# Patient Record
Sex: Female | Born: 1970
Health system: Southern US, Community
[De-identification: ages and names within clinical notes are randomized; demographics above are authoritative.]

## PROBLEM LIST (undated history)

## (undated) DIAGNOSIS — M549 Dorsalgia, unspecified: Secondary | ICD-10-CM

## (undated) DIAGNOSIS — G5603 Carpal tunnel syndrome, bilateral upper limbs: Secondary | ICD-10-CM

## (undated) DIAGNOSIS — M5137 Other intervertebral disc degeneration, lumbosacral region: Secondary | ICD-10-CM

## (undated) DIAGNOSIS — K08109 Complete loss of teeth, unspecified cause, unspecified class: Secondary | ICD-10-CM

## (undated) DIAGNOSIS — G43909 Migraine, unspecified, not intractable, without status migrainosus: Secondary | ICD-10-CM

## (undated) DIAGNOSIS — F319 Bipolar disorder, unspecified: Secondary | ICD-10-CM

## (undated) DIAGNOSIS — F32A Depression, unspecified: Secondary | ICD-10-CM

## (undated) DIAGNOSIS — E7800 Pure hypercholesterolemia, unspecified: Secondary | ICD-10-CM

## (undated) DIAGNOSIS — F411 Generalized anxiety disorder: Secondary | ICD-10-CM

## (undated) DIAGNOSIS — M75101 Unspecified rotator cuff tear or rupture of right shoulder, not specified as traumatic: Secondary | ICD-10-CM

## (undated) DIAGNOSIS — F39 Unspecified mood [affective] disorder: Secondary | ICD-10-CM

## (undated) DIAGNOSIS — F419 Anxiety disorder, unspecified: Secondary | ICD-10-CM

## (undated) DIAGNOSIS — G8929 Other chronic pain: Secondary | ICD-10-CM

## (undated) DIAGNOSIS — M792 Neuralgia and neuritis, unspecified: Secondary | ICD-10-CM

## (undated) DIAGNOSIS — M51379 Other intervertebral disc degeneration, lumbosacral region without mention of lumbar back pain or lower extremity pain: Secondary | ICD-10-CM

## (undated) DIAGNOSIS — Z972 Presence of dental prosthetic device (complete) (partial): Secondary | ICD-10-CM

## (undated) DIAGNOSIS — F609 Personality disorder, unspecified: Secondary | ICD-10-CM

## (undated) DIAGNOSIS — G709 Myoneural disorder, unspecified: Secondary | ICD-10-CM

## (undated) DIAGNOSIS — J189 Pneumonia, unspecified organism: Secondary | ICD-10-CM

## (undated) DIAGNOSIS — E785 Hyperlipidemia, unspecified: Secondary | ICD-10-CM

## (undated) DIAGNOSIS — M1990 Unspecified osteoarthritis, unspecified site: Secondary | ICD-10-CM

## (undated) DIAGNOSIS — F329 Major depressive disorder, single episode, unspecified: Secondary | ICD-10-CM

## (undated) DIAGNOSIS — F4310 Post-traumatic stress disorder, unspecified: Secondary | ICD-10-CM

## (undated) DIAGNOSIS — F4001 Agoraphobia with panic disorder: Secondary | ICD-10-CM

## (undated) HISTORY — PX: BREAST LUMPECTOMY: SHX2

## (undated) HISTORY — PX: DILATION AND CURETTAGE OF UTERUS: SHX78

---

## 1999-05-17 ENCOUNTER — Emergency Department (HOSPITAL_COMMUNITY): Admission: EM | Admit: 1999-05-17 | Discharge: 1999-05-17 | Payer: Self-pay | Admitting: Emergency Medicine

## 1999-05-17 ENCOUNTER — Encounter: Payer: Self-pay | Admitting: Emergency Medicine

## 2001-04-06 ENCOUNTER — Emergency Department (HOSPITAL_COMMUNITY): Admission: EM | Admit: 2001-04-06 | Discharge: 2001-04-07 | Payer: Self-pay | Admitting: Emergency Medicine

## 2001-04-19 ENCOUNTER — Emergency Department (HOSPITAL_COMMUNITY): Admission: EM | Admit: 2001-04-19 | Discharge: 2001-04-19 | Payer: Self-pay

## 2001-04-30 ENCOUNTER — Encounter: Payer: Self-pay | Admitting: Emergency Medicine

## 2001-04-30 ENCOUNTER — Ambulatory Visit (HOSPITAL_COMMUNITY): Admission: RE | Admit: 2001-04-30 | Discharge: 2001-04-30 | Payer: Self-pay | Admitting: Emergency Medicine

## 2001-10-18 ENCOUNTER — Emergency Department (HOSPITAL_COMMUNITY): Admission: EM | Admit: 2001-10-18 | Discharge: 2001-10-18 | Payer: Self-pay | Admitting: Emergency Medicine

## 2001-10-18 ENCOUNTER — Encounter: Payer: Self-pay | Admitting: Emergency Medicine

## 2002-03-15 ENCOUNTER — Emergency Department (HOSPITAL_COMMUNITY): Admission: EM | Admit: 2002-03-15 | Discharge: 2002-03-15 | Payer: Self-pay | Admitting: Emergency Medicine

## 2002-04-24 ENCOUNTER — Emergency Department (HOSPITAL_COMMUNITY): Admission: EM | Admit: 2002-04-24 | Discharge: 2002-04-24 | Payer: Self-pay | Admitting: Emergency Medicine

## 2002-04-27 ENCOUNTER — Emergency Department (HOSPITAL_COMMUNITY): Admission: EM | Admit: 2002-04-27 | Discharge: 2002-04-27 | Payer: Self-pay | Admitting: Emergency Medicine

## 2002-04-28 ENCOUNTER — Emergency Department (HOSPITAL_COMMUNITY): Admission: EM | Admit: 2002-04-28 | Discharge: 2002-04-28 | Payer: Self-pay | Admitting: Emergency Medicine

## 2002-05-21 ENCOUNTER — Emergency Department (HOSPITAL_COMMUNITY): Admission: EM | Admit: 2002-05-21 | Discharge: 2002-05-21 | Payer: Self-pay | Admitting: Emergency Medicine

## 2002-09-04 ENCOUNTER — Emergency Department (HOSPITAL_COMMUNITY): Admission: EM | Admit: 2002-09-04 | Discharge: 2002-09-04 | Payer: Self-pay | Admitting: Emergency Medicine

## 2002-09-07 ENCOUNTER — Emergency Department (HOSPITAL_COMMUNITY): Admission: EM | Admit: 2002-09-07 | Discharge: 2002-09-08 | Payer: Self-pay | Admitting: Emergency Medicine

## 2003-03-21 ENCOUNTER — Emergency Department (HOSPITAL_COMMUNITY): Admission: EM | Admit: 2003-03-21 | Discharge: 2003-03-21 | Payer: Self-pay | Admitting: Emergency Medicine

## 2003-04-07 ENCOUNTER — Emergency Department (HOSPITAL_COMMUNITY): Admission: EM | Admit: 2003-04-07 | Discharge: 2003-04-07 | Payer: Self-pay | Admitting: Emergency Medicine

## 2003-05-12 ENCOUNTER — Emergency Department (HOSPITAL_COMMUNITY): Admission: EM | Admit: 2003-05-12 | Discharge: 2003-05-13 | Payer: Self-pay | Admitting: Emergency Medicine

## 2003-07-30 ENCOUNTER — Emergency Department (HOSPITAL_COMMUNITY): Admission: EM | Admit: 2003-07-30 | Discharge: 2003-07-31 | Payer: Self-pay | Admitting: Emergency Medicine

## 2003-11-30 ENCOUNTER — Emergency Department (HOSPITAL_COMMUNITY): Admission: EM | Admit: 2003-11-30 | Discharge: 2003-11-30 | Payer: Self-pay | Admitting: Emergency Medicine

## 2003-12-14 ENCOUNTER — Emergency Department (HOSPITAL_COMMUNITY): Admission: EM | Admit: 2003-12-14 | Discharge: 2003-12-14 | Payer: Self-pay | Admitting: Emergency Medicine

## 2004-11-01 ENCOUNTER — Inpatient Hospital Stay (HOSPITAL_COMMUNITY): Admission: AD | Admit: 2004-11-01 | Discharge: 2004-11-01 | Payer: Self-pay | Admitting: Obstetrics and Gynecology

## 2004-11-11 ENCOUNTER — Inpatient Hospital Stay (HOSPITAL_COMMUNITY): Admission: AD | Admit: 2004-11-11 | Discharge: 2004-11-11 | Payer: Self-pay | Admitting: Obstetrics and Gynecology

## 2004-11-14 ENCOUNTER — Inpatient Hospital Stay (HOSPITAL_COMMUNITY): Admission: AD | Admit: 2004-11-14 | Discharge: 2004-11-16 | Payer: Self-pay | Admitting: Obstetrics and Gynecology

## 2005-06-11 ENCOUNTER — Emergency Department (HOSPITAL_COMMUNITY): Admission: EM | Admit: 2005-06-11 | Discharge: 2005-06-11 | Payer: Self-pay | Admitting: Emergency Medicine

## 2005-10-09 HISTORY — PX: BREAST EXCISIONAL BIOPSY: SUR124

## 2006-03-08 ENCOUNTER — Encounter (INDEPENDENT_AMBULATORY_CARE_PROVIDER_SITE_OTHER): Payer: Self-pay | Admitting: *Deleted

## 2006-03-08 ENCOUNTER — Encounter: Admission: RE | Admit: 2006-03-08 | Discharge: 2006-03-08 | Payer: Self-pay | Admitting: Family Medicine

## 2006-03-14 ENCOUNTER — Ambulatory Visit (HOSPITAL_COMMUNITY): Admission: RE | Admit: 2006-03-14 | Discharge: 2006-03-14 | Payer: Self-pay | Admitting: General Surgery

## 2006-03-14 ENCOUNTER — Encounter: Admission: RE | Admit: 2006-03-14 | Discharge: 2006-03-14 | Payer: Self-pay | Admitting: General Surgery

## 2006-03-14 ENCOUNTER — Encounter (INDEPENDENT_AMBULATORY_CARE_PROVIDER_SITE_OTHER): Payer: Self-pay | Admitting: *Deleted

## 2006-03-14 HISTORY — PX: BREAST EXCISIONAL BIOPSY: SUR124

## 2006-04-05 ENCOUNTER — Emergency Department (HOSPITAL_COMMUNITY): Admission: EM | Admit: 2006-04-05 | Discharge: 2006-04-06 | Payer: Self-pay | Admitting: Emergency Medicine

## 2006-05-09 ENCOUNTER — Emergency Department (HOSPITAL_COMMUNITY): Admission: EM | Admit: 2006-05-09 | Discharge: 2006-05-09 | Payer: Self-pay | Admitting: Emergency Medicine

## 2006-07-06 ENCOUNTER — Ambulatory Visit: Payer: Self-pay | Admitting: Family Medicine

## 2006-07-26 ENCOUNTER — Ambulatory Visit: Payer: Self-pay | Admitting: Family Medicine

## 2006-08-01 ENCOUNTER — Encounter: Admission: RE | Admit: 2006-08-01 | Discharge: 2006-08-01 | Payer: Self-pay | Admitting: Family Medicine

## 2006-08-20 ENCOUNTER — Ambulatory Visit: Payer: Self-pay | Admitting: Family Medicine

## 2006-10-09 DIAGNOSIS — T8859XA Other complications of anesthesia, initial encounter: Secondary | ICD-10-CM

## 2006-10-09 HISTORY — DX: Other complications of anesthesia, initial encounter: T88.59XA

## 2006-10-22 ENCOUNTER — Ambulatory Visit: Payer: Self-pay | Admitting: Family Medicine

## 2006-11-06 ENCOUNTER — Ambulatory Visit (HOSPITAL_COMMUNITY): Admission: RE | Admit: 2006-11-06 | Discharge: 2006-11-06 | Payer: Self-pay | Admitting: Obstetrics

## 2006-11-12 ENCOUNTER — Ambulatory Visit (HOSPITAL_COMMUNITY): Admission: RE | Admit: 2006-11-12 | Discharge: 2006-11-12 | Payer: Self-pay | Admitting: Obstetrics

## 2006-11-12 ENCOUNTER — Encounter (INDEPENDENT_AMBULATORY_CARE_PROVIDER_SITE_OTHER): Payer: Self-pay | Admitting: *Deleted

## 2006-11-12 HISTORY — PX: DILATION AND CURETTAGE OF UTERUS: SHX78

## 2006-12-03 ENCOUNTER — Ambulatory Visit: Payer: Self-pay | Admitting: Family Medicine

## 2006-12-09 ENCOUNTER — Emergency Department (HOSPITAL_COMMUNITY): Admission: EM | Admit: 2006-12-09 | Discharge: 2006-12-09 | Payer: Self-pay | Admitting: Emergency Medicine

## 2006-12-17 ENCOUNTER — Emergency Department (HOSPITAL_COMMUNITY): Admission: EM | Admit: 2006-12-17 | Discharge: 2006-12-18 | Payer: Self-pay | Admitting: Emergency Medicine

## 2006-12-24 ENCOUNTER — Ambulatory Visit: Payer: Self-pay | Admitting: Family Medicine

## 2007-01-08 ENCOUNTER — Emergency Department (HOSPITAL_COMMUNITY): Admission: EM | Admit: 2007-01-08 | Discharge: 2007-01-09 | Payer: Self-pay | Admitting: Emergency Medicine

## 2007-01-09 ENCOUNTER — Inpatient Hospital Stay (HOSPITAL_COMMUNITY): Admission: AD | Admit: 2007-01-09 | Discharge: 2007-01-11 | Payer: Self-pay | Admitting: Obstetrics & Gynecology

## 2007-01-11 ENCOUNTER — Encounter (INDEPENDENT_AMBULATORY_CARE_PROVIDER_SITE_OTHER): Payer: Self-pay | Admitting: Specialist

## 2007-06-04 ENCOUNTER — Ambulatory Visit: Payer: Self-pay | Admitting: Family Medicine

## 2007-07-09 ENCOUNTER — Ambulatory Visit: Payer: Self-pay | Admitting: Family Medicine

## 2007-07-31 ENCOUNTER — Ambulatory Visit: Payer: Self-pay | Admitting: Family Medicine

## 2007-10-10 DIAGNOSIS — G8929 Other chronic pain: Secondary | ICD-10-CM

## 2007-10-10 HISTORY — DX: Other chronic pain: G89.29

## 2007-10-23 ENCOUNTER — Ambulatory Visit (HOSPITAL_COMMUNITY): Admission: RE | Admit: 2007-10-23 | Discharge: 2007-10-23 | Payer: Self-pay | Admitting: Obstetrics

## 2007-10-25 ENCOUNTER — Ambulatory Visit: Payer: Self-pay | Admitting: Family Medicine

## 2007-10-25 IMAGING — US US PELVIS COMPLETE MODIFY
1 series · 14 of 25 positions shown · non-contrast
Comparison: none

CLINICAL DATA: 35-year-old female with history of miscarriage 2 months and D&C on 11/12/06.
 TRANSABDOMINAL AND TRANSVAGINAL PELVIC ULTRASOUND:
TECHNIQUE: Both transabdominal and transvaginal ultrasound examinations of the pelvis were performed including evaluation of the uterus, ovaries, adnexal regions, and pelvic cul-de-sac.

[Series 1: unknown · 0.30mm/px · 14 of 45 slices shown]
[im 1/45]
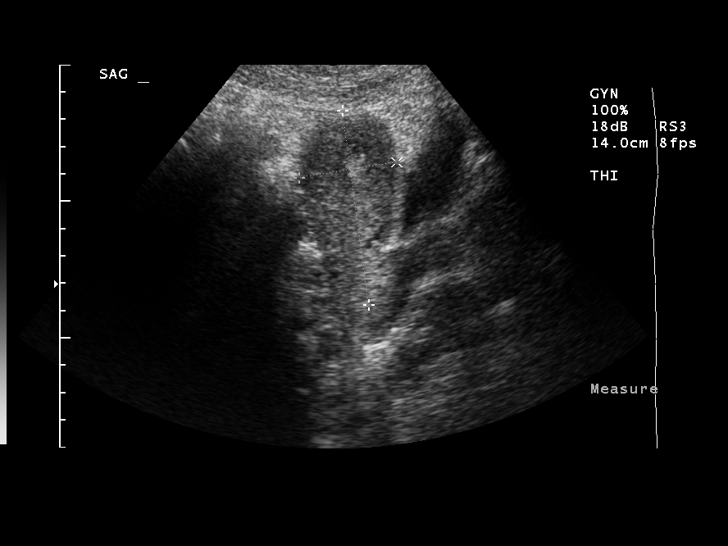
[im 4/45]
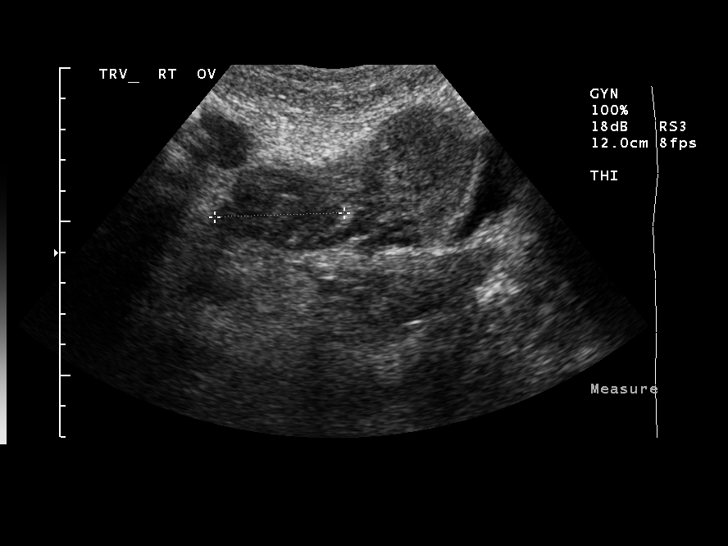
[im 8/45]
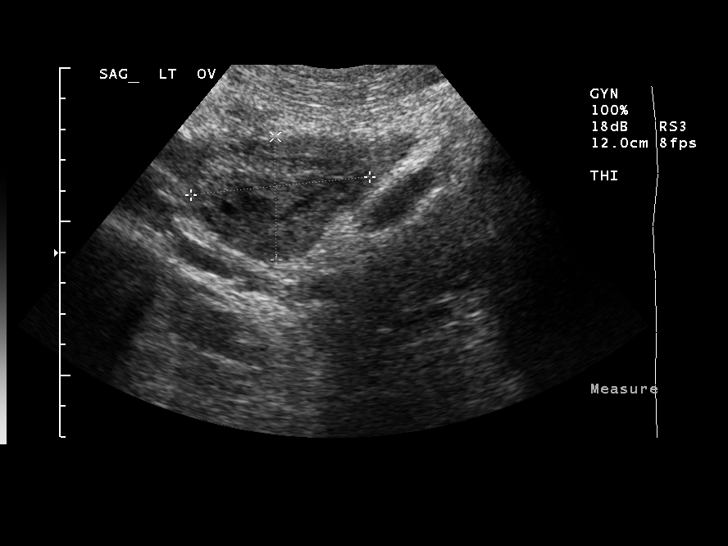
[im 12/45]
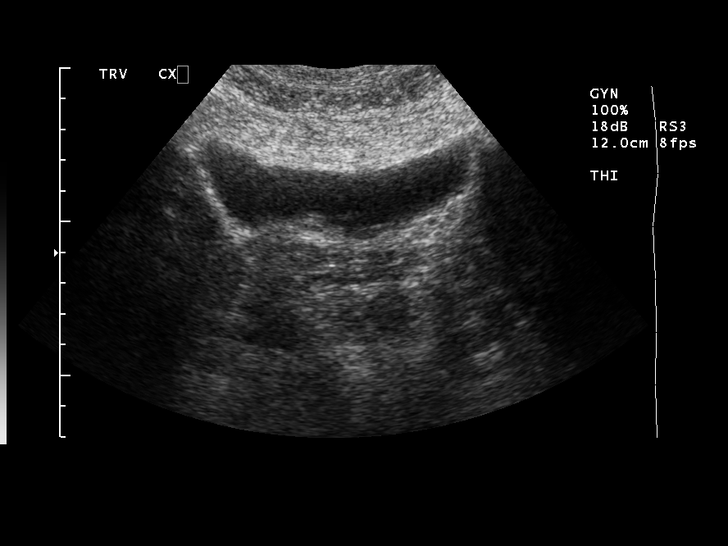
[im 15/45]
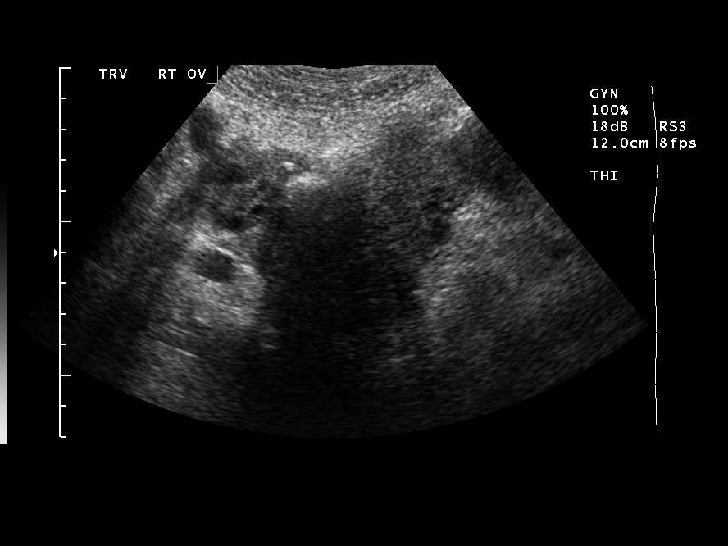
[im 17/45]
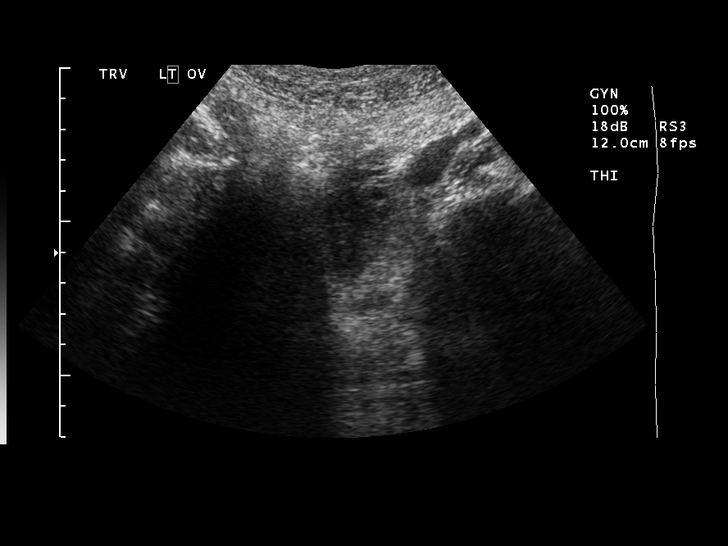
[im 21/45]
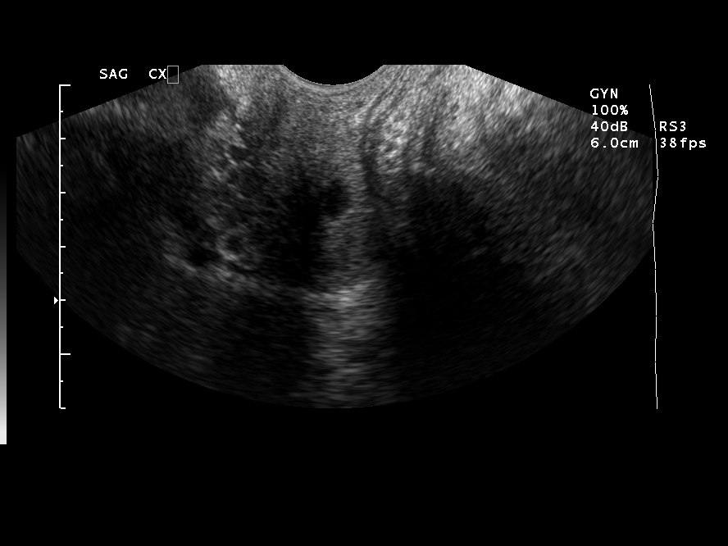
[im 24/45]
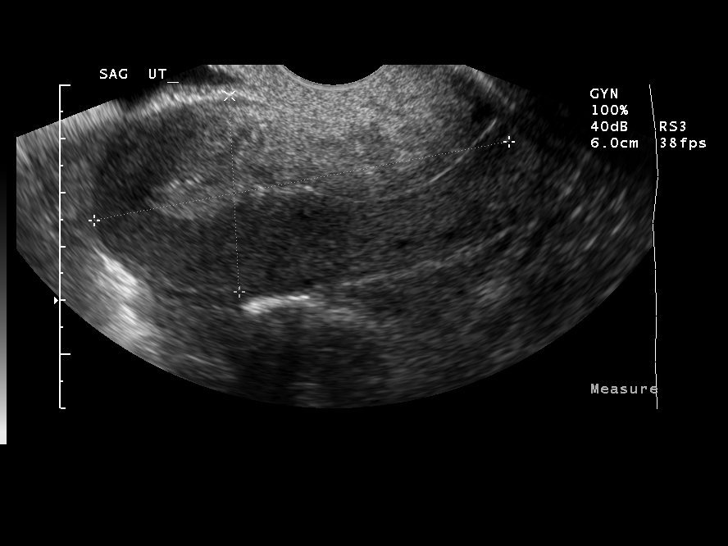
[im 28/45]
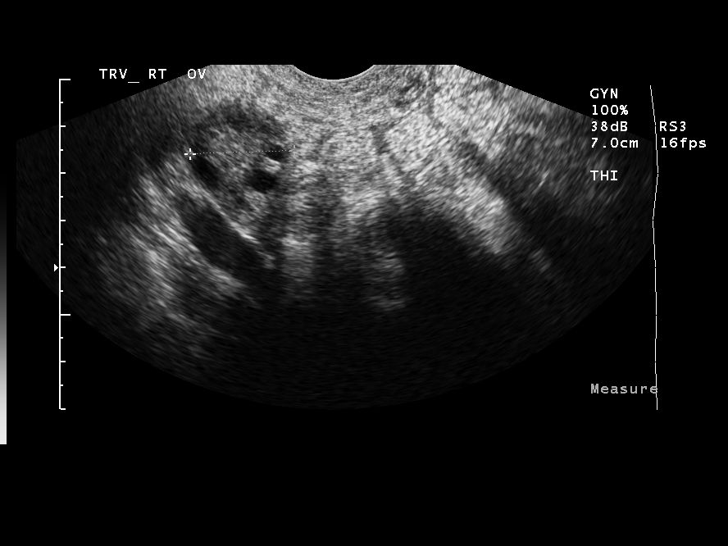
[im 30/45]
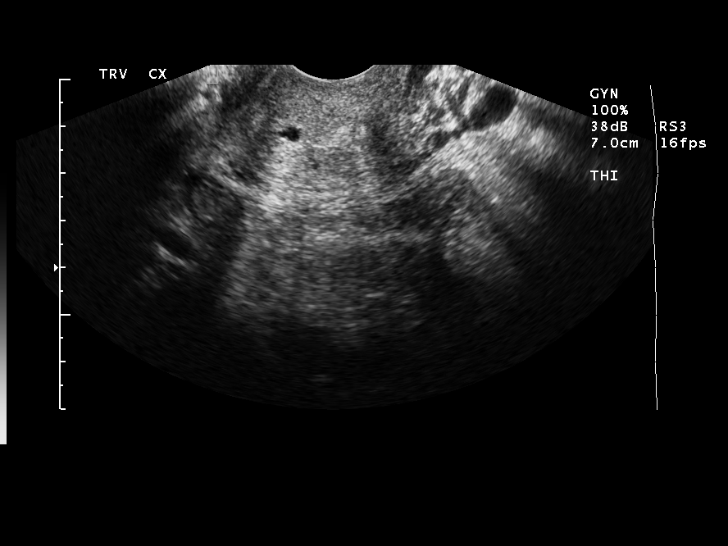
[im 34/45]
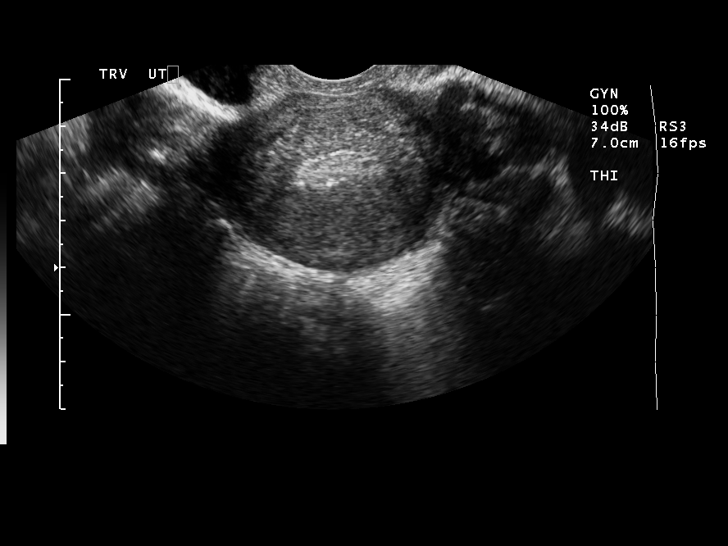
[im 37/45]
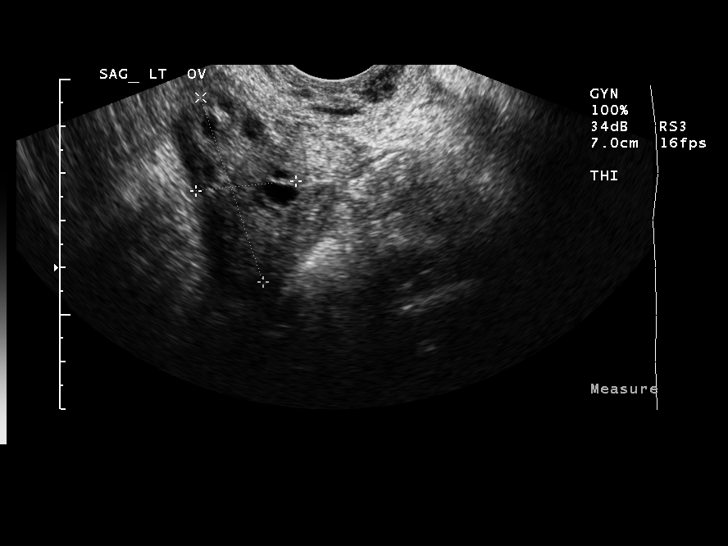
[im 41/45]
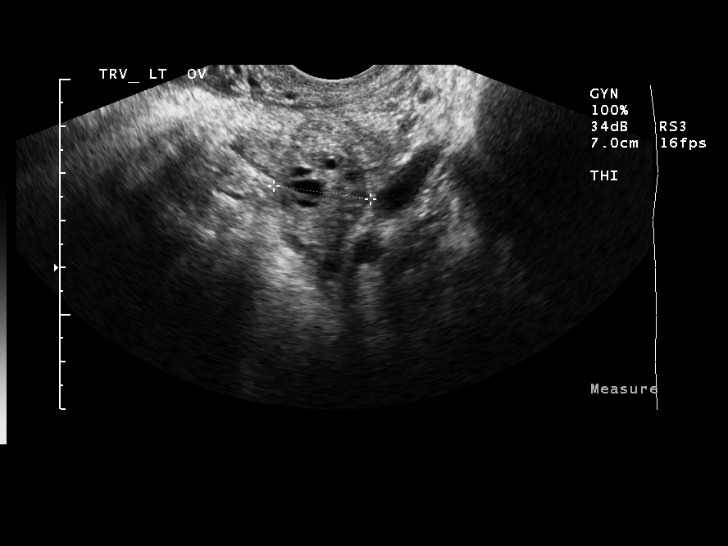
[im 45/45]
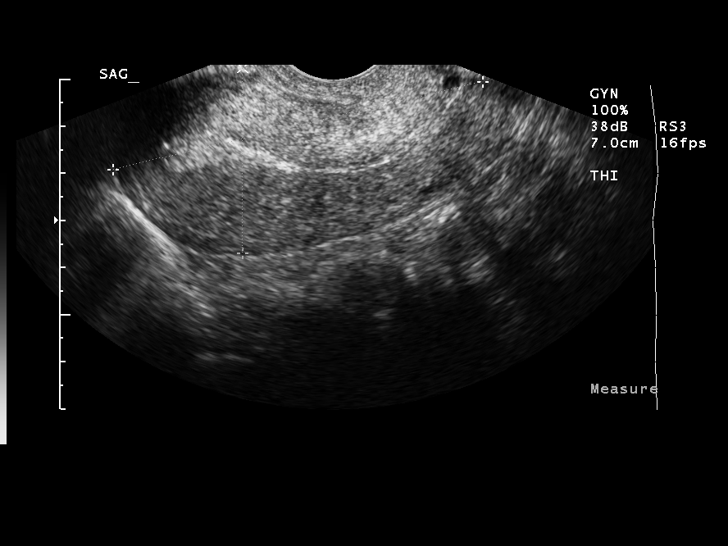

[14 of 25 positions shown; findings below may reference images not displayed]

FINDINGS: Uterus is anteverted measuring 7.2 x 4.5 x 5.6 cm.  Endometrial stripe is homogeneous measuring 6.5 mm in greatest diameter.  No uterine masses are identified.  The right ovary is unremarkable.  Left ovary is within normal limits.  No evidence of free fluid or adnexal masses.
IMPRESSION: Unremarkable pelvic ultrasound.

## 2008-01-08 ENCOUNTER — Observation Stay (HOSPITAL_COMMUNITY): Admission: AD | Admit: 2008-01-08 | Discharge: 2008-01-10 | Payer: Self-pay | Admitting: Obstetrics & Gynecology

## 2008-01-09 ENCOUNTER — Ambulatory Visit (HOSPITAL_COMMUNITY): Admission: RE | Admit: 2008-01-09 | Discharge: 2008-01-09 | Payer: Self-pay | Admitting: Obstetrics & Gynecology

## 2008-04-21 ENCOUNTER — Inpatient Hospital Stay (HOSPITAL_COMMUNITY): Admission: RE | Admit: 2008-04-21 | Discharge: 2008-05-09 | Payer: Self-pay | Admitting: Obstetrics & Gynecology

## 2008-04-24 ENCOUNTER — Ambulatory Visit (HOSPITAL_COMMUNITY): Admission: RE | Admit: 2008-04-24 | Discharge: 2008-04-24 | Payer: Self-pay | Admitting: Obstetrics & Gynecology

## 2008-05-07 ENCOUNTER — Encounter: Payer: Self-pay | Admitting: Obstetrics

## 2008-05-10 ENCOUNTER — Inpatient Hospital Stay (HOSPITAL_COMMUNITY): Admission: AD | Admit: 2008-05-10 | Discharge: 2008-05-10 | Payer: Self-pay | Admitting: Obstetrics

## 2008-05-13 ENCOUNTER — Emergency Department (HOSPITAL_COMMUNITY): Admission: EM | Admit: 2008-05-13 | Discharge: 2008-05-13 | Payer: Self-pay | Admitting: Emergency Medicine

## 2008-06-22 ENCOUNTER — Emergency Department (HOSPITAL_COMMUNITY): Admission: EM | Admit: 2008-06-22 | Discharge: 2008-06-22 | Payer: Self-pay | Admitting: Family Medicine

## 2008-07-01 ENCOUNTER — Emergency Department (HOSPITAL_COMMUNITY): Admission: EM | Admit: 2008-07-01 | Discharge: 2008-07-02 | Payer: Self-pay | Admitting: Emergency Medicine

## 2008-07-17 ENCOUNTER — Emergency Department (HOSPITAL_COMMUNITY): Admission: EM | Admit: 2008-07-17 | Discharge: 2008-07-17 | Payer: Self-pay | Admitting: Emergency Medicine

## 2008-07-31 ENCOUNTER — Emergency Department (HOSPITAL_COMMUNITY): Admission: EM | Admit: 2008-07-31 | Discharge: 2008-07-31 | Payer: Self-pay | Admitting: Emergency Medicine

## 2008-09-05 ENCOUNTER — Emergency Department (HOSPITAL_COMMUNITY): Admission: EM | Admit: 2008-09-05 | Discharge: 2008-09-05 | Payer: Self-pay | Admitting: Emergency Medicine

## 2009-01-25 ENCOUNTER — Emergency Department (HOSPITAL_COMMUNITY): Admission: EM | Admit: 2009-01-25 | Discharge: 2009-01-25 | Payer: Self-pay | Admitting: Family Medicine

## 2009-04-12 ENCOUNTER — Emergency Department (HOSPITAL_COMMUNITY): Admission: EM | Admit: 2009-04-12 | Discharge: 2009-04-12 | Payer: Self-pay | Admitting: Emergency Medicine

## 2009-04-27 ENCOUNTER — Emergency Department (HOSPITAL_COMMUNITY): Admission: EM | Admit: 2009-04-27 | Discharge: 2009-04-27 | Payer: Self-pay | Admitting: Family Medicine

## 2009-08-20 ENCOUNTER — Emergency Department (HOSPITAL_COMMUNITY): Admission: EM | Admit: 2009-08-20 | Discharge: 2009-08-20 | Payer: Self-pay | Admitting: Emergency Medicine

## 2009-12-02 ENCOUNTER — Emergency Department (HOSPITAL_COMMUNITY): Admission: EM | Admit: 2009-12-02 | Discharge: 2009-12-03 | Payer: Self-pay | Admitting: Emergency Medicine

## 2010-03-24 ENCOUNTER — Emergency Department (HOSPITAL_COMMUNITY): Admission: EM | Admit: 2010-03-24 | Discharge: 2010-03-24 | Payer: Self-pay | Admitting: Emergency Medicine

## 2010-04-01 ENCOUNTER — Emergency Department (HOSPITAL_COMMUNITY): Admission: EM | Admit: 2010-04-01 | Discharge: 2010-04-01 | Payer: Self-pay | Admitting: Emergency Medicine

## 2010-06-16 ENCOUNTER — Emergency Department (HOSPITAL_COMMUNITY)
Admission: EM | Admit: 2010-06-16 | Discharge: 2010-06-16 | Payer: Self-pay | Source: Home / Self Care | Admitting: Emergency Medicine

## 2010-06-17 ENCOUNTER — Emergency Department (HOSPITAL_COMMUNITY): Admission: EM | Admit: 2010-06-17 | Discharge: 2010-06-17 | Payer: Self-pay | Admitting: Emergency Medicine

## 2010-09-26 ENCOUNTER — Emergency Department (HOSPITAL_COMMUNITY)
Admission: EM | Admit: 2010-09-26 | Discharge: 2010-09-26 | Payer: Self-pay | Source: Home / Self Care | Admitting: Emergency Medicine

## 2010-10-25 ENCOUNTER — Emergency Department (HOSPITAL_COMMUNITY)
Admission: EM | Admit: 2010-10-25 | Discharge: 2010-10-26 | Payer: Self-pay | Source: Home / Self Care | Admitting: Emergency Medicine

## 2010-10-30 ENCOUNTER — Encounter: Payer: Self-pay | Admitting: Family Medicine

## 2010-11-20 ENCOUNTER — Emergency Department (HOSPITAL_COMMUNITY)
Admission: EM | Admit: 2010-11-20 | Discharge: 2010-11-20 | Disposition: A | Discharge status: Home / Self Care | Payer: Self-pay | Attending: Emergency Medicine | Admitting: Emergency Medicine

## 2010-11-20 DIAGNOSIS — M545 Low back pain, unspecified: Secondary | ICD-10-CM | POA: Insufficient documentation

## 2010-11-20 DIAGNOSIS — G8929 Other chronic pain: Secondary | ICD-10-CM | POA: Insufficient documentation

## 2010-11-20 DIAGNOSIS — S335XXA Sprain of ligaments of lumbar spine, initial encounter: Secondary | ICD-10-CM | POA: Insufficient documentation

## 2010-11-20 DIAGNOSIS — M546 Pain in thoracic spine: Secondary | ICD-10-CM | POA: Insufficient documentation

## 2010-11-20 DIAGNOSIS — M5380 Other specified dorsopathies, site unspecified: Secondary | ICD-10-CM | POA: Insufficient documentation

## 2010-11-20 DIAGNOSIS — Y929 Unspecified place or not applicable: Secondary | ICD-10-CM | POA: Insufficient documentation

## 2010-11-20 DIAGNOSIS — M62838 Other muscle spasm: Secondary | ICD-10-CM | POA: Insufficient documentation

## 2010-11-20 DIAGNOSIS — X503XXA Overexertion from repetitive movements, initial encounter: Secondary | ICD-10-CM | POA: Insufficient documentation

## 2010-11-20 LAB — URINALYSIS, ROUTINE W REFLEX MICROSCOPIC
Leukocytes, UA: NEGATIVE
Nitrite: NEGATIVE
Specific Gravity, Urine: 1.004 — ABNORMAL LOW (ref 1.005–1.030)
pH: 6 (ref 5.0–8.0)

## 2010-11-20 LAB — POCT PREGNANCY, URINE: Preg Test, Ur: NEGATIVE

## 2010-11-20 LAB — URINE MICROSCOPIC-ADD ON

## 2010-12-25 LAB — URINALYSIS, ROUTINE W REFLEX MICROSCOPIC
Bilirubin Urine: NEGATIVE
Protein, ur: NEGATIVE mg/dL
Urobilinogen, UA: 1 mg/dL (ref 0.0–1.0)

## 2010-12-25 LAB — POCT PREGNANCY, URINE: Preg Test, Ur: NEGATIVE

## 2010-12-25 LAB — URINE MICROSCOPIC-ADD ON

## 2010-12-28 LAB — URINALYSIS, ROUTINE W REFLEX MICROSCOPIC
Ketones, ur: NEGATIVE mg/dL
Nitrite: NEGATIVE
Protein, ur: NEGATIVE mg/dL
Urobilinogen, UA: 0.2 mg/dL (ref 0.0–1.0)
pH: 7.5 (ref 5.0–8.0)

## 2010-12-28 LAB — POCT I-STAT, CHEM 8
Calcium, Ion: 1.13 mmol/L (ref 1.12–1.32)
Chloride: 106 mEq/L (ref 96–112)
HCT: 41 % (ref 36.0–46.0)
Hemoglobin: 13.9 g/dL (ref 12.0–15.0)
Potassium: 4 mEq/L (ref 3.5–5.1)

## 2010-12-28 LAB — HEMOCCULT GUIAC POC 1CARD (OFFICE): Fecal Occult Bld: POSITIVE

## 2011-01-16 ENCOUNTER — Other Ambulatory Visit: Payer: Self-pay | Admitting: Internal Medicine

## 2011-01-16 DIAGNOSIS — G8929 Other chronic pain: Secondary | ICD-10-CM

## 2011-01-17 ENCOUNTER — Ambulatory Visit
Admission: RE | Admit: 2011-01-17 | Discharge: 2011-01-17 | Disposition: A | Discharge status: Home / Self Care | Payer: Self-pay | Source: Ambulatory Visit | Attending: Internal Medicine | Admitting: Internal Medicine

## 2011-01-17 DIAGNOSIS — M542 Cervicalgia: Secondary | ICD-10-CM

## 2011-01-17 DIAGNOSIS — G8929 Other chronic pain: Secondary | ICD-10-CM

## 2011-01-18 LAB — DIFFERENTIAL
Basophils Relative: 2 % — ABNORMAL HIGH (ref 0–1)
Eosinophils Absolute: 0.1 10*3/uL (ref 0.0–0.7)
Lymphs Abs: 1.4 10*3/uL (ref 0.7–4.0)
Monocytes Relative: 7 % (ref 3–12)
Neutro Abs: 4 10*3/uL (ref 1.7–7.7)
Neutrophils Relative %: 67 % (ref 43–77)

## 2011-01-18 LAB — POCT I-STAT, CHEM 8
Chloride: 105 mEq/L (ref 96–112)
Creatinine, Ser: 0.9 mg/dL (ref 0.4–1.2)
Glucose, Bld: 83 mg/dL (ref 70–99)
HCT: 41 % (ref 36.0–46.0)
Hemoglobin: 13.9 g/dL (ref 12.0–15.0)
Potassium: 4 mEq/L (ref 3.5–5.1)
Sodium: 143 mEq/L (ref 135–145)

## 2011-01-18 LAB — WET PREP, GENITAL: Trich, Wet Prep: NONE SEEN

## 2011-01-18 LAB — CBC
MCV: 94.3 fL (ref 78.0–100.0)
Platelets: 178 10*3/uL (ref 150–400)
RBC: 4.15 MIL/uL (ref 3.87–5.11)
WBC: 5.9 10*3/uL (ref 4.0–10.5)

## 2011-01-18 LAB — POCT URINALYSIS DIP (DEVICE)
Glucose, UA: NEGATIVE mg/dL
Nitrite: NEGATIVE
Protein, ur: 30 mg/dL — AB
Specific Gravity, Urine: 1.025 (ref 1.005–1.030)
Urobilinogen, UA: 0.2 mg/dL (ref 0.0–1.0)

## 2011-01-18 LAB — POCT PREGNANCY, URINE: Preg Test, Ur: NEGATIVE

## 2011-02-15 ENCOUNTER — Ambulatory Visit (INDEPENDENT_AMBULATORY_CARE_PROVIDER_SITE_OTHER): Payer: Self-pay

## 2011-02-15 ENCOUNTER — Inpatient Hospital Stay (INDEPENDENT_AMBULATORY_CARE_PROVIDER_SITE_OTHER)
Admission: RE | Admit: 2011-02-15 | Discharge: 2011-02-15 | Disposition: A | Discharge status: Home / Self Care | Payer: Self-pay | Source: Ambulatory Visit | Attending: Family Medicine | Admitting: Family Medicine

## 2011-02-15 DIAGNOSIS — J189 Pneumonia, unspecified organism: Secondary | ICD-10-CM

## 2011-02-21 NOTE — Op Note (Signed)
Carrie [name], [name]              ACCOUNT NO.:  [id number]   MEDICAL RECORD NO.:  [id number]           PATIENT TYPE:   LOCATION:                                FACILITY:  [facility]   PHYSICIAN:  Charles A. Clearance Coots, M.D.DATE OF BIRTH:  [birth year]   DATE OF PROCEDURE:  DATE OF DISCHARGE:  05/09/2008                               OPERATIVE REPORT   PREOPERATIVE DIAGNOSES:  1. A 36 weeks' gestation.  2. Complete placenta previa.  3. Desires sterilization.   POSTOPERATIVE DIAGNOSES:  1. A 36 weeks' gestation.  2. Complete placenta previa.  3. Desires sterilization.   PROCEDURES:  1. Primary low-transverse cesarean section.  2. Bilateral partial salpingectomy.   SURGEON:  Charles A. Clearance Coots, MD   ASSISTANT:  Roseanna Rainbow, MD   ANESTHESIA:  Spinal.   ESTIMATED BLOOD LOSS:  1400 mL.   IV FLUIDS:  2150 mL.   URINE OUTPUT:  125 mL.   COMPLICATIONS:  None.   DRAINS:  Foley to gravity.   FINDINGS:  Viable female at 09:28, Apgars of 8 at 1 minute and 9 at 5  minutes, and weight of 5 pounds 13 ounces.  Normal uterus, ovaries, and  fallopian tubes   SPECIMEN:  Approximately 2 cm segments of left and right fallopian tubes  and placenta.   DISPOSITION:  Specimen to pathology.   OPERATION:  The patient was brought to the operating room and after  satisfactory spinal anesthesia, the abdomen was prepped and draped in  the usual sterile fashion with indwelling Foley catheter to gravity  drainage.  Pfannenstiel skin incision was made with the scalpel that was  deepened down to the fascia with a scalpel.  Fascia was nicked in the  midline and the fascial incision was extended to the left and to the  right with curved Mayo scissors.  The superior and inferior fascial  edges were taken off the rectus muscles both blunt and sharp dissection.  The rectus muscle was bluntly and sharply divided in the midline and  peritoneum was entered digitally, and was digitally extended to the  left  and to the right.  The bladder blade was positioned.  The vesicouterine  fold of peritoneum, above the reflection of the urinary bladder was  grasped with forceps and was incised and undermined with Metzenbaum  scissors.  The incision was extended to the left and to the right with  Metzenbaum scissors.  The bladder flap was then bluntly developed and  the bladder blade was repositioned in front of the urinary bladder  placing it well out of the operative field.  The uterus was then entered  transversely in the lower uterine segment with a scalpel.  The uterine  incision was then extended to the left and to the right digitally.  The  amniotic sac was ruptured and clear fluid was expelled.  The vertex was  not well engaged and was hyperextended and the decision was made to  deliver with the aid of vacuum extractor.  The Mityvac mushroom VAC was  applied to the occiput, which was flexed into the incision and the  vertex was then delivered with the aid of fundal pressure and one pull  of the vacuum extractor.  The infant's mouth and nose were suctioned  with a suction bulb and the delivery was completed with the aid of  fundal pressure from the assistant.  The umbilical cord was doubly  clamped and cut and infant was handed off to the nursery staff.  The  cord blood was obtained and the placenta was manually removed from the  uterus intact.  The placenta was tightly adherent to the endometrial  surface in the lower uterine segment, but it was felt that all of the  placenta was removed intact.  The endometrial surface was then  thoroughly debrided with a dry lap sponge.  The edges of the uterine  incision were grasped with ring forceps.  The uterus was closed with a  continuous interlocking suture of 0 Monocryl from each corner to the  center.  Hemostasis was excellent.  Attention was then turned above to  the tubal sterilization procedure.  The left fallopian tube was grasped  in the  isthmic area with Babcock clamp and the knuckle of tube beneath  the Babcock clamp was suture ligated with 0 plain catgut and the section  of tube above the knot was excised with Metzenbaum scissors and  submitted to pathology for evaluation.  There was no active bleeding  from the tubal stumps.  The same procedure was performed on the opposite  side without complications.  The pelvic cavity was then thoroughly  irrigated with warm saline solution.  The uterus had been exteriorized  and was then placed back in its normal anatomic position.  The incision  closure was again visualized for hemostasis and there was no active  bleeding.  The abdomen was then closed as follows.  Peritoneum was  closed with continuous suture of 2-0 Monocryl.  The fascia was closed  with continuous suture of 0 Vicryl.  The subcutaneous tissue was  thoroughly irrigated with warm saline solution.  All areas of  subcutaneous bleeding were coagulated with the Bovie.  The skin was then  closed with a continuous subcuticular suture of 4-0 Monocryl.  A sterile  bandage was applied to the incision closure.  Surgical technician  indicated that all needle, sponge, and instrument counts were correct  x2.  The patient tolerated procedure well and transported to recovery  room in satisfactory condition.      Charles A. Clearance Coots, M.D.  Electronically Signed     CAH/MEDQ  D:  05/07/2008  T:  05/07/2008  Job:  [id number]

## 2011-02-24 NOTE — Discharge Summary (Signed)
Carrie [name], Carrie [name]              ACCOUNT NO.:  [id number]   MEDICAL RECORD NO.:  [id number]          PATIENT TYPE:  INP   LOCATION:  9105                          FACILITY:  [facility]   PHYSICIAN:  Charles A. Clearance Coots, M.D.DATE OF BIRTH:  27-May-1971   DATE OF ADMISSION:  04/21/2008  DATE OF DISCHARGE:  05/09/2008                               DISCHARGE SUMMARY   ADMITTING DIAGNOSES:  1. [redacted] weeks gestation.  2. Placenta previa.  3. Marginal abruption.   DISCHARGE DIAGNOSES:  1. [redacted] weeks gestation.  2. Placenta previa.  3. Marginal abruption.  4. Status post primary low-transverse cesarean section and bilateral      partial salpingectomy at [redacted] weeks gestation for complete placenta      previa, viable female infant was delivered on May 07, 2008 at      0928, Apgars of 8 at one minute and 9 at five minutes, weight of 5      pounds 13 ounces.  Mother and infant discharged home in good      condition.   REASON FOR ADMISSION:  A 40 year old G7, P4, estimated date of  confinement was June 03, 2008, history of complete placenta previa  with this pregnancy presented to [facility] with vaginal bleeding.   PAST MEDICAL HISTORY:  1. Surgery, D&C, oral surgery.  2. Illnesses, migraine.   MEDICATIONS:  Prenatal vitamins.   ALLERGIES:  No known drug allergies.   SOCIAL HISTORY:  Married.  Positive tobacco and negative alcohol or  recreational drug use.  Works as a Animator.   FAMILY HISTORY:  Positive for diabetes, depression, and breast cancer.   PHYSICAL EXAMINATION:  GENERAL:  A well-nourished and well-developed  female in no acute distress, afebrile.  VITAL SIGNS:  Stable.  LUNGS:  Clear to auscultation bilaterally.  HEART:  Regular rate and rhythm.  ABDOMEN:  Gravid, nontender.  GENITALIA:  Vaginal examination revealed moderate amount of bright red  bleeding.   ADMITTING LABS:  Hemoglobin 11, hematocrit 33, white blood cell count  8800, and platelets  177,000.  Comprehensive metabolic panel was within  normal limits.  Urine drug screen was positive for cannabinoids.   HOSPITAL COURSE:  The patient was admitted and vaginal bleeding ceased  over the first several hours of admission and fetal monitoring was  reassuring.  Maternal fetal medicine consultation was then obtained and  recommendation was for delivery at [redacted] weeks gestation.  Ultrasound  continued to show complete placenta previa.  There was no evidence of  retroplacental clotting or placental abnormalities except for complete  placenta previa.  Amniotic fluid volume was normal.  The patient was  placed on complete bedrest and did well.  She was taken to the operating  room at [redacted] weeks gestation for repeat cesarean section and bilateral  tubal ligation.  A repeat low transverse cesarean section and bilateral  partial salpingectomy was performed on May 07, 2008.  There were no  intraoperative complications.  Postoperative course was uncomplicated.  The patient was discharged home on postop day #2 in good condition.   DISCHARGE LABS:  Hemoglobin 9.6,  hematocrit 28, white blood cell count  6800, and platelets 156,000.   DISCHARGE DISPOSITION:  Percocet and ibuprofen was prescribed for pain.  Continue prenatal vitamins.  Routine written instructions were given for  discharge after cesarean section.  The patient is to call the office for  a followup appointment in 2 weeks.      Charles A. Clearance Coots, M.D.  Electronically Signed     CAH/MEDQ  D:  06/05/2008  T:  06/06/2008  Job:  [id number]

## 2011-02-24 NOTE — H&P (Signed)
Carrie [name], Carrie [name]              ACCOUNT NO.:  [id number]   MEDICAL RECORD NO.:  [id number]          PATIENT TYPE:  INP   LOCATION:  9169                          FACILITY:  [facility]   PHYSICIAN:  Lenoard Aden, M.D.DATE OF BIRTH:  03-26-1971   DATE OF ADMISSION:  11/14/2004  DATE OF DISCHARGE:                                HISTORY & PHYSICAL   CHIEF COMPLAINT:  Unexplained third trimester bleeding.   HISTORY OF PRESENT ILLNESS:  This 40 year old white female G5, P3-0-1-3 has  a history of vaginal delivery x3, ectopic x1 who presents with multiple  episodes of third trimester spotting for induction now at 39 weeks.  She has  an obstetric history contributory for three vaginal deliveries and one  ectopic, reportedly without complications and with no tubal removal.  She  has a history of Syphilis which was treated, a history of irritable bowel  syndrome.  No other medical or surgical hospitalizations.   FAMILY HISTORY:  Family history of heart disease and diabetes.   She also has a remote history of migraine headaches and a history of a  childhood seizure disorder.   PRENATAL LABORATORY DATA:  Reveals a blood type of O+, RH antibody negative,  Rubella immune, hepatitis negative, GBS is negative.  Pregnancy complicated  by unexplained third trimester bleeding with normal sonographic  surveillance.  Normal anterior ultrasound noted on sonogram in the third  trimester.   PHYSICAL EXAMINATION:  GENERAL:  On physical examination, she is a well-  developed, well-nourished white female in no acute distress.  HEENT:  Normal.  LUNGS:  Clear.  HEART:  A regular rate and rhythm.  ABDOMEN:  Soft, gravid and nontender.  Estimated fetal weight is 7.5 pounds.  Cervix is 2-3 cm, 70% vertex, -1.  EXTREMITIES/NEUROLOGIC:  Exams are nonfocal.   IMPRESSION:  Term intra-uterine pregnancy with unexplained third trimester  bleeding with favorable cervix for induction.   PLAN:  Proceed with  induction, watch bleeding patterns, external fetal  monitoring noted.  Anticipate attempted vaginal delivery.      RJT/MEDQ  D:  11/14/2004  T:  11/14/2004  Job:  [id number]

## 2011-02-24 NOTE — Op Note (Signed)
NAMEBARRI, NEIDLINGER              ACCOUNT NO.:  [id number]   MEDICAL RECORD NO.:  [id number]          PATIENT TYPE:  INP   LOCATION:  9303                          FACILITY:  [facility]   PHYSICIAN:  Charles A. Clearance Coots, M.D.DATE OF BIRTH:  07-13-1971   DATE OF PROCEDURE:  01/11/2007  DATE OF DISCHARGE:                               OPERATIVE REPORT   PREOPERATIVE DIAGNOSIS:  Hematometra after suction dilation and  evacuation.   POSTOPERATIVE DIAGNOSIS:  Hematometra after suction dilation and  evacuation.   PROCEDURE:  Dilation and evacuation of hematometra.   SURGEON:  Coral Ceo, M.D.   ANESTHESIA:  General.   ESTIMATED BLOOD LOSS:  Negligible.   COMPLICATIONS:  None.   SPECIMEN:  Blood clot.   FINDINGS:  Synechia at the internal os of cervix with hematometra.   OPERATION:  The patient was brought to the operating room, and after  satisfactory general endotracheal anesthesia, the vagina was prepped and  draped in usual sterile fashion.  The urinary bladder was emptied of  1050 mL of clear urine.  The bimanual exam revealed the uterus to be mid  position.  Sterile speculum was inserted in the vaginal vault, and  anterior lip of surface was grasped with a single-tooth tenaculum.  The  uterus was sounded, and upon sounding the uterus, dark blood with clot  was discharged from the cervical os.  The cervix was then dilated to a  #21 Pratt dilator, and dark blood continued to come from the cervical  opening.  A #8 suction catheter was easily introduced into the uterine  cavity and the remainder of the blood and clot was evacuated.  All  instruments were then retired.  The patient tolerated the procedure well  and was transported to recovery room in satisfactory condition.      Charles A. Clearance Coots, M.D.  Electronically Signed     CAH/MEDQ  D:  01/11/2007  T:  01/11/2007  Job:  [id number]

## 2011-02-24 NOTE — Op Note (Signed)
NAMEKATHLEN, Carrie [name]              ACCOUNT NO.:  [id number]   MEDICAL RECORD NO.:  [id number]          PATIENT TYPE:  AMB   LOCATION:  SDC                           [facility]   PHYSICIAN:  Charles A. Clearance Coots, M.D.DATE OF BIRTH:  1971-09-03   DATE OF PROCEDURE:  11/12/2006  DATE OF DISCHARGE:                               OPERATIVE REPORT   PREOPERATIVE DIAGNOSIS:  Missed abortion on first trimester ultrasound.   POSTOPERATIVE DIAGNOSIS:  Missed abortion on first trimester ultrasound.   PROCEDURE:  Suction dilation and evacuation.   SURGEON:  Charles A. Clearance Coots, M.D.   ANESTHESIA:  MAC with paracervical block.   SPECIMEN:  Very scanty blood and no obvious products of conception seen.   ESTIMATED BLOOD LOSS:  Minimal.   COMPLICATIONS:  None.   FINDINGS:  Very scanty products of conception at [facility], possibly passed  prior to procedure.   OPERATION:  The patient was brought to the operating room and after  satisfactory IV sedation, the legs were brought up in stirrups and the  vagina was prepped and draped in usual sterile fashion.  Urinary bladder  was emptied of approximately 25 mL of clear urine.  Bimanual examination  revealed the uterus to be mid position, slightly enlarged.  Sterile  speculum was inserted in the vaginal vault and the cervix was isolated.  Anterior lip of the cervix was grasped with a single-tooth tenaculum.  The uterus was sounded.  The cervix was dilated to a 27 Pratt dilator, a  #8 suction cath was easily introduced into the uterine cavity and the  cavity felt very gritty as the procedure was begun and just scant amount  of blood and no identifiable tissue could be seen within the catheter.  The endometrial surface was thoroughly curetted with a medium sharp  curette and no further products of conception were obtained.  The  suction cath was then again introduced into the uterine cavity and just  scant amount of blood and possible tissue was obtained  and submitted to  pathology for evaluation.  It was assumed that the patient probably had  past the gestational sac prior to the procedure with very little  products of conception remaining.  There is no active bleeding at the  conclusion of the procedure.  All instruments were retired.  The patient  tolerated the procedure well, was transported to the recovery room in  satisfactory condition.      Charles A. Clearance Coots, M.D.  Electronically Signed    CAH/MEDQ  D:  11/12/2006  T:  11/12/2006  Job:  [id number]

## 2011-02-24 NOTE — Op Note (Signed)
NAMEJENNIFR, Carrie [name]              ACCOUNT NO.:  [id number]   MEDICAL RECORD NO.:  [id number]          PATIENT TYPE:  AMB   LOCATION:  SDS                          FACILITY:  [facility]   PHYSICIAN:  Ollen Gross. Vernell Morgans, M.D. DATE OF BIRTH:  Oct 07, 1971   DATE OF PROCEDURE:  03/14/2006  DATE OF DISCHARGE:                                 OPERATIVE REPORT   PREOPERATIVE DIAGNOSIS:  Right breast atypical ductal hyperplasia.   POSTOPERATIVE DIAGNOSIS:  Right breast atypical ductal hyperplasia.   PROCEDURE:  Right breast needle localized lumpectomy.   SURGEON:  Ollen Gross. Carolynne Edouard, M.D.   ANESTHESIA:  General via LMA.   PROCEDURE:  After informed consent was obtained, the patient was brought to  the operating placed and placed in the supine position on the operating room  table.  After adequate induction of general anesthesia, the patient's right  breast was prepped with Betadine and draped in the usual sterile manner.  Earlier in the day, the patient had a needle localizing procedure and the  wire was entering the right breast medially at about the 3 o'clock position.  An elliptical incision was made overlying the path of the wire.  This  incision was carried down through the skin and subcutaneous tissues sharply  with the electrocautery.  A circular amount of breast tissue was removed  sharply around the path of the wire.  This was done sharply with  electrocautery.  Once this mass of tissue was removed, it was sent to the  radiologist to x-ray the specimen and the mass and clip were within the  specimen.  Hemostasis was achieved using Bovie electrocautery.  The wound  was irrigated with copious amounts of saline.  The wound was then closed  with running 4-0 Monocryl subcuticular stitch.  Benzoin and Steri-Strips  were applied.  Sterile dressings were applied.  The patient tolerated the  procedure well.  At the end of the case, all needle, sponge, and instrument  counts were correct.  The patient  was then awakened and taken to the  recovery room in stable condition.      Ollen Gross. Vernell Morgans, M.D.  Electronically Signed     PST/MEDQ  D:  03/14/2006  T:  03/14/2006  Job:  [id number]

## 2011-06-03 ENCOUNTER — Emergency Department (HOSPITAL_COMMUNITY)
Admission: EM | Admit: 2011-06-03 | Discharge: 2011-06-03 | Disposition: A | Discharge status: Home / Self Care | Payer: Self-pay | Attending: Emergency Medicine | Admitting: Emergency Medicine

## 2011-06-03 DIAGNOSIS — M25529 Pain in unspecified elbow: Secondary | ICD-10-CM | POA: Insufficient documentation

## 2011-06-03 DIAGNOSIS — M7020 Olecranon bursitis, unspecified elbow: Secondary | ICD-10-CM | POA: Insufficient documentation

## 2011-06-20 ENCOUNTER — Emergency Department (HOSPITAL_COMMUNITY)
Admission: EM | Admit: 2011-06-20 | Discharge: 2011-06-20 | Disposition: A | Discharge status: Home / Self Care | Payer: Self-pay | Attending: Emergency Medicine | Admitting: Emergency Medicine

## 2011-06-20 DIAGNOSIS — H9209 Otalgia, unspecified ear: Secondary | ICD-10-CM | POA: Insufficient documentation

## 2011-06-20 DIAGNOSIS — H60399 Other infective otitis externa, unspecified ear: Secondary | ICD-10-CM | POA: Insufficient documentation

## 2011-07-04 LAB — DIFFERENTIAL
Eosinophils Relative: 2
Lymphocytes Relative: 19
Lymphs Abs: 1.7
Monocytes Absolute: 0.5
Monocytes Relative: 6

## 2011-07-04 LAB — URINALYSIS, ROUTINE W REFLEX MICROSCOPIC
Bilirubin Urine: NEGATIVE
Glucose, UA: NEGATIVE
Ketones, ur: NEGATIVE
Protein, ur: NEGATIVE
pH: 5.5

## 2011-07-04 LAB — CBC
HCT: 32.4 — ABNORMAL LOW
HCT: 32.4 — ABNORMAL LOW
Hemoglobin: 11.4 — ABNORMAL LOW
MCHC: 34.1
MCV: 91.5
MCV: 91.6
Platelets: 206
RBC: 3.54 — ABNORMAL LOW
RDW: 12.8
WBC: 8.8
WBC: 9

## 2011-07-04 LAB — URINE MICROSCOPIC-ADD ON

## 2011-07-06 LAB — CBC
HCT: 33.3 — ABNORMAL LOW
Platelets: 177
RDW: 12.9

## 2011-07-06 LAB — URINALYSIS, ROUTINE W REFLEX MICROSCOPIC
Glucose, UA: NEGATIVE
Hgb urine dipstick: NEGATIVE
Specific Gravity, Urine: 1.02

## 2011-07-06 LAB — COMPREHENSIVE METABOLIC PANEL
Albumin: 2.4 — ABNORMAL LOW
Alkaline Phosphatase: 113
BUN: 3 — ABNORMAL LOW
GFR calc Af Amer: >60
Potassium: 3.6
Total Protein: 5.4 — ABNORMAL LOW

## 2011-07-07 LAB — CROSSMATCH
ABO/RH(D): O POS
Antibody Screen: NEGATIVE

## 2011-07-07 LAB — RAPID URINE DRUG SCREEN, HOSP PERFORMED
Amphetamines: NOT DETECTED
Barbiturates: NOT DETECTED
Benzodiazepines: NOT DETECTED
Cocaine: NOT DETECTED
Opiates: NOT DETECTED
Tetrahydrocannabinol: POSITIVE — AB

## 2011-07-07 LAB — SYPHILIS: RPR W/REFLEX TO RPR TITER AND TREPONEMAL ANTIBODIES, TRADITIONAL SCREENING AND DIAGNOSIS ALGORITHM: RPR Ser Ql: NONREACTIVE

## 2011-07-07 LAB — CBC
HCT: 28.2 — ABNORMAL LOW
MCHC: 34.1
MCV: 93.3
RBC: 3.02 — ABNORMAL LOW
RDW: 13.5
WBC: 6.8

## 2011-07-07 LAB — ABO/RH: ABO/RH(D): O POS

## 2011-10-11 ENCOUNTER — Emergency Department (HOSPITAL_COMMUNITY)
Admission: EM | Admit: 2011-10-11 | Discharge: 2011-10-11 | Disposition: A | Discharge status: Home / Self Care | Payer: Self-pay | Attending: Emergency Medicine | Admitting: Emergency Medicine

## 2011-10-11 ENCOUNTER — Encounter: Payer: Self-pay | Admitting: Emergency Medicine

## 2011-10-11 ENCOUNTER — Emergency Department (HOSPITAL_COMMUNITY): Payer: Self-pay

## 2011-10-11 DIAGNOSIS — W208XXA Other cause of strike by thrown, projected or falling object, initial encounter: Secondary | ICD-10-CM | POA: Insufficient documentation

## 2011-10-11 DIAGNOSIS — M79609 Pain in unspecified limb: Secondary | ICD-10-CM | POA: Insufficient documentation

## 2011-10-11 DIAGNOSIS — N390 Urinary tract infection, site not specified: Secondary | ICD-10-CM | POA: Insufficient documentation

## 2011-10-11 DIAGNOSIS — M79676 Pain in unspecified toe(s): Secondary | ICD-10-CM

## 2011-10-11 DIAGNOSIS — F17200 Nicotine dependence, unspecified, uncomplicated: Secondary | ICD-10-CM | POA: Insufficient documentation

## 2011-10-11 DIAGNOSIS — R300 Dysuria: Secondary | ICD-10-CM | POA: Insufficient documentation

## 2011-10-11 LAB — URINE MICROSCOPIC-ADD ON

## 2011-10-11 LAB — URINALYSIS, ROUTINE W REFLEX MICROSCOPIC
Bilirubin Urine: NEGATIVE
Glucose, UA: NEGATIVE mg/dL
Nitrite: NEGATIVE
Specific Gravity, Urine: 1.003 — ABNORMAL LOW (ref 1.005–1.030)
pH: 6 (ref 5.0–8.0)

## 2011-10-11 MED ORDER — IBUPROFEN 800 MG PO TABS: 800.0000 mg | ORAL_TABLET | Freq: Three times a day (TID) | ORAL | Status: AC | PRN

## 2011-10-11 MED ORDER — NITROFURANTOIN MONOHYD MACRO 100 MG PO CAPS: 100.0000 mg | ORAL_CAPSULE | Freq: Two times a day (BID) | ORAL | Status: AC

## 2011-10-11 MED ORDER — PHENAZOPYRIDINE HCL 200 MG PO TABS: 200.0000 mg | ORAL_TABLET | Freq: Three times a day (TID) | ORAL | Status: AC

## 2011-10-11 MED ORDER — HYDROCODONE-ACETAMINOPHEN 5-325 MG PO TABS
1.0000 | ORAL_TABLET | Freq: Once | ORAL | Status: AC
  Administered 2011-10-11: 1 via ORAL
  Filled 2011-10-11: qty 1

## 2011-10-11 NOTE — ED Provider Notes (Signed)
Medical screening examination/treatment/procedure(s) were performed by non-physician practitioner and as supervising physician I was immediately available for consultation/collaboration.  Doug Sou, MD 10/11/11 [phone]

## 2011-10-11 NOTE — ED Notes (Signed)
PT. REPORTS INJURY TO RIGHT 3RD / 4TH TOE THIS EVENING - "X-BOX CONSOLE DROPPED ON HER TOES , ALSO REPORTS DYSURIA/URINARY FREQUENCY FOR SEVERAL DAYS.

## 2011-10-11 NOTE — ED Provider Notes (Signed)
History     CSN: [id number]  Arrival date & time 10/11/11  4098   First MD Initiated Contact with Patient 10/11/11 [phone]      Chief Complaint  Patient presents with  . Toe Injury    (Consider location/radiation/quality/duration/timing/severity/associated sxs/prior treatment) HPI Comments: Patient with complaint of foot and toe pain after dropping an Xbox console on her R foot. Patient complains of pain and abrasion. She states she is unable to bear weight on her foot. Patient also complains of one week of pain with urination. She has been treating at home with pyridium without relief. Patient denies fever, nausea, vomiting, abdominal pain.  Patient is a 41 y.o. female presenting with toe pain and dysuria. The history is provided by the patient.  Toe Pain This is a new problem. Associated symptoms include arthralgias. Pertinent negatives include no abdominal pain, chest pain, chills, fever, joint swelling, myalgias, nausea, numbness, vomiting or weakness.  Dysuria  This is a new problem. The current episode started more than 2 days ago. The problem occurs every urination. The problem has not changed since onset.There has been no fever. Pertinent negatives include no chills, no nausea, no vomiting and no hematuria. Her past medical history is significant for recurrent UTIs.    History reviewed. No pertinent past medical history.  Past Surgical History  Procedure Date  . Breast lumpectomy   . Dilation and curettage of uterus     History reviewed. No pertinent family history.  History  Substance Use Topics  . Smoking status: Current Everyday Smoker  . Smokeless tobacco: Not on file  . Alcohol Use: Yes     OCCASIONAL    OB History    Grav Para Term Preterm Abortions TAB SAB Ect Mult Living                  Review of Systems  Constitutional: Negative for fever and chills.  Eyes: Negative for discharge.  Respiratory: Negative for shortness of breath.   Cardiovascular:  Negative for chest pain.  Gastrointestinal: Negative for nausea, vomiting, abdominal pain, diarrhea and constipation.  Genitourinary: Positive for dysuria. Negative for hematuria, vaginal bleeding, vaginal discharge and pelvic pain.  Musculoskeletal: Positive for arthralgias. Negative for myalgias and joint swelling.  Skin: Positive for wound. Negative for color change.  Neurological: Negative for weakness and numbness.  Psychiatric/Behavioral: Negative for confusion.    Allergies  Review of patient's allergies indicates no known allergies.  Home Medications   Current Outpatient Rx  Name Route Sig Dispense Refill  . CLONAZEPAM 1 MG PO TABS Oral Take 1 mg by mouth 2 (two) times daily as needed. anxiety     . NAPROXEN 500 MG PO TABS Oral Take 500 mg by mouth 3 (three) times daily with meals. Pain and inflammation     . ALKA-SELTZER PLS ALLERGY & CGH PO Oral Take 2 tablets by mouth every 6 (six) hours. Cold and congestion     . NYQUIL PO Oral Take 2 tablets by mouth at bedtime. Nasal congestion     . TRAMADOL HCL 50 MG PO TABS Oral Take 50 mg by mouth every 8 (eight) hours as needed. Pain     . CARISOPRODOL 350 MG PO TABS Oral Take 350 mg by mouth 3 (three) times daily as needed. Muscle spasm       . HYDROCODONE-ACETAMINOPHEN 10-500 MG PO TABS Oral Take 1 tablet by mouth every 6 (six) hours as needed. pain     . IBUPROFEN 800 MG  PO TABS Oral Take 800 mg by mouth every 8 (eight) hours as needed. Pain      . IBUPROFEN 800 MG PO TABS Oral Take 1 tablet (800 mg total) by mouth every 8 (eight) hours as needed for pain. 15 tablet 0  . NITROFURANTOIN MONOHYD MACRO 100 MG PO CAPS Oral Take 1 capsule (100 mg total) by mouth 2 (two) times daily. 10 capsule 0  . OXYCODONE-ACETAMINOPHEN 10-650 MG PO TABS Oral Take 1 tablet by mouth every 6 (six) hours as needed. pain     . PHENAZOPYRIDINE HCL 200 MG PO TABS Oral Take 1 tablet (200 mg total) by mouth 3 (three) times daily. 6 tablet 0    BP 95/57   Pulse 60  Temp(Src) 98.1 F (36.7 C) (Oral)  Resp 18  SpO2 98%  LMP 10/01/2011  Physical Exam  Nursing note and vitals reviewed. Constitutional: She is oriented to person, place, and time. She appears well-developed and well-nourished.  HENT:  Head: Normocephalic and atraumatic.  Eyes: Conjunctivae are normal.  Neck: Normal range of motion. Neck supple.  Abdominal: Soft. Bowel sounds are normal. There is no tenderness. There is no rebound, no guarding and no CVA tenderness.  Musculoskeletal: She exhibits tenderness. She exhibits no edema.       Patient with mild bruising over her right forefoot. Patient is diffusely tender in all areas of her forefoot. There is no obvious deformity. Patient has a small superficial laceration to the plantar aspect of her fourth toe. Wound is clean and well approximated. No tendon involvement suspected.  Neurological: She is alert and oriented to person, place, and time.       Distal motor, sensation, and vascular intact.   Skin: Skin is warm and dry. No erythema.  Psychiatric: She has a normal mood and affect. Her behavior is normal.    ED Course  Procedures (including critical care time)  Labs Reviewed  URINALYSIS, ROUTINE W REFLEX MICROSCOPIC - Abnormal; Notable for the following:    APPearance CLOUDY (*)    Specific Gravity, Urine 1.003 (*)    Hgb urine dipstick MODERATE (*)    Leukocytes, UA LARGE (*)    All other components within normal limits  POCT PREGNANCY, URINE  URINE MICROSCOPIC-ADD ON  POCT PREGNANCY, URINE  URINE CULTURE   Dg Foot Complete Right  10/11/2011  *RADIOLOGY REPORT*  Clinical Data: Pain and bruising of the second and third toes after dropping an object.  RIGHT FOOT COMPLETE - 3+ VIEW  Comparison: None.  Findings: Degenerative changes in the first metatarsal phalangeal joint.  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  No evidence of acute fracture or subluxation.  The second and third toes  specifically appear intact.  No focal bone lesion or bone destruction.  Bone cortex and trabecular architecture appear intact.  No radiopaque foreign bodies in the soft tissues.  IMPRESSION: No acute bony abnormalities identified.  Original Report Authenticated By: Marlon Pel, M.D.     1. Toe pain   2. Urinary tract infection     3:58 AM patient seen and examined. Patient informed of UA and x-ray results. Hard sole sandal and crutches given by orthopedic tech. Will treat urinary tract infection. Pain medicine given in emergency department. Patient urged to followup with orthopedic referral if no improvement in one week. She verbalizes understanding and agrees with plan.  MDM  Patient with foot injury, bruising. X-ray is negative. Patient critical signs and symptoms and UA consistent  with urinary tract infection. Antibiotics prescribed. No concern for pyelonephritis. Patient is well-appearing and is stable for discharge to home. Vital signs are normal.        Carolee Rota, PA 10/11/11 0400

## 2011-10-11 NOTE — ED Notes (Signed)
Collected urine while in Triage.

## 2011-10-12 LAB — URINE CULTURE

## 2011-11-06 ENCOUNTER — Encounter (HOSPITAL_COMMUNITY): Payer: Self-pay | Admitting: *Deleted

## 2011-11-06 ENCOUNTER — Emergency Department (HOSPITAL_COMMUNITY)
Admission: EM | Admit: 2011-11-06 | Discharge: 2011-11-06 | Disposition: A | Discharge status: Home / Self Care | Payer: Self-pay | Attending: Emergency Medicine | Admitting: Emergency Medicine

## 2011-11-06 DIAGNOSIS — H6091 Unspecified otitis externa, right ear: Secondary | ICD-10-CM

## 2011-11-06 DIAGNOSIS — G8929 Other chronic pain: Secondary | ICD-10-CM | POA: Insufficient documentation

## 2011-11-06 DIAGNOSIS — H60399 Other infective otitis externa, unspecified ear: Secondary | ICD-10-CM | POA: Insufficient documentation

## 2011-11-06 DIAGNOSIS — H9209 Otalgia, unspecified ear: Secondary | ICD-10-CM | POA: Insufficient documentation

## 2011-11-06 DIAGNOSIS — M542 Cervicalgia: Secondary | ICD-10-CM | POA: Insufficient documentation

## 2011-11-06 DIAGNOSIS — R070 Pain in throat: Secondary | ICD-10-CM | POA: Insufficient documentation

## 2011-11-06 MED ORDER — NEOMYCIN-POLYMYXIN-HC 3.5-10000-1 OT SUSP
3.0000 [drp] | Freq: Four times a day (QID) | OTIC | Status: DC
  Administered 2011-11-06: 3 [drp] via OTIC
  Filled 2011-11-06: qty 10

## 2011-11-06 MED ORDER — NEOMYCIN-COLIST-HC-THONZONIUM 3.3-3-10-0.5 MG/ML OT SUSP
3.0000 [drp] | Freq: Four times a day (QID) | OTIC | Status: DC
  Filled 2011-11-06: qty 5

## 2011-11-06 MED ORDER — IBUPROFEN 200 MG PO TABS
400.0000 mg | ORAL_TABLET | Freq: Once | ORAL | Status: AC
  Administered 2011-11-06: 400 mg via ORAL
  Filled 2011-11-06: qty 2

## 2011-11-06 NOTE — ED Notes (Signed)
Rt ear ache since this am.  

## 2011-11-06 NOTE — ED Provider Notes (Signed)
History     CSN: [id number]  Arrival date & time 11/06/11  1559   First MD Initiated Contact with Patient 11/06/11 1657      Chief Complaint  Patient presents with  . Otalgia    (Consider location/radiation/quality/duration/timing/severity/associated sxs/prior treatment) Patient is a 41 y.o. female presenting with ear pain.  Otalgia Associated symptoms include sore throat and neck pain.   Complains of pain in right ear for the past 2 days pain radiates to right throat no other complaints nothing makes symptoms better or worse no fever no other complaint no other associated symptoms pain is sharp moderate to severe. No other associated symptoms History reviewed. No pertinent past medical history. Bulging discs in neck and back, chronic pain goes to pain clinic Past Surgical History  Procedure Date  . Breast lumpectomy   . Dilation and curettage of uterus     History reviewed. No pertinent family history.  History  Substance Use Topics  . Smoking status: Current Everyday Smoker  . Smokeless tobacco: Not on file  . Alcohol Use: Yes     OCCASIONAL    OB History    Grav Para Term Preterm Abortions TAB SAB Ect Mult Living                  Review of Systems  Constitutional: Negative.   HENT: Positive for ear pain, sore throat and neck pain.        Chronic neck pain  Respiratory: Negative.   Cardiovascular: Negative.   Gastrointestinal: Negative.   Skin: Negative.   Neurological: Negative.   Hematological: Negative.   Psychiatric/Behavioral: Negative.   All other systems reviewed and are negative.    Allergies  Review of patient's allergies indicates no known allergies.  Home Medications   Current Outpatient Rx  Name Route Sig Dispense Refill  . CARISOPRODOL 350 MG PO TABS Oral Take 350 mg by mouth 3 (three) times daily as needed. Muscle spasm       . CLONAZEPAM 1 MG PO TABS Oral Take 1 mg by mouth 2 (two) times daily as needed. anxiety    .  HYDROCODONE-ACETAMINOPHEN 10-500 MG PO TABS Oral Take 1 tablet by mouth every 6 (six) hours as needed. pain     . IBUPROFEN 200 MG PO TABS Oral Take 400 mg by mouth every 6 (six) hours as needed. For pain    . IBUPROFEN 800 MG PO TABS Oral Take 800 mg by mouth every 8 (eight) hours as needed. Pain      . NAPROXEN 500 MG PO TABS Oral Take 500 mg by mouth 3 (three) times daily with meals. Pain and inflammation     . OXYCODONE-ACETAMINOPHEN 10-650 MG PO TABS Oral Take 1 tablet by mouth every 6 (six) hours as needed. pain       BP 117/74  Pulse 64  Temp(Src) 98.1 F (36.7 C) (Oral)  Resp 13  SpO2 99%  LMP 10/01/2011  Physical Exam  Nursing note and vitals reviewed. Constitutional: She appears well-developed and well-nourished.  HENT:  Head: Normocephalic and atraumatic.  Right Ear: External ear normal.  Left Ear: External ear normal.       Oropharynx minimally reddened no tonsillar exudate uvula is midline. Right ear canal is patent pain on retraction of the pinna or tragus, canal minimally reddened. No exudate in canal left ear is normal  Eyes: Conjunctivae are normal. Pupils are equal, round, and reactive to light.  Neck: Neck supple. No tracheal deviation present.  No thyromegaly present.  Cardiovascular: Normal rate and regular rhythm.   No murmur heard. Pulmonary/Chest: Effort normal and breath sounds normal.  Abdominal: Soft. Bowel sounds are normal. She exhibits no distension. There is no tenderness.  Musculoskeletal: Normal range of motion. She exhibits no edema and no tenderness.  Lymphadenopathy:    She has no cervical adenopathy.  Neurological: She is alert. Coordination normal.  Skin: Skin is warm and dry. No rash noted.  Psychiatric: She has a normal mood and affect.    ED Course  Procedures (including critical care time)  Labs Reviewed - No data to display No results found.   No diagnosis found.    MDM  Plan Cortisporin otic suspension 3 drops in right ear 4  times daily Tylenol or ibuprofen for pain Avoid getting water in ear ENT referral when necessary one week with Dr. Pollyann Kennedy Diagnosis otitis externa right ear        Doug Sou, MD 11/06/11 4098

## 2011-11-06 NOTE — ED Notes (Signed)
Patient stated onset today in am right ear pain worsening overtime currently 8/10 achy radiating to right neck. Stats hearing slightly muffled right ear.  Airway intact bilateral equal chest rise and fall.  Moves all extremities bilateral equal and strong.

## 2013-03-09 ENCOUNTER — Encounter (HOSPITAL_COMMUNITY): Payer: Self-pay | Admitting: Emergency Medicine

## 2013-03-09 ENCOUNTER — Emergency Department (HOSPITAL_COMMUNITY)
Admission: EM | Admit: 2013-03-09 | Discharge: 2013-03-10 | Disposition: A | Discharge status: Home / Self Care | Payer: Medicaid Other | Attending: Emergency Medicine | Admitting: Emergency Medicine

## 2013-03-09 DIAGNOSIS — M545 Low back pain, unspecified: Secondary | ICD-10-CM | POA: Insufficient documentation

## 2013-03-09 DIAGNOSIS — E860 Dehydration: Secondary | ICD-10-CM

## 2013-03-09 DIAGNOSIS — G8929 Other chronic pain: Secondary | ICD-10-CM | POA: Insufficient documentation

## 2013-03-09 DIAGNOSIS — M549 Dorsalgia, unspecified: Secondary | ICD-10-CM

## 2013-03-09 DIAGNOSIS — Z79899 Other long term (current) drug therapy: Secondary | ICD-10-CM | POA: Insufficient documentation

## 2013-03-09 DIAGNOSIS — F17200 Nicotine dependence, unspecified, uncomplicated: Secondary | ICD-10-CM | POA: Insufficient documentation

## 2013-03-09 HISTORY — DX: Dorsalgia, unspecified: M54.9

## 2013-03-09 HISTORY — DX: Other chronic pain: G89.29
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
## 2013-03-10 MED ORDER — OXYCODONE-ACETAMINOPHEN 10-325 MG PO TABS: 1.0000 | ORAL_TABLET | Freq: Three times a day (TID) | ORAL | Status: DC | PRN

## 2013-03-10 MED ORDER — DICLOFENAC SODIUM 50 MG PO TBEC: 50.0000 mg | DELAYED_RELEASE_TABLET | Freq: Two times a day (BID) | ORAL | Status: DC

## 2013-03-10 MED ORDER — SODIUM CHLORIDE 0.9 % IV BOLUS (SEPSIS)
1000.0000 mL | Freq: Once | INTRAVENOUS | Status: AC
  Administered 2013-03-10: 1000 mL via INTRAVENOUS

## 2013-03-10 MED ORDER — MORPHINE SULFATE 4 MG/ML IJ SOLN: 4.0000 mg | Freq: Once | INTRAMUSCULAR | Status: DC

## 2013-03-10 MED ORDER — FENTANYL CITRATE 0.05 MG/ML IJ SOLN
100.0000 ug | Freq: Once | INTRAMUSCULAR | Status: AC
  Administered 2013-03-10: 100 ug via INTRAVENOUS
  Filled 2013-03-10: qty 2

## 2013-03-10 MED ORDER — ONDANSETRON 4 MG PO TBDP
4.0000 mg | ORAL_TABLET | Freq: Once | ORAL | Status: AC
Start: 2013-03-10 — End: 2013-03-10
  Administered 2013-03-10: 4 mg via ORAL
  Filled 2013-03-10: qty 1

## 2013-03-10 NOTE — ED Notes (Signed)
Pt ready for discharge. Pt states she called husband to come and pick her up.
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
## 2013-03-10 NOTE — ED Notes (Signed)
T. Neva Seat, PA updated re: VS and ordered morphine not given. Eme, RN updated also.

## 2013-03-10 NOTE — ED Provider Notes (Signed)
Medical screening examination/treatment/procedure(s) were performed by non-physician practitioner and as supervising physician I was immediately available for consultation/collaboration.  John-Adam Carr Shartzer, M.D.     John-Adam Nerea Bordenave, MD 03/10/13 0357 

## 2013-03-10 NOTE — ED Notes (Signed)
T. Neva Seat, PA updated re: VS and pain scale after bolus and meds.

## 2013-04-05 ENCOUNTER — Encounter (HOSPITAL_COMMUNITY): Payer: Self-pay | Admitting: Emergency Medicine

## 2013-04-05 ENCOUNTER — Emergency Department (HOSPITAL_COMMUNITY)
Admission: EM | Admit: 2013-04-05 | Discharge: 2013-04-05 | Disposition: A | Discharge status: Home / Self Care | Payer: Medicaid Other | Attending: Emergency Medicine | Admitting: Emergency Medicine

## 2013-04-05 DIAGNOSIS — M549 Dorsalgia, unspecified: Secondary | ICD-10-CM

## 2013-04-05 DIAGNOSIS — M542 Cervicalgia: Secondary | ICD-10-CM | POA: Insufficient documentation

## 2013-04-05 DIAGNOSIS — G8929 Other chronic pain: Secondary | ICD-10-CM

## 2013-04-05 DIAGNOSIS — M545 Low back pain, unspecified: Secondary | ICD-10-CM | POA: Insufficient documentation

## 2013-04-05 DIAGNOSIS — F17200 Nicotine dependence, unspecified, uncomplicated: Secondary | ICD-10-CM | POA: Insufficient documentation

## 2013-04-05 MED ORDER — OXYCODONE-ACETAMINOPHEN 5-325 MG PO TABS
1.0000 | ORAL_TABLET | Freq: Once | ORAL | Status: AC
  Administered 2013-04-05: 1 via ORAL
  Filled 2013-04-05: qty 1

## 2013-04-05 MED ORDER — OXYCODONE-ACETAMINOPHEN 5-325 MG PO TABS: 1.0000 | ORAL_TABLET | Freq: Four times a day (QID) | ORAL | Status: DC | PRN

## 2013-04-05 MED ORDER — METHOCARBAMOL 500 MG PO TABS: 1000.0000 mg | ORAL_TABLET | Freq: Four times a day (QID) | ORAL | Status: DC

## 2013-04-05 NOTE — ED Provider Notes (Signed)
Medical screening examination/treatment/procedure(s) were performed by non-physician practitioner and as supervising physician I was immediately available for consultation/collaboration. Eriel Doyon, MD, FACEP   Alizia Greif L Nazyia Gaugh, MD 04/05/13 1528 

## 2013-04-05 NOTE — ED Provider Notes (Signed)
History    CSN: [id number] Arrival date & time 04/05/13  1354  First MD Initiated Contact with Patient 04/05/13 1428     Chief Complaint  Patient presents with  . Neck Pain   (Consider location/radiation/quality/duration/timing/severity/associated sxs/prior Treatment) HPI Comments: Patient with history of chronic neck and lower back pain presents with worsening neck pain and stiffness for the past several days. She also has lower back pain with radiation into her left leg. No weakness in arms or legs. No red flag signs and symptoms of lower back pain. Patient has been using ibuprofen without relief. Pain is consistent with her previous flares of chronic pain. Patient states that she used to be in a pain clinic but her doctor quit several months ago and she has not seen anyone consistently since. She admits to one emergency department visit. This history is consistent with what is found in the [place] substance reporting database. Onset of symptoms gradual. Course is constant. Nothing makes symptoms better.  The history is provided by the patient and medical records.   Past Medical History  Diagnosis Date  . Chronic back pain    Past Surgical History  Procedure Laterality Date  . Breast lumpectomy    . Dilation and curettage of uterus     History reviewed. No pertinent family history. History  Substance Use Topics  . Smoking status: Current Every Day Smoker  . Smokeless tobacco: Not on file  . Alcohol Use: Yes     Comment: OCCASIONAL   OB History   Grav Para Term Preterm Abortions TAB SAB Ect Mult Living                 Review of Systems  Constitutional: Negative for fever and unexpected weight change.  HENT: Positive for neck pain.   Gastrointestinal: Negative for constipation.       Negative for fecal incontinence.   Genitourinary: Negative for dysuria, hematuria, flank pain, vaginal bleeding, vaginal discharge and pelvic pain.       Negative for urinary  incontinence or retention.  Musculoskeletal: Positive for back pain.  Neurological: Negative for weakness and numbness.       Denies saddle paresthesias.    Allergies  Review of patient's allergies indicates no known allergies.  Home Medications   Current Outpatient Rx  Name  Route  Sig  Dispense  Refill  . clonazePAM (KLONOPIN) 1 MG tablet   Oral   Take 1 mg by mouth 2 (two) times daily as needed for anxiety.         . methocarbamol (ROBAXIN) 500 MG tablet   Oral   Take 2 tablets (1,000 mg total) by mouth 4 (four) times daily.   20 tablet   0   . oxyCODONE-acetaminophen (PERCOCET/ROXICET) 5-325 MG per tablet   Oral   Take 1-2 tablets by mouth every 6 (six) hours as needed for pain.   12 tablet   0    BP 96/59  Pulse 77  Temp(Src) 97.7 F (36.5 C) (Oral)  SpO2 96%  LMP 03/09/2013 Physical Exam  Nursing note and vitals reviewed. Constitutional: She appears well-developed and well-nourished.  HENT:  Head: Normocephalic and atraumatic.  Eyes: Conjunctivae are normal.  Neck: Normal range of motion. Neck supple.  Pulmonary/Chest: Effort normal.  Abdominal: Soft. There is no tenderness. There is no CVA tenderness.  Musculoskeletal: Normal range of motion.  No step-off noted with palpation of spine.   Neurological: She is alert. She has normal strength and  normal reflexes. No sensory deficit.  5/5 strength in entire lower extremities bilaterally. No sensation deficit.   Skin: Skin is warm and dry. No rash noted.  Psychiatric: She has a normal mood and affect.    ED Course  Procedures (including critical care time) Labs Reviewed - No data to display No results found. 1. Chronic neck pain   2. Chronic back pain    2:40 PM Patient seen and examined. Work-up initiated. Medications ordered.   Vital signs reviewed and are as follows: Filed Vitals:   04/05/13 1414  BP: 96/59  Pulse: 77  Temp: 97.7 F (36.5 C)    No red flag s/s of low back pain. Patient was  counseled on back pain precautions and told to do activity as tolerated but do not lift, push, or pull heavy objects more than 10 pounds for the next week.  Patient counseled to use ice or heat on back for no longer than 15 minutes every hour.   Patient prescribed muscle relaxer and counseled on proper use of muscle relaxant medication.    Patient prescribed narcotic pain medicine and counseled on proper use of narcotic pain medications. Counseled not to combine this medication with others containing tylenol.   Urged patient not to drink alcohol, drive, or perform any other activities that requires focus while taking either of these medications.  Patient urged to follow-up with PCP if pain does not improve with treatment and rest or if pain becomes recurrent. Urged to return with worsening severe pain, loss of bowel or bladder control, trouble walking.   The patient verbalizes understanding and agrees with the plan.   MDM  Patient with chronic back and neck pain. No neurological deficits. Patient is ambulatory. No warning symptoms of back pain including: loss of bowel or bladder control, night sweats, waking from sleep with back pain, unexplained fevers or weight loss, h/o cancer, IVDU, recent trauma. No concern for cauda equina, epidural abscess, or other serious cause of back pain. Conservative measures such as rest, ice/heat and pain medicine indicated with PCP follow-up if no improvement with conservative management.     Renne Crigler, PA-C 04/05/13 1519

## 2013-04-05 NOTE — ED Notes (Signed)
Pt c/o chronic neck pain. Has hx of bulging discs, states worse over past four days. Pt was seen for same 3 weeks ago.

## 2013-06-11 ENCOUNTER — Emergency Department (HOSPITAL_COMMUNITY)
Admission: EM | Admit: 2013-06-11 | Discharge: 2013-06-11 | Disposition: A | Discharge status: Home / Self Care | Payer: Medicaid Other | Attending: Emergency Medicine | Admitting: Emergency Medicine

## 2013-06-11 ENCOUNTER — Encounter (HOSPITAL_COMMUNITY): Payer: Self-pay | Admitting: *Deleted

## 2013-06-11 DIAGNOSIS — M62838 Other muscle spasm: Secondary | ICD-10-CM | POA: Insufficient documentation

## 2013-06-11 DIAGNOSIS — M542 Cervicalgia: Secondary | ICD-10-CM | POA: Insufficient documentation

## 2013-06-11 DIAGNOSIS — M545 Low back pain, unspecified: Secondary | ICD-10-CM | POA: Insufficient documentation

## 2013-06-11 DIAGNOSIS — F17200 Nicotine dependence, unspecified, uncomplicated: Secondary | ICD-10-CM | POA: Insufficient documentation

## 2013-06-11 DIAGNOSIS — Z87828 Personal history of other (healed) physical injury and trauma: Secondary | ICD-10-CM | POA: Insufficient documentation

## 2013-06-11 DIAGNOSIS — G8929 Other chronic pain: Secondary | ICD-10-CM | POA: Insufficient documentation

## 2013-06-11 DIAGNOSIS — M62830 Muscle spasm of back: Secondary | ICD-10-CM

## 2013-06-11 MED ORDER — METHOCARBAMOL 500 MG PO TABS: 500.0000 mg | ORAL_TABLET | Freq: Two times a day (BID) | ORAL | Status: DC

## 2013-06-11 MED ORDER — IBUPROFEN 800 MG PO TABS: 800.0000 mg | ORAL_TABLET | Freq: Three times a day (TID) | ORAL | Status: DC | PRN

## 2013-06-11 MED ORDER — ACETAMINOPHEN-CODEINE #3 300-30 MG PO TABS: 1.0000 | ORAL_TABLET | Freq: Four times a day (QID) | ORAL | Status: DC | PRN

## 2013-06-11 NOTE — ED Provider Notes (Signed)
Medical screening examination/treatment/procedure(s) were performed by non-physician practitioner and as supervising physician I was immediately available for consultation/collaboration.   Candyce Churn, MD 06/11/13 [phone]

## 2013-06-11 NOTE — ED Notes (Signed)
Pt has slurred speech. "I can't sit here much longer".
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
## 2013-07-15 ENCOUNTER — Emergency Department (HOSPITAL_COMMUNITY)
Admission: EM | Admit: 2013-07-15 | Discharge: 2013-07-15 | Disposition: A | Discharge status: Home / Self Care | Payer: Medicaid Other | Source: Home / Self Care | Attending: Emergency Medicine | Admitting: Emergency Medicine

## 2013-07-15 ENCOUNTER — Encounter (HOSPITAL_COMMUNITY): Payer: Self-pay | Admitting: Emergency Medicine

## 2013-07-15 DIAGNOSIS — G8929 Other chronic pain: Secondary | ICD-10-CM

## 2013-07-15 DIAGNOSIS — M549 Dorsalgia, unspecified: Secondary | ICD-10-CM

## 2013-07-15 MED ORDER — DICLOFENAC POTASSIUM 50 MG PO TABS: 50.0000 mg | ORAL_TABLET | Freq: Three times a day (TID) | ORAL | Status: DC

## 2013-07-15 MED ORDER — ACETAMINOPHEN-CODEINE #3 300-30 MG PO TABS: 1.0000 | ORAL_TABLET | ORAL | Status: DC | PRN

## 2013-07-15 MED ORDER — CARISOPRODOL 350 MG PO TABS: 350.0000 mg | ORAL_TABLET | Freq: Three times a day (TID) | ORAL | Status: DC
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
## 2013-07-15 NOTE — ED Provider Notes (Signed)
Medical screening examination/treatment/procedure(s) were performed by non-physician practitioner and as supervising physician I was immediately available for consultation/collaboration.  Leslee Home, M.D.  Reuben Likes, MD 07/15/13 2035

## 2013-08-07 ENCOUNTER — Other Ambulatory Visit: Payer: Self-pay | Admitting: Physician Assistant

## 2013-08-07 DIAGNOSIS — N6313 Unspecified lump in the right breast, lower outer quadrant: Secondary | ICD-10-CM

## 2013-08-22 ENCOUNTER — Ambulatory Visit
Admission: RE | Admit: 2013-08-22 | Discharge: 2013-08-22 | Disposition: A | Discharge status: Home / Self Care | Payer: Medicaid Other | Source: Ambulatory Visit | Attending: Physician Assistant | Admitting: Physician Assistant

## 2013-08-22 DIAGNOSIS — N6313 Unspecified lump in the right breast, lower outer quadrant: Secondary | ICD-10-CM

## 2013-10-27 ENCOUNTER — Emergency Department (HOSPITAL_COMMUNITY): Payer: No Typology Code available for payment source

## 2013-10-27 ENCOUNTER — Emergency Department (HOSPITAL_COMMUNITY)
Admission: EM | Admit: 2013-10-27 | Discharge: 2013-10-27 | Disposition: A | Discharge status: Home / Self Care | Payer: No Typology Code available for payment source | Attending: Emergency Medicine | Admitting: Emergency Medicine

## 2013-10-27 ENCOUNTER — Encounter (HOSPITAL_COMMUNITY): Payer: Self-pay | Admitting: Emergency Medicine

## 2013-10-27 DIAGNOSIS — S335XXA Sprain of ligaments of lumbar spine, initial encounter: Secondary | ICD-10-CM | POA: Insufficient documentation

## 2013-10-27 DIAGNOSIS — Z3202 Encounter for pregnancy test, result negative: Secondary | ICD-10-CM | POA: Insufficient documentation

## 2013-10-27 DIAGNOSIS — S40019A Contusion of unspecified shoulder, initial encounter: Secondary | ICD-10-CM | POA: Insufficient documentation

## 2013-10-27 DIAGNOSIS — M542 Cervicalgia: Secondary | ICD-10-CM | POA: Insufficient documentation

## 2013-10-27 DIAGNOSIS — Z79899 Other long term (current) drug therapy: Secondary | ICD-10-CM | POA: Insufficient documentation

## 2013-10-27 DIAGNOSIS — Y939 Activity, unspecified: Secondary | ICD-10-CM | POA: Insufficient documentation

## 2013-10-27 DIAGNOSIS — Y92410 Unspecified street and highway as the place of occurrence of the external cause: Secondary | ICD-10-CM | POA: Insufficient documentation

## 2013-10-27 DIAGNOSIS — R269 Unspecified abnormalities of gait and mobility: Secondary | ICD-10-CM | POA: Insufficient documentation

## 2013-10-27 DIAGNOSIS — S239XXA Sprain of unspecified parts of thorax, initial encounter: Secondary | ICD-10-CM | POA: Insufficient documentation

## 2013-10-27 DIAGNOSIS — F17200 Nicotine dependence, unspecified, uncomplicated: Secondary | ICD-10-CM | POA: Insufficient documentation

## 2013-10-27 DIAGNOSIS — G8929 Other chronic pain: Secondary | ICD-10-CM | POA: Insufficient documentation

## 2013-10-27 LAB — POCT PREGNANCY, URINE: Preg Test, Ur: NEGATIVE

## 2013-10-27 MED ORDER — KETOROLAC TROMETHAMINE 15 MG/ML IJ SOLN
15.0000 mg | Freq: Once | INTRAMUSCULAR | Status: AC
  Administered 2013-10-27: 15 mg via INTRAVENOUS
  Filled 2013-10-27: qty 1

## 2013-10-27 MED ORDER — HYDROMORPHONE HCL PF 1 MG/ML IJ SOLN
1.0000 mg | Freq: Once | INTRAMUSCULAR | Status: AC
  Administered 2013-10-27: 1 mg via INTRAVENOUS
  Filled 2013-10-27: qty 1

## 2013-10-27 MED ORDER — ONDANSETRON HCL 4 MG/2ML IJ SOLN
4.0000 mg | Freq: Once | INTRAMUSCULAR | Status: AC
  Administered 2013-10-27: 4 mg via INTRAVENOUS
  Filled 2013-10-27: qty 2

## 2013-10-27 NOTE — ED Notes (Signed)
Dr. Judie PetitM. Allen at bedside.
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
## 2013-10-27 NOTE — ED Notes (Signed)
Pt up to BR with assist x 1.  Appears to be ambulating without distress.
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
## 2013-10-27 NOTE — ED Notes (Signed)
Patient transported to X-ray 

## 2013-10-27 NOTE — ED Notes (Signed)
Dr. M. Allen at bedside. 
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
## 2013-10-27 NOTE — ED Notes (Addendum)
Pt returned from XR, pt requesting something to drink, pt placed back on BP cuff and continuous pulse ox

## 2014-03-03 ENCOUNTER — Encounter (HOSPITAL_COMMUNITY): Payer: Self-pay | Admitting: Emergency Medicine

## 2014-03-03 ENCOUNTER — Emergency Department (HOSPITAL_COMMUNITY)
Admission: EM | Admit: 2014-03-03 | Discharge: 2014-03-03 | Disposition: A | Discharge status: Home / Self Care | Payer: Medicaid Other | Attending: Emergency Medicine | Admitting: Emergency Medicine

## 2014-03-03 DIAGNOSIS — Z79899 Other long term (current) drug therapy: Secondary | ICD-10-CM | POA: Insufficient documentation

## 2014-03-03 DIAGNOSIS — R209 Unspecified disturbances of skin sensation: Secondary | ICD-10-CM | POA: Insufficient documentation

## 2014-03-03 DIAGNOSIS — G8929 Other chronic pain: Secondary | ICD-10-CM | POA: Insufficient documentation

## 2014-03-03 DIAGNOSIS — M549 Dorsalgia, unspecified: Secondary | ICD-10-CM

## 2014-03-03 DIAGNOSIS — M545 Low back pain, unspecified: Secondary | ICD-10-CM | POA: Insufficient documentation

## 2014-03-03 DIAGNOSIS — F17200 Nicotine dependence, unspecified, uncomplicated: Secondary | ICD-10-CM | POA: Insufficient documentation

## 2014-03-03 MED ORDER — OXYCODONE-ACETAMINOPHEN 5-325 MG PO TABS
1.0000 | ORAL_TABLET | Freq: Once | ORAL | Status: AC
  Administered 2014-03-03: 1 via ORAL
  Filled 2014-03-03: qty 1

## 2014-03-03 MED ORDER — FENTANYL CITRATE 0.05 MG/ML IJ SOLN
INTRAMUSCULAR | Status: DC
Start: 2014-03-03 — End: 2014-03-04
  Filled 2014-03-03: qty 2

## 2014-03-03 MED ORDER — KETOROLAC TROMETHAMINE 60 MG/2ML IM SOLN
60.0000 mg | Freq: Once | INTRAMUSCULAR | Status: AC
  Administered 2014-03-03: 60 mg via INTRAMUSCULAR
  Filled 2014-03-03: qty 2

## 2014-03-03 MED ORDER — NAPROXEN 500 MG PO TABS: 500.0000 mg | ORAL_TABLET | Freq: Two times a day (BID) | ORAL | Status: DC

## 2014-03-03 MED ORDER — HYDROCODONE-ACETAMINOPHEN 5-325 MG PO TABS: 2.0000 | ORAL_TABLET | ORAL | Status: DC | PRN

## 2014-03-03 MED ORDER — FENTANYL CITRATE 0.05 MG/ML IJ SOLN
50.0000 ug | Freq: Once | INTRAMUSCULAR | Status: AC
  Administered 2014-03-03: 50 ug via NASAL

## 2014-03-03 NOTE — ED Provider Notes (Signed)
CSN: [id number]     Arrival date & time 03/03/14  1836 History   First MD Initiated Contact with Patient 03/03/14 2152     Chief Complaint  Patient presents with  . Back Pain     Patient is a 43 year old female with past medical history as below relevant for chronic back pain who presents with acute on chronic back pain. Patient reports that yesterday she was walking longer distances than normal. Starting last night she began having lower back pain that will intermittently radiate into both toes. She denies any weakness, numbness, bowel/bladder incontinence, fevers, falls, or any trauma.     (Consider location/radiation/quality/duration/timing/severity/associated sxs/prior Treatment) Patient is a 43 y.o. female presenting with back pain. The history is provided by the patient and medical records. No language interpreter was used.  Back Pain Location:  Lumbar spine Quality:  Aching Radiates to:  L foot and R foot Pain severity:  Severe Pain is:  Same all the time Onset quality:  Gradual Duration:  24 hours Timing:  Constant Progression:  Worsening Chronicity:  Chronic Context: physical stress   Relieved by:  Nothing Worsened by:  Movement Ineffective treatments:  Lying down, ibuprofen, OTC medications, bed rest and being still Associated symptoms: tingling (intermittent and in toes)   Associated symptoms: no abdominal pain, no abdominal swelling, no bladder incontinence, no bowel incontinence, no chest pain, no dysuria, no fever, no headaches, no leg pain, no numbness, no paresthesias, no pelvic pain, no perianal numbness and no weakness   Risk factors: no hx of cancer, no hx of osteoporosis, not obese, not pregnant, no recent surgery and no steroid use     Past Medical History  Diagnosis Date  . Chronic back pain    Past Surgical History  Procedure Laterality Date  . Breast lumpectomy    . Dilation and curettage of uterus     No family history on file. History   Substance Use Topics  . Smoking status: Current Every Day Smoker  . Smokeless tobacco: Not on file  . Alcohol Use: Yes     Comment: OCCASIONAL   OB History   Grav Para Term Preterm Abortions TAB SAB Ect Mult Living                 Review of Systems  Constitutional: Negative for fever.  Cardiovascular: Negative for chest pain.  Gastrointestinal: Negative for abdominal pain and bowel incontinence.  Genitourinary: Negative for bladder incontinence, dysuria and pelvic pain.  Musculoskeletal: Positive for back pain.  Neurological: Positive for tingling (intermittent and in toes). Negative for weakness, numbness, headaches and paresthesias.  All other systems reviewed and are negative.     Allergies  Review of patient's allergies indicates no known allergies.  Home Medications   Prior to Admission medications   Medication Sig Start Date End Date Taking? Authorizing Provider  busPIRone (BUSPAR) 15 MG tablet Take 15 mg by mouth daily as needed (Anxiety).   Yes Historical Provider, MD  cloNIDine (CATAPRES) 0.1 MG tablet Take 0.1 mg by mouth 2 (two) times daily.   Yes Historical Provider, MD  FLUoxetine (PROZAC) 40 MG capsule Take 40 mg by mouth daily.   Yes Historical Provider, MD  lamoTRIgine (LAMICTAL) 25 MG tablet Take 25 mg by mouth 2 (two) times daily.   Yes Historical Provider, MD   BP 104/60  Pulse 75  Temp(Src) 98.4 F (36.9 C) (Oral)  Resp 20  Ht 5\' 8"  (1.727 m)  Wt 144 lb (65.318 kg)  BMI 21.90 kg/m2  SpO2 96% Physical Exam  Nursing note and vitals reviewed. Constitutional: She is oriented to person, place, and time. She appears well-developed and well-nourished.  HENT:  Head: Normocephalic and atraumatic.  Right Ear: External ear normal.  Left Ear: External ear normal.  Nose: Nose normal.  Mouth/Throat: Oropharynx is clear and moist.  Eyes: Conjunctivae and EOM are normal. Pupils are equal, round, and reactive to light.  Neck: Normal range of motion. Neck  supple.  Cardiovascular: Normal rate, regular rhythm, normal heart sounds and intact distal pulses.   Pulmonary/Chest: Effort normal and breath sounds normal.  Abdominal: Soft. Bowel sounds are normal.  Genitourinary: Rectum normal. Rectal exam shows anal tone normal.  Musculoskeletal: Normal range of motion. She exhibits tenderness (diffuse TTP over lumbar spine region). She exhibits no edema.  Neurological: She is alert and oriented to person, place, and time. She has normal strength and normal reflexes. No cranial nerve deficit or sensory deficit. Coordination and gait normal. GCS eye subscore is 4. GCS verbal subscore is 5. GCS motor subscore is 6.  Patient able to ambulate without assistance but she report pain in back.    Skin: Skin is warm and dry.  Psychiatric: She has a normal mood and affect.    ED Course  Procedures (including critical care time) Labs Review Labs Reviewed - No data to display  Imaging Review No results found.   EKG Interpretation None      MDM   Final diagnoses:  Back pain    Patient presents with complaints of back pain. Patient has history of chronic back pain and she reports that while walking longer distances yesterday she experienced lower back pain radiating to bilateral toes. Her vitals were unremarkable including no fever, no tachycardia, no hypertension. Exam as above and remarkable for no areas of abnormal sensation, normal neurological exam, patient able to ambulate without difficulty, and otherwise as above. Doubt acute pathology such as fracture, cauda equina, tumor, epidural abscess, or other emergent pathology. Most likely etiology of acute on chronic back pain/lumbar strain. Will treat with short course of narcotics, NSAIDs, and close followup.    Johnney Ouerek Aeson Sawyers, MD 03/04/14 [phone]

## 2014-03-03 NOTE — Discharge Instructions (Signed)
Back Pain, Adult Low back pain is very common. About 1 in 5 people have back pain.The cause of low back pain is rarely dangerous. The pain often gets better over time.About half of people with a sudden onset of back pain feel better in just 2 weeks. About 8 in 10 people feel better by 6 weeks.  CAUSES Some common causes of back pain include:  Strain of the muscles or ligaments supporting the spine.  Wear and tear (degeneration) of the spinal discs.  Arthritis.  Direct injury to the back. DIAGNOSIS Most of the time, the direct cause of low back pain is not known.However, back pain can be treated effectively even when the exact cause of the pain is unknown.Answering your caregiver's questions about your overall health and symptoms is one of the most accurate ways to make sure the cause of your pain is not dangerous. If your caregiver needs more information, he or she may order lab work or imaging tests (X-rays or MRIs).However, even if imaging tests show changes in your back, this usually does not require surgery. HOME CARE INSTRUCTIONS For many people, back pain returns.Since low back pain is rarely dangerous, it is often a condition that people can learn to manageon their own.   Remain active. It is stressful on the back to sit or stand in one place. Do not sit, drive, or stand in one place for more than 30 minutes at a time. Take short walks on level surfaces as soon as pain allows.Try to increase the length of time you walk each day.  Do not stay in bed.Resting more than 1 or 2 days can delay your recovery.  Do not avoid exercise or work.Your body is made to move.It is not dangerous to be active, even though your back may hurt.Your back will likely heal faster if you return to being active before your pain is gone.  Pay attention to your body when you bend and lift. Many people have less discomfortwhen lifting if they bend their knees, keep the load close to their bodies,and  avoid twisting. Often, the most comfortable positions are those that put less stress on your recovering back.  Find a comfortable position to sleep. Use a firm mattress and lie on your side with your knees slightly bent. If you lie on your back, put a pillow under your knees.  Only take over-the-counter or prescription medicines as directed by your caregiver. Over-the-counter medicines to reduce pain and inflammation are often the most helpful.Your caregiver may prescribe muscle relaxant drugs.These medicines help dull your pain so you can more quickly return to your normal activities and healthy exercise.  Put ice on the injured area.  Put ice in a plastic bag.  Place a towel between your skin and the bag.  Leave the ice on for 15-20 minutes, 03-04 times a day for the first 2 to 3 days. After that, ice and heat may be alternated to reduce pain and spasms.  Ask your caregiver about trying back exercises and gentle massage. This may be of some benefit.  Avoid feeling anxious or stressed.Stress increases muscle tension and can worsen back pain.It is important to recognize when you are anxious or stressed and learn ways to manage it.Exercise is a great option. SEEK MEDICAL CARE IF:  You have pain that is not relieved with rest or medicine.  You have pain that does not improve in 1 week.  You have new symptoms.  You are generally not feeling well. SEEK   IMMEDIATE MEDICAL CARE IF:   You have pain that radiates from your back into your legs.  You develop new bowel or bladder control problems.  You have unusual weakness or numbness in your arms or legs.  You develop nausea or vomiting.  You develop abdominal pain.  You feel faint. Document Released: 09/25/2005 Document Revised: 03/26/2012 Document Reviewed: 02/13/2011 [employer] Patient Information 2014 [employer].  

## 2014-03-03 NOTE — ED Notes (Signed)
Pt reports hx of 5 bulging disks; is tearful; reports she did a lot of walking yesterday. Points to her lower midline back and source of pain and reports when she empties her bladder, she feels sharp pain in back. Denies urinary symptoms.

## 2014-03-03 NOTE — ED Notes (Signed)
MD at bedside. 

## 2014-03-03 NOTE — ED Notes (Signed)
PT reports her BP is usually low and that number is her baseline.

## 2014-03-03 NOTE — ED Notes (Signed)
Pt. reports low back pain radiating to both legs onset this morning , pt. [place] , denies injury or fall.

## 2014-03-06 NOTE — ED Provider Notes (Signed)
I saw and evaluated the patient, reviewed the resident's note and I agree with the findings and plan.   EKG Interpretation None      Acute on chronic back pain, no red flags. Pain meds and discharge.  Audree Camel, MD 03/06/14 [phone]

## 2014-04-21 ENCOUNTER — Ambulatory Visit: Payer: Medicaid Other | Attending: Internal Medicine | Admitting: Internal Medicine

## 2014-04-21 ENCOUNTER — Encounter: Payer: Self-pay | Admitting: Internal Medicine

## 2014-04-21 VITALS — BP 111/67 | HR 70 | Temp 97.8°F | Resp 15 | Wt 153.4 lb

## 2014-04-21 DIAGNOSIS — M5137 Other intervertebral disc degeneration, lumbosacral region: Secondary | ICD-10-CM | POA: Diagnosis not present

## 2014-04-21 DIAGNOSIS — Z791 Long term (current) use of non-steroidal anti-inflammatories (NSAID): Secondary | ICD-10-CM | POA: Diagnosis not present

## 2014-04-21 DIAGNOSIS — M47812 Spondylosis without myelopathy or radiculopathy, cervical region: Secondary | ICD-10-CM | POA: Diagnosis not present

## 2014-04-21 DIAGNOSIS — Z8261 Family history of arthritis: Secondary | ICD-10-CM | POA: Insufficient documentation

## 2014-04-21 DIAGNOSIS — F319 Bipolar disorder, unspecified: Secondary | ICD-10-CM | POA: Insufficient documentation

## 2014-04-21 DIAGNOSIS — M545 Low back pain, unspecified: Secondary | ICD-10-CM | POA: Insufficient documentation

## 2014-04-21 DIAGNOSIS — M51379 Other intervertebral disc degeneration, lumbosacral region without mention of lumbar back pain or lower extremity pain: Secondary | ICD-10-CM | POA: Insufficient documentation

## 2014-04-21 DIAGNOSIS — G8929 Other chronic pain: Secondary | ICD-10-CM | POA: Diagnosis not present

## 2014-04-21 DIAGNOSIS — M2560 Stiffness of unspecified joint, not elsewhere classified: Secondary | ICD-10-CM | POA: Diagnosis not present

## 2014-04-21 DIAGNOSIS — M5020 Other cervical disc displacement, unspecified cervical region: Secondary | ICD-10-CM | POA: Insufficient documentation

## 2014-04-21 DIAGNOSIS — Z139 Encounter for screening, unspecified: Secondary | ICD-10-CM | POA: Insufficient documentation

## 2014-04-21 LAB — CBC WITH DIFFERENTIAL/PLATELET
BASOS ABS: 0.1 10*3/uL (ref 0.0–0.1)
Basophils Relative: 1 % (ref 0–1)
Eosinophils Absolute: 0.2 10*3/uL (ref 0.0–0.7)
Eosinophils Relative: 4 % (ref 0–5)
HCT: 39.9 % (ref 36.0–46.0)
HEMOGLOBIN: 13.2 g/dL (ref 12.0–15.0)
LYMPHS PCT: 29 % (ref 12–46)
Lymphs Abs: 1.7 10*3/uL (ref 0.7–4.0)
MCH: 29.2 pg (ref 26.0–34.0)
MCHC: 33.1 g/dL (ref 30.0–36.0)
MCV: 88.3 fL (ref 78.0–100.0)
MONO ABS: 0.5 10*3/uL (ref 0.1–1.0)
MONOS PCT: 9 % (ref 3–12)
Neutro Abs: 3.2 10*3/uL (ref 1.7–7.7)
Neutrophils Relative %: 57 % (ref 43–77)
Platelets: 370 10*3/uL (ref 150–400)
RBC: 4.52 MIL/uL (ref 3.87–5.11)
RDW: 12.8 % (ref 11.5–15.5)
WBC: 5.7 10*3/uL (ref 4.0–10.5)

## 2014-04-21 NOTE — Progress Notes (Signed)
Patient here to establish care Presently taking prozac

## 2014-04-21 NOTE — Progress Notes (Signed)
Patient Demographics  Carrie [name], is a 43 y.o. female  ZOX:[id number]  [id number]  DOB - [birth year]  CC:  Chief Complaint  Patient presents with  . Establish Care       HPI: Carrie [name] is a 43 y.o. female here today to establish medical care.Patient has history of chronic back pain, recently went to the emergency room with symptoms of acute on chronic back pain, she was treated with her short course of narcotic and NSAIDs, patient had MRI lumbar spine into the ventral which reported degeneration and bulging of the disc at L2-L3. Patient also had her cervical MRI done which reported mild DJD and central protrusion at C5-C6. As per patient she had seen orthopedic doctors in the past and was recommended for pain management which she is requesting referral, she also reported to have joint stiffness in the hands especially in the morning she also reported a family history of rheumatoid arthritis, she has history of bipolar disorder and following up with her psychiatrist and is on BuSpar clonidine Prozac and Lamictal. Patient has No headache, No chest pain, No abdominal pain - No Nausea, No new weakness tingling or numbness, No Cough - SOB.  No Known Allergies Past Medical History  Diagnosis Date  . Chronic back pain    Current Outpatient Prescriptions on File Prior to Visit  Medication Sig Dispense Refill  . busPIRone (BUSPAR) 15 MG tablet Take 15 mg by mouth daily as needed (Anxiety).      . cloNIDine (CATAPRES) 0.1 MG tablet Take 0.1 mg by mouth 2 (two) times daily.      Marland Kitchen FLUoxetine (PROZAC) 40 MG capsule Take 40 mg by mouth daily.      Marland Kitchen HYDROcodone-acetaminophen (NORCO) 5-325 MG per tablet Take 2 tablets by mouth every 4 (four) hours as needed.  10 tablet  0  . lamoTRIgine (LAMICTAL) 25 MG tablet Take 25 mg by mouth 2 (two) times daily.      . naproxen (NAPROSYN) 500 MG tablet Take 1 tablet (500 mg total) by mouth 2 (two) times daily.  30 tablet  0   No current  facility-administered medications on file prior to visit.   Family History  Problem Relation Age of Onset  . Cancer Mother     breast cancer   . Multiple sclerosis Mother   . Thyroid disease Mother   . Cancer Maternal Grandmother    History   Social History  . Marital Status: Married    Spouse Name: N/A    Number of Children: N/A  . Years of Education: N/A   Occupational History  . Not on file.   Social History Main Topics  . Smoking status: Current Every Day Smoker  . Smokeless tobacco: Not on file  . Alcohol Use: Yes     Comment: OCCASIONAL  . Drug Use: Yes    Special: Cocaine, Marijuana     Comment: reports cocaine addict 10 yrs ago  . Sexual Activity: Not on file   Other Topics Concern  . Not on file   Social History Narrative  . No narrative on file    Review of Systems: Constitutional: Negative for fever, chills, diaphoresis, activity change, appetite change and fatigue. HENT: Negative for ear pain, nosebleeds, congestion, facial swelling, rhinorrhea, neck pain, neck stiffness and ear discharge.  Eyes: Negative for pain, discharge, redness, itching and visual disturbance. Respiratory: Negative for cough, choking, chest tightness, shortness of breath, wheezing and stridor.  Cardiovascular: Negative for chest  pain, palpitations and leg swelling. Gastrointestinal: Negative for abdominal distention. Genitourinary: Negative for dysuria, urgency, frequency, hematuria, flank pain, decreased urine volume, difficulty urinating and dyspareunia.  Musculoskeletal: Negative for back pain, joint swelling, arthralgia and gait problem. Neurological: Negative for dizziness, tremors, seizures, syncope, facial asymmetry, speech difficulty, weakness, light-headedness, numbness and headaches.  Hematological: Negative for adenopathy. Does not bruise/bleed easily. Psychiatric/Behavioral: Negative for hallucinations, behavioral problems, confusion, dysphoric mood, decreased  concentration and agitation.    Objective:   Filed Vitals:   04/21/14 0916  BP: 111/67  Pulse: 70  Temp: 97.8 F (36.6 C)  Resp: 15    Physical Exam: Constitutional: Patient appears well-developed and well-nourished. No distress. HENT: Normocephalic, atraumatic, External right and left ear normal. Oropharynx is clear and moist.  Eyes: Conjunctivae and EOM are normal. PERRLA, no scleral icterus. Neck: Normal ROM. Neck supple. No JVD. No tracheal deviation. No thyromegaly. CVS: RRR, S1/S2 +, no murmurs, no gallops, no carotid bruit.  Pulmonary: Effort and breath sounds normal, no stridor, rhonchi, wheezes, rales.  Abdominal: Soft. BS +, no distension, tenderness, rebound or guarding.  Musculoskeletal: Normal range of motion. No edema and no tenderness.  Neuro: Alert. Normal reflexes, muscle tone coordination. No cranial nerve deficit. Skin: Skin is warm and dry. No rash noted. Not diaphoretic. No erythema. No pallor. Psychiatric: Normal mood and affect. Behavior, judgment, thought content normal.  Lab Results  Component Value Date   WBC 5.9 01/25/2009   HGB 13.9 12/02/2009   HCT 41.0 12/02/2009   MCV 94.3 01/25/2009   PLT 178 01/25/2009   Lab Results  Component Value Date   CREATININE 0.8 12/02/2009   BUN 7 12/02/2009   NA 140 12/02/2009   K 4.0 12/02/2009   CL 106 12/02/2009   CO2 21 04/21/2008    No results found for this basename: HGBA1C   Lipid Panel  No results found for this basename: chol, trig, hdl, cholhdl, vldl, ldlcalc       Assessment and plan:   1. Bipolar disorder, unspecified Patient is following up with his test. Denies any SI or HI.  2. Chronic low back pain  - Ambulatory referral to Pain Clinic  3. Screening Ordered baseline blood work.   - CBC with Differential - COMPLETE METABOLIC PANEL WITH GFR - TSH - Lipid panel - Vit D  25 hydroxy (rtn osteoporosis monitoring)  4. Morning stiffness of joints Will check, patient has family history of  rheumatoid arthritis. - Rheumatoid factor - ANA        Health Maintenance  -Pap Smear: referred to GYN -Mammogram: upotodate   Return in about 3 months (around 07/22/2014) for back pain.

## 2014-04-22 ENCOUNTER — Telehealth: Payer: Self-pay

## 2014-04-22 LAB — ANA: ANA: NEGATIVE

## 2014-04-22 LAB — RHEUMATOID FACTOR: Rhuematoid fact SerPl-aCnc: <10 IU/mL (ref <=14)

## 2014-04-22 LAB — LIPID PANEL
CHOL/HDL RATIO: 5.9 ratio
CHOLESTEROL: 218 mg/dL — AB (ref 0–200)
HDL: 37 mg/dL — ABNORMAL LOW (ref >39)
LDL Cholesterol: 141 mg/dL — ABNORMAL HIGH (ref 0–99)
Triglycerides: 201 mg/dL — ABNORMAL HIGH (ref <150)
VLDL: 40 mg/dL (ref 0–40)

## 2014-04-22 LAB — COMPLETE METABOLIC PANEL WITH GFR
ALT: 22 U/L (ref 0–35)
AST: 29 U/L (ref 0–37)
Albumin: 4.2 g/dL (ref 3.5–5.2)
Alkaline Phosphatase: 77 U/L (ref 39–117)
BUN: 8 mg/dL (ref 6–23)
CALCIUM: 9.5 mg/dL (ref 8.4–10.5)
CHLORIDE: 105 meq/L (ref 96–112)
CO2: 27 mEq/L (ref 19–32)
CREATININE: 0.83 mg/dL (ref 0.50–1.10)
GFR, EST NON AFRICAN AMERICAN: 88 mL/min
GLUCOSE: 55 mg/dL — AB (ref 70–99)
Potassium: 5 mEq/L (ref 3.5–5.3)
Sodium: 142 mEq/L (ref 135–145)
Total Bilirubin: 0.3 mg/dL (ref 0.2–1.2)
Total Protein: 6.9 g/dL (ref 6.0–8.3)

## 2014-04-22 LAB — VITAMIN D 25 HYDROXY (VIT D DEFICIENCY, FRACTURES): VIT D 25 HYDROXY: 38 ng/mL (ref 30–89)

## 2014-04-22 LAB — TSH: TSH: 1.386 u[IU]/mL (ref 0.350–4.500)

## 2014-04-22 NOTE — Telephone Encounter (Signed)
Patient is aware of her lab results States at times feels a little light headed and shaky But overall  Feels good Instructed her to carry hard candy for when she starts to feel  Light headed

## 2014-04-22 NOTE — Telephone Encounter (Signed)
Message copied by Lestine MountJUAREZ, Saphira Lahmann L on Wed Apr 22, 2014 11:17 AM ------      Message from: Doris CheadleADVANI, DEEPAK      Created: Wed Apr 22, 2014 11:10 AM       Blood work reviewed noticed low blood sugar level, call and check with the patient how she is doing if symptomatic advised to drink orange, also noticed hyperlipidemia advised patient for low fat diet, also her test for lupus and rheumatoid is negative. ------

## 2014-05-06 ENCOUNTER — Ambulatory Visit: Payer: Medicaid Other | Admitting: Internal Medicine

## 2014-05-07 ENCOUNTER — Emergency Department (HOSPITAL_COMMUNITY)
Admission: EM | Admit: 2014-05-07 | Discharge: 2014-05-07 | Disposition: A | Discharge status: Home / Self Care | Payer: Medicaid Other | Attending: Emergency Medicine | Admitting: Emergency Medicine

## 2014-05-07 ENCOUNTER — Encounter (HOSPITAL_COMMUNITY): Payer: Self-pay | Admitting: Emergency Medicine

## 2014-05-07 ENCOUNTER — Emergency Department (HOSPITAL_COMMUNITY): Payer: Medicaid Other

## 2014-05-07 DIAGNOSIS — F17200 Nicotine dependence, unspecified, uncomplicated: Secondary | ICD-10-CM | POA: Insufficient documentation

## 2014-05-07 DIAGNOSIS — G8929 Other chronic pain: Secondary | ICD-10-CM | POA: Diagnosis not present

## 2014-05-07 DIAGNOSIS — Z862 Personal history of diseases of the blood and blood-forming organs and certain disorders involving the immune mechanism: Secondary | ICD-10-CM | POA: Diagnosis not present

## 2014-05-07 DIAGNOSIS — M25511 Pain in right shoulder: Secondary | ICD-10-CM

## 2014-05-07 DIAGNOSIS — Z79899 Other long term (current) drug therapy: Secondary | ICD-10-CM | POA: Diagnosis not present

## 2014-05-07 DIAGNOSIS — M25519 Pain in unspecified shoulder: Secondary | ICD-10-CM | POA: Diagnosis not present

## 2014-05-07 DIAGNOSIS — Z8639 Personal history of other endocrine, nutritional and metabolic disease: Secondary | ICD-10-CM | POA: Insufficient documentation

## 2014-05-07 HISTORY — DX: Pure hypercholesterolemia, unspecified: E78.00

## 2014-05-07 MED ORDER — MELOXICAM 7.5 MG PO TABS: 7.5000 mg | ORAL_TABLET | Freq: Every day | ORAL | Status: DC

## 2014-05-07 MED ORDER — IBUPROFEN 400 MG PO TABS
600.0000 mg | ORAL_TABLET | Freq: Once | ORAL | Status: AC
  Administered 2014-05-07: 600 mg via ORAL
  Filled 2014-05-07 (×2): qty 1

## 2014-05-07 MED ORDER — METHOCARBAMOL 500 MG PO TABS
1000.0000 mg | ORAL_TABLET | Freq: Once | ORAL | Status: AC
  Administered 2014-05-07: 1000 mg via ORAL
  Filled 2014-05-07: qty 2

## 2014-05-07 MED ORDER — METHOCARBAMOL 500 MG PO TABS: 1000.0000 mg | ORAL_TABLET | Freq: Four times a day (QID) | ORAL | Status: DC

## 2014-05-07 NOTE — ED Notes (Signed)
Pt. reports right shoulder joint pain radiating to right arm onset this evening worse with movement and palpation  , denies injury or fall . Respirations unlabored.

## 2014-05-07 NOTE — ED Provider Notes (Signed)
Medical screening examination/treatment/procedure(s) were performed by non-physician practitioner and as supervising physician I was immediately available for consultation/collaboration.   EKG Interpretation None       Brinlynn Gorton K Latice Waitman-Rasch, MD 05/07/14 [phone]

## 2014-05-07 NOTE — Discharge Instructions (Signed)
Please read and follow all provided instructions.  Your diagnoses today include:  1. Shoulder pain, acute, right    Tests performed today include:  An x-ray of the affected area - does NOT show any broken bones, shows cartilage overgrowth in upper arm that is not dangerous  Vital signs. See below for your results today.   Medications prescribed:   Meloxicam - anti-inflammatory pain medication  You have been prescribed an anti-inflammatory medication or NSAID. Take with food. Do not take aspirin, ibuprofen, or naproxen if taking this medication. Take smallest effective dose for the shortest duration needed for your pain. Stop taking if you experience stomach pain or vomiting.    Robaxin (methocarbamol) - muscle relaxer medication  DO NOT drive or perform any activities that require you to be awake and alert because this medicine can make you drowsy.   Take any prescribed medications only as directed.  Home care instructions:   Follow any educational materials contained in this packet  Follow R.I.C.E. Protocol:  R - rest your injury   I  - use ice on injury without applying directly to skin  C - compress injury with bandage or splint  E - elevate the injury as much as possible  Follow-up instructions: Please follow-up with your primary care provider or the provided orthopedic physician (bone specialist) if you continue to have significant pain in 1 week. In this case you may have a severe injury that requires further care.   Return instructions:   Please return if your toes are numb or tingling, appear gray or blue, or you have severe pain (also elevate leg and loosen splint or wrap if you were given one)  Please return to the Emergency Department if you experience worsening symptoms.   Please return if you have any other emergent concerns.  Additional Information:  Your vital signs today were: BP 101/60   Pulse 60   Temp(Src) 98.2 F (36.8 C) (Oral)   Resp 14   Ht  5' 8.5" (1.74 m)   Wt 151 lb (68.493 kg)   BMI 22.62 kg/m2   SpO2 98%   LMP 04/12/2014 If your blood pressure (BP) was elevated above 135/85 this visit, please have this repeated by your doctor within one month. --------------

## 2014-05-07 NOTE — ED Provider Notes (Signed)
CSN: [id number]     Arrival date & time 05/07/14  0325 History   First MD Initiated Contact with Patient 05/07/14 [phone]     Chief Complaint  Patient presents with  . Shoulder Pain     (Consider location/radiation/quality/duration/timing/severity/associated sxs/prior Treatment) HPI Comments: Patient with history of chronic back pain, substance abuse -- presents with complaint of right shoulder pain that began last evening. Patient states that she was sweeping and mopping yesterday. She denies acute injury or fall. Patient had gradual onset of pain that has worsened. It is described as a soreness. It radiates down into her upper arm. She states that she is having trouble lifting her arm up above her head. She denies numbness or tingling. No treatments prior to arrival. No neck pain or injury. The onset of this condition was acute. The course is constant. Aggravating factors: movement. Alleviating factors: none.    The history is provided by the patient.    Past Medical History  Diagnosis Date  . Chronic back pain   . Hypercholesteremia    Past Surgical History  Procedure Laterality Date  . Breast lumpectomy    . Dilation and curettage of uterus    . Cesarean section     Family History  Problem Relation Age of Onset  . Cancer Mother     breast cancer   . Multiple sclerosis Mother   . Thyroid disease Mother   . Cancer Maternal Grandmother    History  Substance Use Topics  . Smoking status: Current Every Day Smoker  . Smokeless tobacco: Not on file  . Alcohol Use: Yes     Comment: OCCASIONAL   OB History   Grav Para Term Preterm Abortions TAB SAB Ect Mult Living                 Review of Systems  Constitutional: Positive for activity change.  Musculoskeletal: Positive for arthralgias. Negative for back pain, joint swelling and neck pain.  Skin: Negative for wound.  Neurological: Positive for weakness. Negative for numbness.    Allergies  Review of patient's allergies  indicates no known allergies.  Home Medications   Prior to Admission medications   Medication Sig Start Date End Date Taking? Authorizing Provider  busPIRone (BUSPAR) 15 MG tablet Take 15 mg by mouth See admin instructions. Take 1 tablet in the morning, and 1 additional tablet daily as needed for anxiety.   Yes Historical Provider, MD  cloNIDine (CATAPRES) 0.1 MG tablet Take 0.1 mg by mouth 2 (two) times daily.   Yes Historical Provider, MD  doxepin (SINEQUAN) 25 MG capsule Take 25 mg by mouth daily.   Yes Historical Provider, MD  FLUoxetine (PROZAC) 40 MG capsule Take 40 mg by mouth daily.   Yes Historical Provider, MD  lamoTRIgine (LAMICTAL) 25 MG tablet Take 25 mg by mouth 2 (two) times daily.   Yes Historical Provider, MD   BP 101/60  Pulse 60  Temp(Src) 98.2 F (36.8 C) (Oral)  Resp 14  Ht 5' 8.5" (1.74 m)  Wt 151 lb (68.493 kg)  BMI 22.62 kg/m2  SpO2 98%  LMP 04/12/2014  Physical Exam  Nursing note and vitals reviewed. Constitutional: She appears well-developed and well-nourished.  HENT:  Head: Normocephalic and atraumatic.  Eyes: Conjunctivae are normal.  Neck: Normal range of motion. Neck supple.  Pulmonary/Chest: No respiratory distress.  Musculoskeletal:       Right shoulder: She exhibits decreased range of motion and tenderness (lateral delt, anterior delt tenderness  to palp). She exhibits no bony tenderness and no deformity.       Left shoulder: Normal.       Right elbow: Normal.      Right wrist: Normal.       Cervical back: Normal. She exhibits normal range of motion, no tenderness and no bony tenderness.       Thoracic back: Normal.       Lumbar back: Normal.       Left upper arm: She exhibits tenderness. She exhibits no bony tenderness and no swelling.  Neurological: She is alert.  Skin: Skin is warm and dry.  Psychiatric: She has a normal mood and affect.    ED Course  Procedures (including critical care time) Labs Review Labs Reviewed - No data to  display  Imaging Review Dg Shoulder Right  05/07/2014   CLINICAL DATA:  SHOULDER PAIN  EXAM: RIGHT SHOULDER - 2+ VIEW  COMPARISON:  None.  FINDINGS: There is no evidence of fracture or dislocation. There is no evidence of arthropathy. Focal 1.8 cm lesion that demonstrates chondroid matrix in the metaphyseal region of the proximal right humerus most likely represents an enchondroma. No soft tissue component or aggressive features.  IMPRESSION: 1. No acute abnormality about the right shoulder. 2. Probable enchondroma within the proximal right humerus.   Electronically Signed   By: Rise MuBenjamin  McClintock M.D.   On: 05/07/2014 04:17     EKG Interpretation None      6:14 AM Patient seen and examined. Work-up initiated. Medications ordered.   Vital signs reviewed and are as follows: Filed Vitals:   05/07/14 0600  BP: 101/60  Pulse: 60  Temp:   Resp: 14   Sling provided. Discussed RICE protocol. Patient encouraged to follow up with orthopedics in one week if she still has pain.  Patient urged to return with worsening symptoms or other concerns. Patient verbalized understanding and agrees with plan.   Patient counseled on proper use of muscle relaxant medication.  They were told not to drink alcohol, drive any vehicle, or do any dangerous activities while taking this medication.  Patient verbalized understanding.   MDM   Final diagnoses:  Shoulder pain, acute, right   Patients with right shoulder pain likely musculoskeletal due to overuse. Tendinitis, bursitis, sprain on differential. X-ray shows no acute injury. Ortho followup if not improving with conservative management.   Renne CriglerJoshua Jihan Rudy, PA-C 05/07/14 [phone]

## 2014-06-10 ENCOUNTER — Emergency Department (HOSPITAL_COMMUNITY)
Admission: EM | Admit: 2014-06-10 | Discharge: 2014-06-10 | Disposition: A | Discharge status: Home / Self Care | Payer: Medicaid Other | Attending: Emergency Medicine | Admitting: Emergency Medicine

## 2014-06-10 ENCOUNTER — Encounter (HOSPITAL_COMMUNITY): Payer: Self-pay | Admitting: Emergency Medicine

## 2014-06-10 DIAGNOSIS — K029 Dental caries, unspecified: Secondary | ICD-10-CM | POA: Diagnosis not present

## 2014-06-10 DIAGNOSIS — Z79899 Other long term (current) drug therapy: Secondary | ICD-10-CM | POA: Insufficient documentation

## 2014-06-10 DIAGNOSIS — Z862 Personal history of diseases of the blood and blood-forming organs and certain disorders involving the immune mechanism: Secondary | ICD-10-CM | POA: Insufficient documentation

## 2014-06-10 DIAGNOSIS — Z8639 Personal history of other endocrine, nutritional and metabolic disease: Secondary | ICD-10-CM | POA: Insufficient documentation

## 2014-06-10 DIAGNOSIS — K089 Disorder of teeth and supporting structures, unspecified: Secondary | ICD-10-CM | POA: Diagnosis present

## 2014-06-10 DIAGNOSIS — F17200 Nicotine dependence, unspecified, uncomplicated: Secondary | ICD-10-CM | POA: Diagnosis not present

## 2014-06-10 DIAGNOSIS — G8929 Other chronic pain: Secondary | ICD-10-CM | POA: Diagnosis not present

## 2014-06-10 DIAGNOSIS — Z791 Long term (current) use of non-steroidal anti-inflammatories (NSAID): Secondary | ICD-10-CM | POA: Diagnosis not present

## 2014-06-10 MED ORDER — PENICILLIN V POTASSIUM 500 MG PO TABS: 500.0000 mg | ORAL_TABLET | Freq: Three times a day (TID) | ORAL | Status: DC

## 2014-06-10 MED ORDER — MELOXICAM 7.5 MG PO TABS: 7.5000 mg | ORAL_TABLET | Freq: Every day | ORAL | Status: DC

## 2014-06-10 MED ORDER — IBUPROFEN 400 MG PO TABS: 800.0000 mg | ORAL_TABLET | Freq: Once | ORAL | Status: DC

## 2014-06-10 NOTE — ED Provider Notes (Signed)
CSN: [id number]     Arrival date & time 06/10/14  0012 History   First MD Initiated Contact with Patient 06/10/14 0038     Chief Complaint  Patient presents with  . Dental Pain     (Consider location/radiation/quality/duration/timing/severity/associated sxs/prior Treatment) HPI  43 year old female with hx of chronic back pain presents c/o dental pain.  Pt report gradual onset of sharp throbbing pain to 4 separate teeth for the past 2-3 days.  Pain is persistent, severe, worsening with cold air, chewing and talking.  NO fever, chills, runny nose, sneeze, cough, sore throat, neck pain or rash.  Pt has tried to schedule appointment with a dentist for next week but cannot bear the pain.  Has tried naproxen at home with minimal relief.   Past Medical History  Diagnosis Date  . Chronic back pain   . Hypercholesteremia    Past Surgical History  Procedure Laterality Date  . Breast lumpectomy    . Dilation and curettage of uterus    . Cesarean section     Family History  Problem Relation Age of Onset  . Cancer Mother     breast cancer   . Multiple sclerosis Mother   . Thyroid disease Mother   . Cancer Maternal Grandmother    History  Substance Use Topics  . Smoking status: Current Every Day Smoker  . Smokeless tobacco: Not on file  . Alcohol Use: Yes     Comment: OCCASIONAL   OB History   Grav Para Term Preterm Abortions TAB SAB Ect Mult Living                 Review of Systems  Constitutional: Negative for fever.  HENT: Positive for dental problem.   Neurological: Negative for numbness.      Allergies  Review of patient's allergies indicates no known allergies.  Home Medications   Prior to Admission medications   Medication Sig Start Date End Date Taking? Authorizing Provider  busPIRone (BUSPAR) 15 MG tablet Take 15 mg by mouth See admin instructions. Take 1 tablet in the morning, and 1 additional tablet daily as needed for anxiety.    Historical Provider, MD   cloNIDine (CATAPRES) 0.1 MG tablet Take 0.1 mg by mouth 2 (two) times daily.    Historical Provider, MD  doxepin (SINEQUAN) 25 MG capsule Take 25 mg by mouth daily.    Historical Provider, MD  FLUoxetine (PROZAC) 40 MG capsule Take 40 mg by mouth daily.    Historical Provider, MD  lamoTRIgine (LAMICTAL) 25 MG tablet Take 25 mg by mouth 2 (two) times daily.    Historical Provider, MD  meloxicam (MOBIC) 7.5 MG tablet Take 1 tablet (7.5 mg total) by mouth daily. 05/07/14   Renne Crigler, PA-C  methocarbamol (ROBAXIN) 500 MG tablet Take 2 tablets (1,000 mg total) by mouth 4 (four) times daily. 05/07/14   Renne Crigler, PA-C   BP 111/71  Pulse 73  Temp(Src) 98.6 F (37 C) (Oral)  Resp 16  Ht  (1.727 m)  Wt 150 lb (68.04 kg)  BMI 22.81 kg/m2  SpO2 97% Physical Exam  Nursing note and vitals reviewed. Constitutional: She appears well-developed and well-nourished. No distress.  HENT:  Head: Atraumatic.  Mouth: poor dentition.  Tenderness to teeth 1,2,16,31 without abscess, no trismus, no gingival erythema.    Eyes: Conjunctivae are normal.  Neck: Neck supple.  Neurological: She is alert.  Skin: No rash noted.  Psychiatric: She has a normal mood  and affect.    ED Course  Procedures (including critical care time)  12:48 AM Pt here with dental pain to several decay tooth.  No obvious abscess on exam.  Hx of chronic back pain.  Will provide NSAIDs and abx.  Dental referral.  Return precaution discussed.  No airway compromise.    Labs Review Labs Reviewed - No data to display  Imaging Review No results found.   EKG Interpretation None      MDM   Final diagnoses:  Pain due to dental caries    BP 111/71  Pulse 73  Temp(Src) 98.6 F (37 C) (Oral)  Resp 16  Ht  (1.727 m)  Wt 150 lb (68.04 kg)  BMI 22.81 kg/m2  SpO2 97%     Fayrene Helper, PA-C 06/10/14 0049

## 2014-06-10 NOTE — ED Notes (Signed)
Pt c/o upper and lower right dental pain, and left lower dental pain for 2-3 days.

## 2014-06-10 NOTE — Discharge Instructions (Signed)

## 2014-06-10 NOTE — ED Notes (Signed)
Pt requesting pain medication prior to discharge.  

## 2014-06-10 NOTE — ED Provider Notes (Signed)
Medical screening examination/treatment/procedure(s) were performed by non-physician practitioner and as supervising physician I was immediately available for consultation/collaboration.   EKG Interpretation None       Liora Myles M Shaneil Yazdi, MD 06/10/14 0428 

## 2014-07-04 ENCOUNTER — Emergency Department (HOSPITAL_COMMUNITY)
Admission: EM | Admit: 2014-07-04 | Discharge: 2014-07-04 | Disposition: A | Discharge status: Home / Self Care | Payer: Medicaid Other | Attending: Emergency Medicine | Admitting: Emergency Medicine

## 2014-07-04 ENCOUNTER — Encounter (HOSPITAL_COMMUNITY): Payer: Self-pay | Admitting: Emergency Medicine

## 2014-07-04 DIAGNOSIS — Z79899 Other long term (current) drug therapy: Secondary | ICD-10-CM | POA: Insufficient documentation

## 2014-07-04 DIAGNOSIS — Z8639 Personal history of other endocrine, nutritional and metabolic disease: Secondary | ICD-10-CM | POA: Diagnosis not present

## 2014-07-04 DIAGNOSIS — Z862 Personal history of diseases of the blood and blood-forming organs and certain disorders involving the immune mechanism: Secondary | ICD-10-CM | POA: Diagnosis not present

## 2014-07-04 DIAGNOSIS — G8929 Other chronic pain: Secondary | ICD-10-CM | POA: Insufficient documentation

## 2014-07-04 DIAGNOSIS — K0889 Other specified disorders of teeth and supporting structures: Secondary | ICD-10-CM

## 2014-07-04 DIAGNOSIS — K089 Disorder of teeth and supporting structures, unspecified: Secondary | ICD-10-CM | POA: Insufficient documentation

## 2014-07-04 DIAGNOSIS — Z792 Long term (current) use of antibiotics: Secondary | ICD-10-CM | POA: Diagnosis not present

## 2014-07-04 DIAGNOSIS — F17200 Nicotine dependence, unspecified, uncomplicated: Secondary | ICD-10-CM | POA: Diagnosis not present

## 2014-07-04 DIAGNOSIS — K08109 Complete loss of teeth, unspecified cause, unspecified class: Secondary | ICD-10-CM | POA: Insufficient documentation

## 2014-07-04 MED ORDER — HYDROMORPHONE HCL 1 MG/ML IJ SOLN
2.0000 mg | Freq: Once | INTRAMUSCULAR | Status: AC
  Administered 2014-07-04: 2 mg via INTRAMUSCULAR
  Filled 2014-07-04: qty 2

## 2014-07-04 MED ORDER — OXYCODONE-ACETAMINOPHEN 5-325 MG PO TABS
1.0000 | ORAL_TABLET | Freq: Once | ORAL | Status: AC
  Administered 2014-07-04: 1 via ORAL
  Filled 2014-07-04: qty 1

## 2014-07-04 MED ORDER — OXYCODONE-ACETAMINOPHEN 10-325 MG PO TABS
1.0000 | ORAL_TABLET | ORAL | Status: DC | PRN
Start: 2014-07-04 — End: 2015-09-17

## 2014-07-04 NOTE — ED Notes (Signed)
Dental box available at the bedside.

## 2014-07-04 NOTE — ED Notes (Signed)
The pt had oral surgery   Sept 18.  She is having severe pain now and the pain med is not helpoing

## 2014-07-04 NOTE — Discharge Instructions (Signed)

## 2014-07-05 NOTE — ED Provider Notes (Signed)
CSN: [id number]     Arrival date & time 07/04/14  0236 History   First MD Initiated Contact with Patient 07/04/14 [phone]     Chief Complaint  Patient presents with  . Dental Problem     (Consider location/radiation/quality/duration/timing/severity/associated sxs/prior Treatment) Patient is a 43 y.o. female presenting with tooth pain. The history is provided by the patient. No language interpreter was used.  Dental Pain Quality:  Sharp Severity:  Severe Timing:  Constant Progression:  Worsening Chronicity:  New Context comment:  Had 5 extractions on 9/18 Relieved by:  Nothing Worsened by:  Nothing tried Ineffective treatments: percocet 5-325. Associated symptoms: facial pain and trismus   Associated symptoms: no congestion, no difficulty swallowing, no drooling, no facial swelling, no fever, no gum swelling, no headaches, no neck pain and no oral bleeding   Risk factors: periodontal disease and smoking     Past Medical History  Diagnosis Date  . Chronic back pain   . Hypercholesteremia    Past Surgical History  Procedure Laterality Date  . Breast lumpectomy    . Dilation and curettage of uterus    . Cesarean section     Family History  Problem Relation Age of Onset  . Cancer Mother     breast cancer   . Multiple sclerosis Mother   . Thyroid disease Mother   . Cancer Maternal Grandmother    History  Substance Use Topics  . Smoking status: Current Every Day Smoker  . Smokeless tobacco: Not on file  . Alcohol Use: Yes     Comment: OCCASIONAL   OB History   Grav Para Term Preterm Abortions TAB SAB Ect Mult Living                 Review of Systems  Constitutional: Negative for fever, chills, diaphoresis, activity change, appetite change and fatigue.  HENT: Negative for congestion, drooling, facial swelling, rhinorrhea and sore throat.   Eyes: Negative for photophobia and discharge.  Respiratory: Negative for cough, chest tightness and shortness of breath.    Cardiovascular: Negative for chest pain, palpitations and leg swelling.  Gastrointestinal: Negative for nausea, vomiting, abdominal pain and diarrhea.  Endocrine: Negative for polydipsia and polyuria.  Genitourinary: Negative for dysuria, frequency, difficulty urinating and pelvic pain.  Musculoskeletal: Negative for arthralgias, back pain, neck pain and neck stiffness.  Skin: Negative for color change and wound.  Allergic/Immunologic: Negative for immunocompromised state.  Neurological: Negative for facial asymmetry, weakness, numbness and headaches.  Hematological: Does not bruise/bleed easily.  Psychiatric/Behavioral: Negative for confusion and agitation.      Allergies  Review of patient's allergies indicates no known allergies.  Home Medications   Prior to Admission medications   Medication Sig Start Date End Date Taking? Authorizing Provider  busPIRone (BUSPAR) 15 MG tablet Take 15 mg by mouth See admin instructions. Take 1 tablet in the morning, and 1 additional tablet daily as needed for anxiety.   Yes Historical Provider, MD  doxepin (SINEQUAN) 25 MG capsule Take 25 mg by mouth daily.   Yes Historical Provider, MD  FLUoxetine (PROZAC) 40 MG capsule Take 40 mg by mouth daily.   Yes Historical Provider, MD  ibuprofen (ADVIL,MOTRIN) 200 MG tablet Take 200 mg by mouth every 6 (six) hours as needed for moderate pain.   Yes Historical Provider, MD  lamoTRIgine (LAMICTAL) 25 MG tablet Take 25 mg by mouth 2 (two) times daily.   Yes Historical Provider, MD  oxyCODONE-acetaminophen (PERCOCET/ROXICET) 5-325 MG per tablet Take  1 tablet by mouth every 4 (four) hours as needed for severe pain.   Yes Historical Provider, MD  penicillin v potassium (VEETID) 500 MG tablet Take 1 tablet (500 mg total) by mouth 3 (three) times daily. 06/10/14  Yes Fayrene Helper, PA-C  oxyCODONE-acetaminophen (PERCOCET) 10-325 MG per tablet Take 1 tablet by mouth every 4 (four) hours as needed for pain. 07/04/14   Toy Cookey, MD   BP 115/63  Pulse 68  Temp(Src) 98.5 F (36.9 C) (Oral)  Resp 18  Ht  (1.727 m)  Wt 163 lb (73.936 kg)  BMI 24.79 kg/m2  SpO2 98% Physical Exam  Constitutional: She is oriented to person, place, and time. She appears well-developed and well-nourished. No distress.  HENT:  Head: Normocephalic and atraumatic.  Mouth/Throat: No oropharyngeal exudate.  Extraction sites on upper and lower teeth are well-appearing, w/o signs of surrounding gum edema or drainage.   Eyes: Pupils are equal, round, and reactive to light.  Neck: Normal range of motion. Neck supple.  Cardiovascular: Normal rate, regular rhythm and normal heart sounds.  Exam reveals no gallop and no friction rub.   No murmur heard. Pulmonary/Chest: Effort normal and breath sounds normal. No respiratory distress. She has no wheezes. She has no rales.  Abdominal: Soft. Bowel sounds are normal. She exhibits no distension and no mass. There is no tenderness. There is no rebound and no guarding.  Musculoskeletal: Normal range of motion. She exhibits no edema and no tenderness.  Neurological: She is alert and oriented to person, place, and time.  Skin: Skin is warm and dry.  Psychiatric: She has a normal mood and affect.    ED Course  Procedures (including critical care time) Labs Review Labs Reviewed - No data to display  Imaging Review No results found.   EKG Interpretation None      MDM   Final diagnoses:  Pain, dental    Pt is a 43 y.o. female with Pmhx as above who presents with continued dental pain. She had 5 teeth pulled 9/18 and continues to have pain. She states she feels like one of her teeth broke off during the surgery and part is still stuck in her gums. She saw her dental surgeon 2 days ago and he said she was healing well. She denies fevers. On PE, VSS, pt uncomforable but in NAD. Extraction sites appears to be healing well.  IM dilaudid given, but pt will need to continue outpt f/u  with dental surgeon for continued symptoms. Her hx of chronic pain is likely contributing to her poor pain control.         Toy Cookey, MD 07/05/14 [phone]

## 2014-07-10 ENCOUNTER — Encounter (HOSPITAL_COMMUNITY): Payer: Self-pay | Admitting: Emergency Medicine

## 2014-07-10 ENCOUNTER — Emergency Department (HOSPITAL_COMMUNITY)
Admission: EM | Admit: 2014-07-10 | Discharge: 2014-07-10 | Disposition: A | Discharge status: Home / Self Care | Payer: Medicaid Other | Attending: Emergency Medicine | Admitting: Emergency Medicine

## 2014-07-10 DIAGNOSIS — G8929 Other chronic pain: Secondary | ICD-10-CM | POA: Insufficient documentation

## 2014-07-10 DIAGNOSIS — Z8639 Personal history of other endocrine, nutritional and metabolic disease: Secondary | ICD-10-CM | POA: Insufficient documentation

## 2014-07-10 DIAGNOSIS — Z79899 Other long term (current) drug therapy: Secondary | ICD-10-CM | POA: Insufficient documentation

## 2014-07-10 DIAGNOSIS — Z720 Tobacco use: Secondary | ICD-10-CM | POA: Insufficient documentation

## 2014-07-10 DIAGNOSIS — K088 Other specified disorders of teeth and supporting structures: Secondary | ICD-10-CM | POA: Diagnosis not present

## 2014-07-10 DIAGNOSIS — K029 Dental caries, unspecified: Secondary | ICD-10-CM | POA: Insufficient documentation

## 2014-07-10 DIAGNOSIS — K0889 Other specified disorders of teeth and supporting structures: Secondary | ICD-10-CM

## 2014-07-10 MED ORDER — HYDROMORPHONE HCL 1 MG/ML IJ SOLN
2.0000 mg | Freq: Once | INTRAMUSCULAR | Status: AC
  Administered 2014-07-10: 2 mg via INTRAMUSCULAR
  Filled 2014-07-10: qty 2

## 2014-07-10 MED ORDER — OXYCODONE-ACETAMINOPHEN 10-325 MG PO TABS: 1.0000 | ORAL_TABLET | Freq: Four times a day (QID) | ORAL | Status: DC | PRN

## 2014-07-10 NOTE — ED Notes (Signed)
Patient arrives with complaint of dental pain. States that the pain started last month on the 18th of Sept after having oral surgeon remove some teeth. Since that time the pain has become worse. States that she was seen here about a week after the surgery and was prescribed some pain medication. States that she used all of it and has been taking her Abx but the pain has persisted. Spoke with oral surgeon earlier this week and was told they couldn't get her in for appointment until Monday. Presents primarily for pain control until she is able to see surgeon on Monday. Denies fevers, bleeding, drainage. Reports some swelling on the upper right.

## 2014-07-10 NOTE — Discharge Instructions (Signed)

## 2014-07-10 NOTE — ED Provider Notes (Signed)
CSN: [id number]     Arrival date & time 07/10/14  2159 History  This chart was scribed for non-physician practitioner, Felicie Mornavid Adonica Fukushima, FNP,working with Hurman HornJohn M Bednar, MD, by Karle PlumberJennifer Tensley, ED Scribe. This patient was seen in room TR08C/TR08C and the patient's care was started at 11:04 PM.  Chief Complaint  Patient presents with  . Dental Pain   Patient is a 43 y.o. female presenting with tooth pain. The history is provided by the patient. No language interpreter was used.  Dental Pain Location:  Upper and lower Quality:  Sharp Severity:  Severe Onset quality:  Sudden Duration:  2 weeks Timing:  Constant Progression:  Worsening Chronicity:  New Context: dental caries, poor dentition and recent dental surgery   Associated symptoms: no difficulty swallowing, no facial pain, no facial swelling, no fever, no gum swelling and no trismus   Risk factors: lack of dental care    HPI Comments:  Carrie [name] is a 43 y.o. female with PMH of chronic back pain and hyperlipidemia who presents to the Emergency Department complaining of worsening severe lower left and upper right dental pain that started approximately two weeks ago secondary to having five teeth extracted by an oral surgeon. She states he "split the gum" and it will not heal properly. She reports finishing a course of antibiotics yesterday that were prescribed by the surgeon. Pt has contacted the oral surgeon and can be seen in three days. Oral surgery was performed at [facility]. She denies fever, chills or facial swelling.  Past Medical History  Diagnosis Date  . Chronic back pain   . Hypercholesteremia    Past Surgical History  Procedure Laterality Date  . Breast lumpectomy    . Dilation and curettage of uterus    . Cesarean section     Family History  Problem Relation Age of Onset  . Cancer Mother     breast cancer   . Multiple sclerosis Mother   . Thyroid disease Mother   . Cancer Maternal Grandmother     History  Substance Use Topics  . Smoking status: Current Every Day Smoker  . Smokeless tobacco: Not on file  . Alcohol Use: No     Comment: OCCASIONAL   OB History   Grav Para Term Preterm Abortions TAB SAB Ect Mult Living                 Review of Systems  Constitutional: Negative for fever and chills.  HENT: Positive for dental problem. Negative for facial swelling.   All other systems reviewed and are negative.   Allergies  Review of patient's allergies indicates no known allergies.  Home Medications   Prior to Admission medications   Medication Sig Start Date End Date Taking? Authorizing Provider  busPIRone (BUSPAR) 15 MG tablet Take 15 mg by mouth See admin instructions. Take 1 tablet in the morning, and 1 additional tablet daily as needed for anxiety.   Yes Historical Provider, MD  doxepin (SINEQUAN) 25 MG capsule Take 25 mg by mouth daily.   Yes Historical Provider, MD  FLUoxetine (PROZAC) 40 MG capsule Take 40 mg by mouth daily.   Yes Historical Provider, MD  ibuprofen (ADVIL,MOTRIN) 200 MG tablet Take 200 mg by mouth every 6 (six) hours as needed for moderate pain.   Yes Historical Provider, MD  lamoTRIgine (LAMICTAL) 25 MG tablet Take 25 mg by mouth 2 (two) times daily.   Yes Historical Provider, MD  oxyCODONE-acetaminophen (PERCOCET) 10-325 MG  per tablet Take 1 tablet by mouth every 4 (four) hours as needed for pain. 07/04/14  Yes Toy Cookey, MD  penicillin v potassium (VEETID) 500 MG tablet Take 1 tablet (500 mg total) by mouth 3 (three) times daily. 06/10/14   Fayrene Helper, PA-C   Triage Vitals: BP 148/80  Pulse 75  Temp(Src) 98.5 F (36.9 C) (Oral)  Resp 24  SpO2 97%  LMP 07/10/2014 Physical Exam  Nursing note and vitals reviewed. Constitutional: She is oriented to person, place, and time. She appears well-developed and well-nourished.  HENT:  Head: Normocephalic and atraumatic.  Mouth/Throat: No trismus in the jaw. Dental caries present.    Widespread  dental decay.  Eyes: EOM are normal.  Neck: Normal range of motion.  Cardiovascular: Normal rate.   Pulmonary/Chest: Effort normal.  Musculoskeletal: Normal range of motion.  Neurological: She is alert and oriented to person, place, and time.  Skin: Skin is warm and dry.  Psychiatric: She has a normal mood and affect. Her behavior is normal.    ED Course  Procedures (including critical care time) DIAGNOSTIC STUDIES: Oxygen Saturation is RA% on RA, normal by my interpretation.   COORDINATION OF CARE: 11:10 PM- Will prescribe pain medication and order Dilaudid injection prior to discharge. Pt verbalizes understanding and agrees to plan.  Medications  HYDROmorphone (DILAUDID) injection 2 mg (not administered)    Labs Review Labs Reviewed - No data to display  Imaging Review No results found.   EKG Interpretation None     Patient is scheduled to follow-up with her oral surgeon on Monday at 0800.  Has exhausted current prescription for pain control.  Asking for help with pain until she can follow-up on Monday. MDM   Final diagnoses:  Pain, dental      I personally performed the services described in this documentation, which was scribed in my presence. The recorded information has been reviewed and is accurate.    Jimmye Norman, NP 07/11/14 [phone]

## 2014-07-13 NOTE — ED Provider Notes (Signed)
Medical screening examination/treatment/procedure(s) were performed by non-physician practitioner and as supervising physician I was immediately available for consultation/collaboration.   EKG Interpretation None       Dolorez Jeffrey M Keaton Beichner, MD 07/13/14 1706 

## 2014-11-28 ENCOUNTER — Emergency Department (HOSPITAL_COMMUNITY): Payer: Medicaid Other

## 2014-11-28 ENCOUNTER — Encounter (HOSPITAL_COMMUNITY): Payer: Self-pay | Admitting: Family Medicine

## 2014-11-28 ENCOUNTER — Emergency Department (HOSPITAL_COMMUNITY)
Admission: EM | Admit: 2014-11-28 | Discharge: 2014-11-28 | Disposition: A | Discharge status: Home / Self Care | Payer: Medicaid Other | Attending: Emergency Medicine | Admitting: Emergency Medicine

## 2014-11-28 DIAGNOSIS — R51 Headache: Secondary | ICD-10-CM

## 2014-11-28 DIAGNOSIS — R519 Headache, unspecified: Secondary | ICD-10-CM

## 2014-11-28 DIAGNOSIS — G8929 Other chronic pain: Secondary | ICD-10-CM | POA: Insufficient documentation

## 2014-11-28 DIAGNOSIS — Z720 Tobacco use: Secondary | ICD-10-CM | POA: Diagnosis not present

## 2014-11-28 DIAGNOSIS — Z792 Long term (current) use of antibiotics: Secondary | ICD-10-CM | POA: Insufficient documentation

## 2014-11-28 DIAGNOSIS — Z8639 Personal history of other endocrine, nutritional and metabolic disease: Secondary | ICD-10-CM | POA: Diagnosis not present

## 2014-11-28 DIAGNOSIS — Z79899 Other long term (current) drug therapy: Secondary | ICD-10-CM | POA: Diagnosis not present

## 2014-11-28 DIAGNOSIS — G43909 Migraine, unspecified, not intractable, without status migrainosus: Secondary | ICD-10-CM | POA: Diagnosis present

## 2014-11-28 MED ORDER — DIPHENHYDRAMINE HCL 50 MG/ML IJ SOLN
25.0000 mg | Freq: Once | INTRAMUSCULAR | Status: AC
  Administered 2014-11-28: 25 mg via INTRAVENOUS
  Filled 2014-11-28: qty 1

## 2014-11-28 MED ORDER — DEXAMETHASONE SODIUM PHOSPHATE 10 MG/ML IJ SOLN
10.0000 mg | Freq: Once | INTRAMUSCULAR | Status: AC
  Administered 2014-11-28: 10 mg via INTRAVENOUS
  Filled 2014-11-28: qty 1

## 2014-11-28 MED ORDER — KETOROLAC TROMETHAMINE 30 MG/ML IJ SOLN
30.0000 mg | Freq: Once | INTRAMUSCULAR | Status: AC
  Administered 2014-11-28: 30 mg via INTRAVENOUS
  Filled 2014-11-28: qty 1

## 2014-11-28 MED ORDER — METOCLOPRAMIDE HCL 5 MG/ML IJ SOLN
10.0000 mg | Freq: Once | INTRAMUSCULAR | Status: AC
  Administered 2014-11-28: 10 mg via INTRAVENOUS
  Filled 2014-11-28: qty 2

## 2014-11-28 MED ORDER — MAGNESIUM SULFATE IN D5W 10-5 MG/ML-% IV SOLN
1.0000 g | Freq: Once | INTRAVENOUS | Status: AC
  Administered 2014-11-28: 1 g via INTRAVENOUS
  Filled 2014-11-28: qty 100

## 2014-11-28 MED ORDER — SODIUM CHLORIDE 0.9 % IV BOLUS (SEPSIS)
1000.0000 mL | Freq: Once | INTRAVENOUS | Status: AC
  Administered 2014-11-28: 1000 mL via INTRAVENOUS
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
## 2014-11-28 NOTE — Discharge Instructions (Signed)
Migraine Headache °A migraine headache is an intense, throbbing pain on one or both sides of your head. A migraine can last for 30 minutes to several hours. °CAUSES  °The exact cause of a migraine headache is not always known. However, a migraine may be caused when nerves in the brain become irritated and release chemicals that cause inflammation. This causes pain. °Certain things may also trigger migraines, such as: °· Alcohol. °· Smoking. °· Stress. °· Menstruation. °· Aged cheeses. °· Foods or drinks that contain nitrates, glutamate, aspartame, or tyramine. °· Lack of sleep. °· Chocolate. °· Caffeine. °· Hunger. °· Physical exertion. °· Fatigue. °· Medicines used to treat chest pain (nitroglycerine), birth control pills, estrogen, and some blood pressure medicines. °SIGNS AND SYMPTOMS °· Pain on one or both sides of your head. °· Pulsating or throbbing pain. °· Severe pain that prevents daily activities. °· Pain that is aggravated by any physical activity. °· Nausea, vomiting, or both. °· Dizziness. °· Pain with exposure to bright lights, loud noises, or activity. °· General sensitivity to bright lights, loud noises, or smells. °Before you get a migraine, you may get warning signs that a migraine is coming (aura). An aura may include: °· Seeing flashing lights. °· Seeing bright spots, halos, or zigzag lines. °· Having tunnel vision or blurred vision. °· Having feelings of numbness or tingling. °· Having trouble talking. °· Having muscle weakness. °DIAGNOSIS  °A migraine headache is often diagnosed based on: °· Symptoms. °· Physical exam. °· A CT scan or MRI of your head. These imaging tests cannot diagnose migraines, but they can help rule out other causes of headaches. °TREATMENT °Medicines may be given for pain and nausea. Medicines can also be given to help prevent recurrent migraines.  °HOME CARE INSTRUCTIONS °· Only take over-the-counter or prescription medicines for pain or discomfort as directed by your  health care provider. The use of long-term narcotics is not recommended. °· Lie down in a dark, quiet room when you have a migraine. °· Keep a journal to find out what may trigger your migraine headaches. For example, write down: °¨ What you eat and drink. °¨ How much sleep you get. °¨ Any change to your diet or medicines. °· Limit alcohol consumption. °· Quit smoking if you smoke. °· Get 7-9 hours of sleep, or as recommended by your health care provider. °· Limit stress. °· Keep lights dim if bright lights bother you and make your migraines worse. °SEEK IMMEDIATE MEDICAL CARE IF:  °· Your migraine becomes severe. °· You have a fever. °· You have a stiff neck. °· You have vision loss. °· You have muscular weakness or loss of muscle control. °· You start losing your balance or have trouble walking. °· You feel faint or pass out. °· You have severe symptoms that are different from your first symptoms. °MAKE SURE YOU:  °· Understand these instructions. °· Will watch your condition. °· Will get help right away if you are not doing well or get worse. °Document Released: 09/25/2005 Document Revised: 02/09/2014 Document Reviewed: 06/02/2013 °ExitCare® Patient Information ©2015 [employer]. This information is not intended to replace advice given to you by your health care provider. Make sure you discuss any questions you have with your health care provider. ° ° °Emergency Department Resource Guide °1) Find a Doctor and Pay Out of Pocket °Although you won't have to find out who is covered by your insurance plan, it is a good idea to ask around and get recommendations.   You will then need to call the office and see if the doctor you have chosen will accept you as a new patient and what types of options they offer for patients who are self-pay. Some doctors offer discounts or will set up payment plans for their patients who do not have insurance, but you will need to ask so you aren't surprised when you get to your  appointment. ° °2) Contact Your Local Health Department °Not all health departments have doctors that can see patients for sick visits, but many do, so it is worth a call to see if yours does. If you don't know where your local health department is, you can check in your phone book. The CDC also has a tool to help you locate your state's health department, and many state websites also have listings of all of their local health departments. ° °3) Find a Walk-in Clinic °If your illness is not likely to be very severe or complicated, you may want to try a walk in clinic. These are popping up all over the country in pharmacies, drugstores, and shopping centers. They're usually staffed by nurse practitioners or physician assistants that have been trained to treat common illnesses and complaints. They're usually fairly quick and inexpensive. However, if you have serious medical issues or chronic medical problems, these are probably not your best option. ° °No Primary Care Doctor: °- Call [facility] at  832-8000 - they can help you locate a primary care doctor that  accepts your insurance, provides certain services, etc. °- Physician Referral Service- 1-800-533-3463 ° °Chronic Pain Problems: °Organization         Address  Phone   Notes  °[facility]  (336) 297-2271 Patients need to be referred by their primary care doctor.  ° °Medication Assistance: °Organization         Address  Phone   Notes  °[place] [facility] [address] °[place], [place] [address] (336) 641-8030 --Must be a resident of [place] °-- Must have NO insurance coverage whatsoever (no Medicaid/ Medicare, etc.) °-- The pt. MUST have a primary care doctor that directs their care regularly and follows them in the community °  °[facility]  (866) 331-1348   °[employer]  (888) 892-1162   ° °Agencies that provide inexpensive medical care: °Organization         Address  Phone   Notes  °[facility]  (336) 832-8035   °[facility]    (336) 832-7272   °[facility] [address] °[place], [place] [address] (336) 832-4777   °[facility] [address], °[place] (336) 271-4999   °[facility]    (336) 373-0678   °[facility]    (336) 272-1050   °[facility] ° [address], [place] Phone:  (336) 832-4444, Fax:  (336) 832-4440 Hours of Operation:  9 am - 6 pm, M-F.  Also accepts Medicaid/Medicare and self-pay.  °[facility] ° [address], [place] Phone: (336) [phone], Fax: (336) [phone]. Hours of Operation:  8:30 am - 5:30 pm, M-F.  Also accepts Medicaid and self-pay.  °[facility] [place] [address], [place] Phone: (336) 878-6027   °[facility] [address], [place], [place] (336)723-1848, Ext. 123 Mondays & Thursdays: 7-9 AM.  First 15 patients are seen on a first come,   first serve basis. °  ° °Medicaid-accepting [place] Providers: ° °Organization         Address  Phone   Notes  °[facility] [address], [place] (336) 641-2100 Also accepts self-pay patients.  °[facility] [address], [place] ° (336) 856-9996   °[facility] [address], [place] (336) 288-8857   °[facility] [address] [place] [address], [place] (336) 299-7000   °Veita Bland [address], [place]  ° (336) 373-1557 Only accepts [place] Access Medicaid patients after they have their name applied to their card.  ° °Self-Pay (no insurance) in [place]: ° °Organization         Address  Phone   Notes  °Sickle Cell Patients, [facility] [address], [place] (336) 832-1970   °[facility] [address], [place] (336) 832-4400   °[facility]  [place] ° [address], [place] (336) 992-4800   °[facility]/Dr. Osei-Bonsu ° [address] [place] [address], [place] or [address], [place] (336) [phone] Phone number for both [place] and [place] locations is the same.  °[facility] [address], [place] (336) 299-0000   °[facility] [address] [place] [address], [place] or [address] (336) 852-7530 °(336) 878-2260   °[facility] [address], [place] (336) 350-1642, phone; (336) 294-5005, fax Sees patients 1st and 3rd Saturday of every month.  Must not qualify for public or private insurance (i.e. Medicaid, Medicare, [place] Health Choice, Veterans' Benefits) • Household income should be no more than 200% of the poverty level •The clinic cannot treat you if you are pregnant or think you are pregnant • Sexually transmitted diseases are not treated at the clinic.  ° ° °Dental Care: °Organization         Address  Phone  Notes  °[facility] [address], [place] (336) 641-6152 Accepts children up to age 21 who are enrolled in Medicaid or [place] Health Choice; pregnant women with a Medicaid card; and children who have applied for Medicaid or [place] Health Choice, but were declined, whose parents can pay a reduced fee at time of service.  °[facility] [place]  [address], [place] (336) 641-7733 Accepts children up to age 21 who are enrolled in Medicaid or [place] Health Choice; pregnant women with a Medicaid card; and children who have applied for Medicaid or  Health Choice, but were declined, whose parents can pay a reduced fee at time of service.  °[facility] ° [address], [place] (336) 641-4533 Patients are seen by appointment only. Walk-ins are not accepted. [facility] will see patients 18 years of age and  older. °Monday - Tuesday (8am-5pm) °Most Wednesdays (8:30-5pm) °$30 per visit, cash only  °[facility] ° [address], [place] (336) 641-4533 Patients are seen by appointment only. Walk-ins are not accepted. [facility] will see patients 18 years of age and older. °One Wednesday Evening (Monthly: Volunteer Based).  $30 per visit, cash only  °[facility]  (919) 537-3737 for adults; Children under age 4, call Graduate Pediatric Dentistry at (919) [phone]. Children aged 4-14, please   call (919) [phone] to request a pediatric application. ° Dental services are provided in all areas of dental care including fillings, crowns and bridges, complete and partial dentures, implants, gum treatment, root canals, and extractions. Preventive care is also provided. Treatment is provided to both adults and children. °Patients are selected via a lottery and there is often a waiting list. °  °[facility] [address], °[place] ° (336) 763-8833 [email or web address] °  °[facility] [address], [place], [place] (336)723-1848, Ext. 123 Second and Fourth Thursday of each month, opens at 6:30 AM; Clinic ends at 9 AM.  Patients are seen on a first-come first-served basis, and a limited number are seen during each clinic.  ° °[facility] ° [address], [place], [place] (336) 723-7904   Eligibility Requirements °You must have lived in [place], [place], or [place] counties for at least the last three months. °  You cannot be eligible for state or federal sponsored healthcare insurance, including Veterans Administration, Medicaid, or Medicare. °  You generally cannot be eligible for healthcare insurance through your employer.  °  How to apply: °Eligibility screenings are held every Tuesday and Wednesday afternoon from 1:00 pm until 4:00 pm. You do not need an appointment for the interview!  °[facility] [address],  [place], [place] 336-631-2330   °[facility]  336-342-8273   °[facility]  336-703-3100   °[facility]  336-570-6415   ° °Behavioral Health Resources in the Community: °Intensive Outpatient Programs °Organization         Address  Phone  Notes  °[facility] [address], [place], [place] 336-878-6098   °[facility] [address], [place], [place] 336-832-9800   °[facility] [address], [place], [place] ° 336-882-2125   °[facility] [address],  °[place], [place] 1-800-853-5163 or 336-641-4981   °Substance Abuse Resources °Organization         Address  Phone  Notes  °[facility]  336-882-2125   °[facility]  336-784-9470   °[facility]  336-285-9073   °[facility]  336-845-3988   °Residential & Outpatient Substance Abuse Program  1-800-659-3381   °Psychological Services °Organization         Address  Phone  Notes  °[facility]  336- 832-9600   °[facility]  336- 378-7881   °[facility] [address], [place] 1-800-853-5163 or 336-641-4981   ° °Mobile Crisis Teams °Organization         Address  Phone  Notes  °[facility], Mobile Crisis Care Unit  1-877-626-1772   °Assertive °[facility] ° [address] [place], [place] 336-834-9664   °Sharon DeEsch [address] °[place] 336-554-5454   ° °Self-Help/Support Groups °Organization         Address  Phone             Notes  °[facility] [place] - variety of support groups  336- 373-1402 Call for more information  °Narcotics Anonymous (NA), [facility] [address], °[place]  2 meetings at this location  ° °Residential Treatment Programs °Organization         Address  Phone  Notes  °[facility] [address],    ° [place]  1-866-801-8205   °[facility] ° [address], [place], [place] 704-293-8524   °[facility] [address], [place] 336-845-3988 Admissions: 8am-3pm M-F  °[facility] [address]    °[place], [place] 336-841-1104   °[facility] [address], [place], [place] 336-379-7146   °[facility] [address]  °[place], [place] 336-285-9073   °[facility] Intensive Outpatient [address], [place], [place] 336-852-3033   °[facility] [address]  °[place], [place] 1-877-615-2722 or 336-784-9470   °[facility] [address] [place], [place] 336-227-7417 Accepts Medicaid  °[facility] [address]  °[place] 1-800-659-3381 Substance Abuse/Addiction Treatment  ° °[place] Behavioral Health Resources °Organization         Address  Phone  Notes  °[facility]  (888) 581-9988   °Julie Brannon, PhD [address] [place], [place]   (336) 349-5553 or (336) 951-0000   °[facility]   [address] °[place], [place] (336) 349-4454   °[facility] [address], [place], [place] (336) 342-8316 Insurance/Medicaid/sponsorship through [facility]  °[facility] [address]                                    [place], [place] (336) 342-8316 Therapy/tele-psych/case  °[facility] [address]  ° [place], [place] (336) 349-2233    °Dr. Arfeen  (336) 349-4544   °[facility] [place]  [employer] [place] [facility] 1) [address], [place] °2) [address], [place] °3)  [address], [place] (336) 349-3220 °(336) 342-7768 ° °(336) 342-8140   °[place] [facility] (336) 342-1394 or (336) 342-3537 (After Hours)    ° ° ° °

## 2014-11-28 NOTE — ED Notes (Signed)
Patient transported to CT 

## 2014-11-28 NOTE — ED Notes (Signed)
Pt here for migraine since yesterday. sts pain on the left side of her head in 1 spot. Pt N,V.

## 2015-02-24 ENCOUNTER — Ambulatory Visit: Payer: Medicaid Other | Admitting: Internal Medicine

## 2015-09-17 ENCOUNTER — Encounter (HOSPITAL_COMMUNITY): Payer: Self-pay | Admitting: Emergency Medicine

## 2015-09-17 ENCOUNTER — Emergency Department (HOSPITAL_COMMUNITY)
Admission: EM | Admit: 2015-09-17 | Discharge: 2015-09-17 | Disposition: A | Discharge status: Home / Self Care | Payer: Medicaid Other | Attending: Emergency Medicine | Admitting: Emergency Medicine

## 2015-09-17 DIAGNOSIS — Z8639 Personal history of other endocrine, nutritional and metabolic disease: Secondary | ICD-10-CM | POA: Insufficient documentation

## 2015-09-17 DIAGNOSIS — G8929 Other chronic pain: Secondary | ICD-10-CM | POA: Insufficient documentation

## 2015-09-17 DIAGNOSIS — F17200 Nicotine dependence, unspecified, uncomplicated: Secondary | ICD-10-CM | POA: Insufficient documentation

## 2015-09-17 DIAGNOSIS — R519 Headache, unspecified: Secondary | ICD-10-CM

## 2015-09-17 DIAGNOSIS — R112 Nausea with vomiting, unspecified: Secondary | ICD-10-CM | POA: Insufficient documentation

## 2015-09-17 DIAGNOSIS — R1013 Epigastric pain: Secondary | ICD-10-CM | POA: Insufficient documentation

## 2015-09-17 DIAGNOSIS — Z79899 Other long term (current) drug therapy: Secondary | ICD-10-CM | POA: Insufficient documentation

## 2015-09-17 DIAGNOSIS — R51 Headache: Secondary | ICD-10-CM | POA: Insufficient documentation

## 2015-09-17 LAB — COMPREHENSIVE METABOLIC PANEL
ALT: 10 U/L — ABNORMAL LOW (ref 14–54)
AST: 18 U/L (ref 15–41)
Albumin: 4.1 g/dL (ref 3.5–5.0)
Alkaline Phosphatase: 82 U/L (ref 38–126)
Anion gap: 10 (ref 5–15)
BUN: <5 mg/dL — ABNORMAL LOW (ref 6–20)
CALCIUM: 9.5 mg/dL (ref 8.9–10.3)
CHLORIDE: 103 mmol/L (ref 101–111)
CO2: 24 mmol/L (ref 22–32)
Creatinine, Ser: 0.71 mg/dL (ref 0.44–1.00)
Glucose, Bld: 106 mg/dL — ABNORMAL HIGH (ref 65–99)
Potassium: 3.4 mmol/L — ABNORMAL LOW (ref 3.5–5.1)
SODIUM: 137 mmol/L (ref 135–145)
Total Bilirubin: 0.8 mg/dL (ref 0.3–1.2)
Total Protein: 7.6 g/dL (ref 6.5–8.1)

## 2015-09-17 LAB — URINE MICROSCOPIC-ADD ON

## 2015-09-17 LAB — URINALYSIS, ROUTINE W REFLEX MICROSCOPIC
Bilirubin Urine: NEGATIVE
Glucose, UA: NEGATIVE mg/dL
Ketones, ur: 15 mg/dL — AB
LEUKOCYTES UA: NEGATIVE
Nitrite: NEGATIVE
PROTEIN: NEGATIVE mg/dL
SPECIFIC GRAVITY, URINE: 1.008 (ref 1.005–1.030)
pH: 6 (ref 5.0–8.0)

## 2015-09-17 LAB — CBC
HCT: 41.8 % (ref 36.0–46.0)
Hemoglobin: 13.3 g/dL (ref 12.0–15.0)
MCH: 28.5 pg (ref 26.0–34.0)
MCHC: 31.8 g/dL (ref 30.0–36.0)
MCV: 89.5 fL (ref 78.0–100.0)
Platelets: 292 10*3/uL (ref 150–400)
RBC: 4.67 MIL/uL (ref 3.87–5.11)
RDW: 12.4 % (ref 11.5–15.5)
WBC: 6.4 10*3/uL (ref 4.0–10.5)

## 2015-09-17 LAB — LIPASE, BLOOD: LIPASE: 19 U/L (ref 11–51)

## 2015-09-17 MED ORDER — DIPHENHYDRAMINE HCL 50 MG/ML IJ SOLN
25.0000 mg | Freq: Once | INTRAMUSCULAR | Status: AC
  Administered 2015-09-17: 25 mg via INTRAVENOUS
  Filled 2015-09-17: qty 1

## 2015-09-17 MED ORDER — SODIUM CHLORIDE 0.9 % IV SOLN: 1000.0000 mL | INTRAVENOUS | Status: DC

## 2015-09-17 MED ORDER — METOCLOPRAMIDE HCL 5 MG/ML IJ SOLN
10.0000 mg | Freq: Once | INTRAMUSCULAR | Status: AC
  Administered 2015-09-17: 10 mg via INTRAVENOUS
  Filled 2015-09-17: qty 2

## 2015-09-17 MED ORDER — SODIUM CHLORIDE 0.9 % IV SOLN
1000.0000 mL | Freq: Once | INTRAVENOUS | Status: AC
  Administered 2015-09-17: 1000 mL via INTRAVENOUS

## 2015-09-17 MED ORDER — ONDANSETRON HCL 4 MG/2ML IJ SOLN
4.0000 mg | Freq: Once | INTRAMUSCULAR | Status: DC | PRN
  Filled 2015-09-17: qty 2

## 2015-09-17 MED ORDER — ONDANSETRON 4 MG PO TBDP: 4.0000 mg | ORAL_TABLET | ORAL | Status: DC | PRN

## 2015-09-17 MED ORDER — FAMOTIDINE IN NACL 20-0.9 MG/50ML-% IV SOLN
20.0000 mg | Freq: Once | INTRAVENOUS | Status: AC
Start: 2015-09-17 — End: 2015-09-17
  Administered 2015-09-17: 20 mg via INTRAVENOUS
  Filled 2015-09-17: qty 50

## 2015-09-17 MED ORDER — KETOROLAC TROMETHAMINE 30 MG/ML IJ SOLN
30.0000 mg | Freq: Once | INTRAMUSCULAR | Status: AC
  Administered 2015-09-17: 30 mg via INTRAVENOUS
  Filled 2015-09-17: qty 1

## 2015-09-17 MED ORDER — IBUPROFEN 600 MG PO TABS: 600.0000 mg | ORAL_TABLET | Freq: Four times a day (QID) | ORAL | Status: DC | PRN

## 2015-09-17 MED ORDER — FAMOTIDINE 20 MG PO TABS: 20.0000 mg | ORAL_TABLET | Freq: Two times a day (BID) | ORAL | Status: DC

## 2015-09-17 NOTE — ED Provider Notes (Signed)
CSN: [id number]     Arrival date & time 09/17/15  1037 History   First MD Initiated Contact with Patient 09/17/15 1038     Chief Complaint  Patient presents with  . Emesis  . Abdominal Pain     (Consider location/radiation/quality/duration/timing/severity/associated sxs/prior Treatment) HPI Patient reports that she started having vomiting 4 days ago. She states she throwing up every day. No diarrhea no fever. Patient denies she has significant abdominal pain. She does say that her epigastrium burns which she believes is due to so much vomiting. She reports she is also developed a headache. She reports she has a history of cluster headaches and thinks this headache is developed from getting dehydrated. She reports typically her headache hurts on the left side of her head. She reports this headache has become more generalized in nature. No weakness numbness tingling or confusion. No gait instability. Past Medical History  Diagnosis Date  . Chronic back pain   . Hypercholesteremia    Past Surgical History  Procedure Laterality Date  . Breast lumpectomy    . Dilation and curettage of uterus    . Cesarean section     Family History  Problem Relation Age of Onset  . Cancer Mother     breast cancer   . Multiple sclerosis Mother   . Thyroid disease Mother   . Cancer Maternal Grandmother    Social History  Substance Use Topics  . Smoking status: Current Every Day Smoker  . Smokeless tobacco: None  . Alcohol Use: No     Comment: OCCASIONAL   OB History    No data available     Review of Systems  10 Systems reviewed and are negative for acute change except as noted in the HPI.   Allergies  Review of patient's allergies indicates no known allergies.  Home Medications   Prior to Admission medications   Medication Sig Start Date End Date Taking? Authorizing Provider  aspirin-acetaminophen-caffeine (EXCEDRIN MIGRAINE) [phone] MG tablet Take 2 tablets by mouth every 6 (six)  hours as needed for headache.   Yes Historical Provider, MD  famotidine (PEPCID) 20 MG tablet Take 1 tablet (20 mg total) by mouth 2 (two) times daily. 09/17/15   Arby Barrette, MD  ibuprofen (ADVIL,MOTRIN) 600 MG tablet Take 1 tablet (600 mg total) by mouth every 6 (six) hours as needed. 09/17/15   Arby Barrette, MD  ondansetron (ZOFRAN ODT) 4 MG disintegrating tablet Take 1 tablet (4 mg total) by mouth every 4 (four) hours as needed for nausea or vomiting. 09/17/15   Arby Barrette, MD   BP 111/66 mmHg  Pulse 77  Temp(Src) 98.6 F (37 C) (Oral)  Resp 16  Ht  (1.727 m)  Wt 178 lb (80.74 kg)  BMI 27.07 kg/m2  SpO2 99% Physical Exam  Constitutional: She is oriented to person, place, and time. She appears well-developed and well-nourished.  HENT:  Head: Normocephalic and atraumatic.  Mouth/Throat: Oropharynx is clear and moist.  Eyes: EOM are normal. Pupils are equal, round, and reactive to light.  Neck: Neck supple.  Cardiovascular: Normal rate, regular rhythm, normal heart sounds and intact distal pulses.   Pulmonary/Chest: Effort normal and breath sounds normal.  Abdominal: Soft. Bowel sounds are normal. She exhibits no distension. There is no tenderness.  Musculoskeletal: Normal range of motion. She exhibits no edema.  Neurological: She is alert and oriented to person, place, and time. She has normal strength. No cranial nerve deficit. She exhibits normal muscle tone.  Coordination normal. GCS eye subscore is 4. GCS verbal subscore is 5. GCS motor subscore is 6.  Skin: Skin is warm, dry and intact.  Psychiatric: She has a normal mood and affect.    ED Course  Procedures (including critical care time) Labs Review Labs Reviewed  COMPREHENSIVE METABOLIC PANEL - Abnormal; Notable for the following:    Potassium 3.4 (*)    Glucose, Bld 106 (*)    BUN <5 (*)    ALT 10 (*)    All other components within normal limits  URINALYSIS, ROUTINE W REFLEX MICROSCOPIC (NOT AT [facility]) -  Abnormal; Notable for the following:    Hgb urine dipstick MODERATE (*)    Ketones, ur 15 (*)    All other components within normal limits  URINE MICROSCOPIC-ADD ON - Abnormal; Notable for the following:    Squamous Epithelial / LPF 6-30 (*)    Bacteria, UA RARE (*)    All other components within normal limits  LIPASE, BLOOD  CBC    Imaging Review No results found. I have personally reviewed and evaluated these images and lab results as part of my medical decision-making.   EKG Interpretation None     Recheck 14:15 patient's improved. She's had no further vomiting. Nausea is improved, patient has been able to rest comfortably. MDM   Final diagnoses:  Non-intractable vomiting with nausea, vomiting of unspecified type  Acute nonintractable headache, unspecified headache type   Patient improved significantly with symptomatically treatment. At this point time this appears most likely gastritis with dehydration and headache. Patient discharged in good condition. Return instructions are provided.   Arby BarretteMarcy Lashunta Frieden, MD 09/20/15 [phone]

## 2015-09-17 NOTE — Discharge Instructions (Signed)
General Headache Without Cause A headache is pain or discomfort felt around the head or neck area. The specific cause of a headache may not be found. There are many causes and types of headaches. A few common ones are:  Tension headaches.  Migraine headaches.  Cluster headaches.  Chronic daily headaches. HOME CARE INSTRUCTIONS  Watch your condition for any changes. Take these steps to help with your condition: Managing Pain  Take over-the-counter and prescription medicines only as told by your health care provider.  Lie down in a dark, quiet room when you have a headache.  If directed, apply ice to the head and neck area:  Put ice in a plastic bag.  Place a towel between your skin and the bag.  Leave the ice on for 20 minutes, 2-3 times per day.  Use a heating pad or hot shower to apply heat to the head and neck area as told by your health care provider.  Keep lights dim if bright lights bother you or make your headaches worse. Eating and Drinking  Eat meals on a regular schedule.  Limit alcohol use.  Decrease the amount of caffeine you drink, or stop drinking caffeine. General Instructions  Keep all follow-up visits as told by your health care provider. This is important.  Keep a headache journal to help find out what may trigger your headaches. For example, write down:  What you eat and drink.  How much sleep you get.  Any change to your diet or medicines.  Try massage or other relaxation techniques.  Limit stress.  Sit up straight, and do not tense your muscles.  Do not use tobacco products, including cigarettes, chewing tobacco, or e-cigarettes. If you need help quitting, ask your health care provider.  Exercise regularly as told by your health care provider.  Sleep on a regular schedule. Get 7-9 hours of sleep, or the amount recommended by your health care provider. SEEK MEDICAL CARE IF:   Your symptoms are not helped by medicine.  You have a  headache that is different from the usual headache.  You have nausea or you vomit.  You have a fever. SEEK IMMEDIATE MEDICAL CARE IF:   Your headache becomes severe.  You have repeated vomiting.  You have a stiff neck.  You have a loss of vision.  You have problems with speech.  You have pain in the eye or ear.  You have muscular weakness or loss of muscle control.  You lose your balance or have trouble walking.  You feel faint or pass out.  You have confusion.   This information is not intended to replace advice given to you by your health care provider. Make sure you discuss any questions you have with your health care provider.   Document Released: 09/25/2005 Document Revised: 06/16/2015 Document Reviewed: 01/18/2015 Elsevier Interactive Patient Education 2016 Else[employer] Inc. Nausea and Vomiting Nausea is a sick feeling that often comes before throwing up (vomiting). Vomiting is a reflex where stomach contents come out of your mouth. Vomiting can cause severe loss of body fluids (dehydration). Children and elderly adults can become dehydrated quickly, especially if they also have diarrhea. Nausea and vomiting are symptoms of a condition or disease. It is important to find the cause of your symptoms. CAUSES   Direct irritation of the stomach lining. This irritation can result from increased acid production (gastroesophageal reflux disease), infection, food poisoning, taking certain medicines (such as nonsteroidal anti-inflammatory drugs), alcohol use, or tobacco use.  Signals  from the brain.These signals could be caused by a headache, heat exposure, an inner ear disturbance, increased pressure in the brain from injury, infection, a tumor, or a concussion, pain, emotional stimulus, or metabolic problems.  An obstruction in the gastrointestinal tract (bowel obstruction).  Illnesses such as diabetes, hepatitis, gallbladder problems, appendicitis, kidney problems, cancer,  sepsis, atypical symptoms of a heart attack, or eating disorders.  Medical treatments such as chemotherapy and radiation.  Receiving medicine that makes you sleep (general anesthetic) during surgery. DIAGNOSIS Your caregiver may ask for tests to be done if the problems do not improve after a few days. Tests may also be done if symptoms are severe or if the reason for the nausea and vomiting is not clear. Tests may include:  Urine tests.  Blood tests.  Stool tests.  Cultures (to look for evidence of infection).  X-rays or other imaging studies. Test results can help your caregiver make decisions about treatment or the need for additional tests. TREATMENT You need to stay well hydrated. Drink frequently but in small amounts.You may wish to drink water, sports drinks, clear broth, or eat frozen ice pops or gelatin dessert to help stay hydrated.When you eat, eating slowly may help prevent nausea.There are also some antinausea medicines that may help prevent nausea. HOME CARE INSTRUCTIONS   Take all medicine as directed by your caregiver.  If you do not have an appetite, do not force yourself to eat. However, you must continue to drink fluids.  If you have an appetite, eat a normal diet unless your caregiver tells you differently.  Eat a variety of complex carbohydrates (rice, wheat, potatoes, bread), lean meats, yogurt, fruits, and vegetables.  Avoid high-fat foods because they are more difficult to digest.  Drink enough water and fluids to keep your urine clear or pale yellow.  If you are dehydrated, ask your caregiver for specific rehydration instructions. Signs of dehydration may include:  Severe thirst.  Dry lips and mouth.  Dizziness.  Dark urine.  Decreasing urine frequency and amount.  Confusion.  Rapid breathing or pulse. SEEK IMMEDIATE MEDICAL CARE IF:   You have blood or brown flecks (like coffee grounds) in your vomit.  You have black or bloody  stools.  You have a severe headache or stiff neck.  You are confused.  You have severe abdominal pain.  You have chest pain or trouble breathing.  You do not urinate at least once every 8 hours.  You develop cold or clammy skin.  You continue to vomit for longer than 24 to 48 hours.  You have a fever. MAKE SURE YOU:   Understand these instructions.  Will watch your condition.  Will get help right away if you are not doing well or get worse.   This information is not intended to replace advice given to you by your health care provider. Make sure you discuss any questions you have with your health care provider.   Document Released: 09/25/2005 Document Revised: 12/18/2011 Document Reviewed: 02/22/2011 Elsevier Interactive Patient Education [employer]2016 Elsevier Inc.

## 2015-09-17 NOTE — ED Notes (Signed)
C/o vomiting since Tuesday, no fever or diarrhea, reports decreased UO and HA

## 2016-01-18 ENCOUNTER — Emergency Department (HOSPITAL_COMMUNITY): Payer: Medicaid Other

## 2016-01-18 ENCOUNTER — Encounter (HOSPITAL_COMMUNITY): Payer: Self-pay | Admitting: Emergency Medicine

## 2016-01-18 DIAGNOSIS — Z8639 Personal history of other endocrine, nutritional and metabolic disease: Secondary | ICD-10-CM | POA: Insufficient documentation

## 2016-01-18 DIAGNOSIS — Y998 Other external cause status: Secondary | ICD-10-CM | POA: Insufficient documentation

## 2016-01-18 DIAGNOSIS — X58XXXA Exposure to other specified factors, initial encounter: Secondary | ICD-10-CM | POA: Insufficient documentation

## 2016-01-18 DIAGNOSIS — Z79899 Other long term (current) drug therapy: Secondary | ICD-10-CM | POA: Insufficient documentation

## 2016-01-18 DIAGNOSIS — Y9289 Other specified places as the place of occurrence of the external cause: Secondary | ICD-10-CM | POA: Insufficient documentation

## 2016-01-18 DIAGNOSIS — Y9389 Activity, other specified: Secondary | ICD-10-CM | POA: Insufficient documentation

## 2016-01-18 DIAGNOSIS — Z7982 Long term (current) use of aspirin: Secondary | ICD-10-CM | POA: Insufficient documentation

## 2016-01-18 DIAGNOSIS — S8992XA Unspecified injury of left lower leg, initial encounter: Secondary | ICD-10-CM | POA: Insufficient documentation

## 2016-01-18 DIAGNOSIS — G8929 Other chronic pain: Secondary | ICD-10-CM | POA: Insufficient documentation

## 2016-01-18 DIAGNOSIS — F17200 Nicotine dependence, unspecified, uncomplicated: Secondary | ICD-10-CM | POA: Insufficient documentation

## 2016-01-18 NOTE — ED Notes (Signed)
Pt. reports pain at left knee onset today " it popped" , denies fall /ambulatory .

## 2016-01-19 ENCOUNTER — Emergency Department (HOSPITAL_COMMUNITY)
Admission: EM | Admit: 2016-01-19 | Discharge: 2016-01-19 | Disposition: A | Discharge status: Home / Self Care | Payer: Medicaid Other | Attending: Emergency Medicine | Admitting: Emergency Medicine

## 2016-01-19 DIAGNOSIS — M25562 Pain in left knee: Secondary | ICD-10-CM

## 2016-01-19 MED ORDER — HYDROCODONE-ACETAMINOPHEN 5-325 MG PO TABS: 1.0000 | ORAL_TABLET | Freq: Four times a day (QID) | ORAL | Status: DC | PRN

## 2016-01-19 NOTE — Discharge Instructions (Signed)

## 2016-01-19 NOTE — ED Notes (Signed)
Ortho at bedside applying immobilizer and crutches education

## 2016-01-19 NOTE — ED Provider Notes (Signed)
CSN: [id number]     Arrival date & time 01/18/16  2323 History   First MD Initiated Contact with Patient 01/19/16 0014     Chief Complaint  Patient presents with  . Knee Pain     (Consider location/radiation/quality/duration/timing/severity/associated sxs/prior Treatment) HPI Comments: Patient presents emergency department with chief complaint of left knee pain. She states that she stood up today, and her left knee popped. She states that now feels unstable. She states it is painful to walk on. She has not tried taking anything for the pain. It is worsened with palpation and ambulation. She denies any other associated symptoms. Denies any radiating pain.  The history is provided by the patient. No language interpreter was used.    Past Medical History  Diagnosis Date  . Chronic back pain   . Hypercholesteremia    Past Surgical History  Procedure Laterality Date  . Breast lumpectomy    . Dilation and curettage of uterus    . Cesarean section     Family History  Problem Relation Age of Onset  . Cancer Mother     breast cancer   . Multiple sclerosis Mother   . Thyroid disease Mother   . Cancer Maternal Grandmother    Social History  Substance Use Topics  . Smoking status: Current Every Day Smoker  . Smokeless tobacco: None  . Alcohol Use: No     Comment: OCCASIONAL   OB History    No data available     Review of Systems  Constitutional: Negative for fever and chills.  Respiratory: Negative for shortness of breath.   Cardiovascular: Negative for chest pain.  Gastrointestinal: Negative for nausea, vomiting, diarrhea and constipation.  Genitourinary: Negative for dysuria.  Musculoskeletal: Positive for arthralgias.  All other systems reviewed and are negative.     Allergies  Review of patient's allergies indicates no known allergies.  Home Medications   Prior to Admission medications   Medication Sig Start Date End Date Taking? Authorizing Provider   aspirin-acetaminophen-caffeine (EXCEDRIN MIGRAINE) [phone] MG tablet Take 2 tablets by mouth every 6 (six) hours as needed for headache.    Historical Provider, MD  famotidine (PEPCID) 20 MG tablet Take 1 tablet (20 mg total) by mouth 2 (two) times daily. 09/17/15   Arby BarretteMarcy Pfeiffer, MD  HYDROcodone-acetaminophen (NORCO/VICODIN) 5-325 MG tablet Take 1-2 tablets by mouth every 6 (six) hours as needed. 01/19/16   Roxy Horsemanobert Akirah Storck, PA-C  ibuprofen (ADVIL,MOTRIN) 600 MG tablet Take 1 tablet (600 mg total) by mouth every 6 (six) hours as needed. 09/17/15   Arby BarretteMarcy Pfeiffer, MD  ondansetron (ZOFRAN ODT) 4 MG disintegrating tablet Take 1 tablet (4 mg total) by mouth every 4 (four) hours as needed for nausea or vomiting. 09/17/15   Arby BarretteMarcy Pfeiffer, MD   BP 102/64 mmHg  Pulse 70  Temp(Src) 98 F (36.7 C) (Oral)  Resp 18  Ht 5\' 9"  (1.753 m)  Wt 72.576 kg  BMI 23.62 kg/m2  SpO2 96%  LMP 01/15/2016 Physical Exam  Constitutional: She is oriented to person, place, and time. She appears well-developed and well-nourished.  HENT:  Head: Normocephalic and atraumatic.  Eyes: Conjunctivae and EOM are normal. Pupils are equal, round, and reactive to light.  Neck: Normal range of motion. Neck supple.  Cardiovascular: Normal rate and regular rhythm.  Exam reveals no gallop and no friction rub.   No murmur heard. Pulmonary/Chest: Effort normal and breath sounds normal. No respiratory distress. She has no wheezes. She has no rales. She  exhibits no tenderness.  Abdominal: Soft. Bowel sounds are normal. She exhibits no distension and no mass. There is no tenderness. There is no rebound and no guarding.  Musculoskeletal: Normal range of motion. She exhibits no edema or tenderness.  Left knee tender to palpation diffusely, no effusion, no bony abnormality or deformity, joint stability testing limited secondary to pain  Neurological: She is alert and oriented to person, place, and time.  Skin: Skin is warm and dry.   Psychiatric: She has a normal mood and affect. Her behavior is normal. Judgment and thought content normal.  Nursing note and vitals reviewed.   ED Course  Procedures (including critical care time) Labs Review Labs Reviewed - No data to display  Imaging Review Dg Knee Complete 4 Views Left  01/19/2016  CLINICAL DATA:  Felt pop in left knee, with pain and swelling. Initial encounter. EXAM: LEFT KNEE - COMPLETE 4+ VIEW COMPARISON:  None. FINDINGS: There is no evidence of fracture or dislocation. The joint spaces are preserved. No significant degenerative change is seen; the patellofemoral joint is grossly unremarkable in appearance. No significant joint effusion is seen. The visualized soft tissues are normal in appearance. IMPRESSION: No evidence of fracture or dislocation. Electronically Signed   By: Roanna Raider M.D.   On: 01/19/2016 00:18   I have personally reviewed and evaluated these images and lab results as part of my medical decision-making.    MDM   Final diagnoses:  Knee pain, acute, left    Patient with left knee pain. Will discharge to home with orthopedic follow-up. Will give knee immobilizer, crutches, and pain medicine. Recommend rice therapy.    Roxy Horseman, PA-C 01/19/16 1191  Rolan Bucco, MD 01/19/16 [phone]

## 2016-02-03 ENCOUNTER — Encounter (HOSPITAL_COMMUNITY): Payer: Self-pay | Admitting: *Deleted

## 2016-02-03 ENCOUNTER — Emergency Department (HOSPITAL_COMMUNITY)
Admission: EM | Admit: 2016-02-03 | Discharge: 2016-02-03 | Disposition: A | Discharge status: Home / Self Care | Payer: Medicaid Other | Attending: Emergency Medicine | Admitting: Emergency Medicine

## 2016-02-03 DIAGNOSIS — G8929 Other chronic pain: Secondary | ICD-10-CM | POA: Insufficient documentation

## 2016-02-03 DIAGNOSIS — F17200 Nicotine dependence, unspecified, uncomplicated: Secondary | ICD-10-CM | POA: Insufficient documentation

## 2016-02-03 DIAGNOSIS — Z8639 Personal history of other endocrine, nutritional and metabolic disease: Secondary | ICD-10-CM | POA: Insufficient documentation

## 2016-02-03 DIAGNOSIS — G43909 Migraine, unspecified, not intractable, without status migrainosus: Secondary | ICD-10-CM | POA: Insufficient documentation

## 2016-02-03 HISTORY — DX: Migraine, unspecified, not intractable, without status migrainosus: G43.909

## 2016-02-03 LAB — BASIC METABOLIC PANEL
ANION GAP: 8 (ref 5–15)
BUN: 7 mg/dL (ref 6–20)
CALCIUM: 9 mg/dL (ref 8.9–10.3)
CHLORIDE: 110 mmol/L (ref 101–111)
CO2: 23 mmol/L (ref 22–32)
Creatinine, Ser: 0.77 mg/dL (ref 0.44–1.00)
GFR calc Af Amer: >60 mL/min (ref >60)
GLUCOSE: 100 mg/dL — AB (ref 65–99)
POTASSIUM: 3.6 mmol/L (ref 3.5–5.1)
Sodium: 141 mmol/L (ref 135–145)

## 2016-02-03 LAB — CBC
HEMATOCRIT: 36.4 % (ref 36.0–46.0)
HEMOGLOBIN: 11.5 g/dL — AB (ref 12.0–15.0)
MCH: 29.6 pg (ref 26.0–34.0)
MCHC: 31.6 g/dL (ref 30.0–36.0)
MCV: 93.8 fL (ref 78.0–100.0)
Platelets: 246 10*3/uL (ref 150–400)
RBC: 3.88 MIL/uL (ref 3.87–5.11)
RDW: 12.6 % (ref 11.5–15.5)
WBC: 5.8 10*3/uL (ref 4.0–10.5)

## 2016-02-03 MED ORDER — SODIUM CHLORIDE 0.9 % IV BOLUS (SEPSIS)
1000.0000 mL | Freq: Once | INTRAVENOUS | Status: AC
  Administered 2016-02-03: 1000 mL via INTRAVENOUS

## 2016-02-03 MED ORDER — ONDANSETRON HCL 4 MG/2ML IJ SOLN
4.0000 mg | Freq: Once | INTRAMUSCULAR | Status: AC
  Administered 2016-02-03: 4 mg via INTRAVENOUS
  Filled 2016-02-03: qty 2

## 2016-02-03 MED ORDER — KETOROLAC TROMETHAMINE 30 MG/ML IJ SOLN
30.0000 mg | Freq: Once | INTRAMUSCULAR | Status: AC
  Administered 2016-02-03: 30 mg via INTRAVENOUS
  Filled 2016-02-03: qty 1

## 2016-02-03 MED ORDER — PROCHLORPERAZINE EDISYLATE 5 MG/ML IJ SOLN
10.0000 mg | Freq: Once | INTRAMUSCULAR | Status: AC
  Administered 2016-02-03: 10 mg via INTRAVENOUS
  Filled 2016-02-03: qty 2

## 2016-02-03 MED ORDER — BUTALBITAL-APAP-CAFFEINE 50-325-40 MG PO TABS: 1.0000 | ORAL_TABLET | Freq: Four times a day (QID) | ORAL | Status: DC | PRN

## 2016-02-03 MED ORDER — BUTALBITAL-APAP-CAFFEINE 50-325-40 MG PO TABS
2.0000 | ORAL_TABLET | Freq: Once | ORAL | Status: AC
  Administered 2016-02-03: 2 via ORAL
  Filled 2016-02-03: qty 2

## 2016-02-03 MED ORDER — ONDANSETRON 4 MG PO TBDP
4.0000 mg | ORAL_TABLET | Freq: Once | ORAL | Status: AC
  Administered 2016-02-03: 4 mg via ORAL

## 2016-02-03 MED ORDER — ONDANSETRON 4 MG PO TBDP: 4.0000 mg | ORAL_TABLET | Freq: Three times a day (TID) | ORAL | Status: DC | PRN

## 2016-02-03 MED ORDER — SUMATRIPTAN SUCCINATE 50 MG PO TABS: 50.0000 mg | ORAL_TABLET | ORAL | Status: DC | PRN

## 2016-02-03 MED ORDER — DIPHENHYDRAMINE HCL 50 MG/ML IJ SOLN
25.0000 mg | Freq: Once | INTRAMUSCULAR | Status: AC
  Administered 2016-02-03: 25 mg via INTRAVENOUS
  Filled 2016-02-03: qty 1

## 2016-02-03 MED ORDER — ONDANSETRON 4 MG PO TBDP
ORAL_TABLET | ORAL | Status: AC
  Filled 2016-02-03: qty 1

## 2016-02-03 NOTE — ED Notes (Signed)
Pt reports migraine that started last night. Pt reports hx of same. Pt has nausea as well. Pt denies taking any otc medications for pain.

## 2016-02-03 NOTE — Discharge Instructions (Signed)
You have been seen today for a migraine. Your lab tests showed no abnormalities. Follow up with neurology on this matter. The neurologist listed is currently accepting new patients, including Medicaid patients. Call the office to set up an appointment. Follow up with PCP as needed. Return to ED should symptoms worsen. You have been prescribed 2 migraine medications today. Use NSAID such as ibuprofen or naproxen first, should this fail to resolve the headache you may try the Fioricet. Only if both of these fail should you try the Imitrex. Do not take the Fioricet and Imitrex at the same time.

## 2016-02-03 NOTE — ED Notes (Signed)
Pt ambulates independently and with steady gait at time of discharge. Discharge instructions and follow up information reviewed with patient. No other questions or concerns voiced at this time.  

## 2016-02-03 NOTE — ED Provider Notes (Signed)
CSN: [id number]     Arrival date & time 02/03/16  4098 History   First MD Initiated Contact with Patient 02/03/16 0901     Chief Complaint  Patient presents with  . Migraine     (Consider location/radiation/quality/duration/timing/severity/associated sxs/prior Treatment) HPI   Carrie [name] is a 45 y.o. female, with a history of migraine, presenting to the ED with a headache that began last night. Patient states that the headache feels like one of her typical migraines. The pain is bilateral in the temples and radiates towards the back of the head, rates it 9 out of 10, throbbing/aching. Patient denies that this headache is worse headache of her life. Patient states that she can usually curb her migraines if she takes her Excedrin migraine early enough, however, she was unable to take it in time last night. Patient also endorses nausea and 2 episodes of vomiting. Patient denies trauma/falls, fever/chills, rashes, dizziness, visual disturbances, or any other complaints.    Past Medical History  Diagnosis Date  . Chronic back pain   . Hypercholesteremia   . Migraine    Past Surgical History  Procedure Laterality Date  . Breast lumpectomy    . Dilation and curettage of uterus    . Cesarean section     Family History  Problem Relation Age of Onset  . Cancer Mother     breast cancer   . Multiple sclerosis Mother   . Thyroid disease Mother   . Cancer Maternal Grandmother    Social History  Substance Use Topics  . Smoking status: Current Every Day Smoker  . Smokeless tobacco: None  . Alcohol Use: No     Comment: OCCASIONAL   OB History    No data available     Review of Systems  Constitutional: Negative for fever, chills and diaphoresis.  Gastrointestinal: Positive for nausea and vomiting.  Musculoskeletal: Negative for back pain, neck pain and neck stiffness.  Skin: Negative for color change, pallor and rash.  Neurological: Positive for headaches. Negative for  dizziness, syncope, weakness, light-headedness and numbness.  All other systems reviewed and are negative.     Allergies  Review of patient's allergies indicates no known allergies.  Home Medications   Prior to Admission medications   Medication Sig Start Date End Date Taking? Authorizing Provider  aspirin-acetaminophen-caffeine (EXCEDRIN MIGRAINE) [id number] MG tablet Take 1 tablet by mouth every 6 (six) hours as needed for headache.   Yes Historical Provider, MD  ibuprofen (ADVIL,MOTRIN) 600 MG tablet Take 1 tablet (600 mg total) by mouth every 6 (six) hours as needed. 09/17/15  Yes Arby Barrette, MD  butalbital-acetaminophen-caffeine (FIORICET) [phone] MG tablet Take 1-2 tablets by mouth every 6 (six) hours as needed for headache. 02/03/16 02/02/17  Shawn C Joy, PA-C  ondansetron (ZOFRAN ODT) 4 MG disintegrating tablet Take 1 tablet (4 mg total) by mouth every 8 (eight) hours as needed for nausea or vomiting. 02/03/16   Shawn C Joy, PA-C  SUMAtriptan (IMITREX) 50 MG tablet Take 1 tablet (50 mg total) by mouth every 2 (two) hours as needed for migraine. May repeat in 2 hours if headache persists or recurs. 02/03/16   Shawn C Joy, PA-C   BP 121/68 mmHg  Pulse 78  Temp(Src) 97.7 F (36.5 C) (Oral)  Resp 17  Ht 5' 8.5" (1.74 m)  Wt 72.576 kg  BMI 23.97 kg/m2  SpO2 97%  LMP 01/15/2016 Physical Exam  Constitutional: She is oriented to person, place, and time. She appears  well-developed and well-nourished. No distress.  HENT:  Head: Normocephalic and atraumatic.  Eyes: Conjunctivae and EOM are normal. Pupils are equal, round, and reactive to light.  Neck: Normal range of motion. Neck supple.  Cardiovascular: Normal rate, regular rhythm, normal heart sounds and intact distal pulses.   Pulmonary/Chest: Effort normal and breath sounds normal. No respiratory distress.  Abdominal: Soft. There is no tenderness. There is no guarding.  Musculoskeletal: She exhibits no edema or tenderness.   Full ROM in all extremities and spine. No paraspinal tenderness.   Lymphadenopathy:    She has no cervical adenopathy.  Neurological: She is alert and oriented to person, place, and time. She has normal reflexes.  No sensory deficits. Strength 5/5 in all extremities. No gait disturbance. Coordination intact. Cranial nerves III-XII grossly intact. No facial droop.   Skin: Skin is warm and dry. She is not diaphoretic.  Psychiatric: She has a normal mood and affect. Her behavior is normal.  Nursing note and vitals reviewed.   ED Course  Procedures (including critical care time) Labs Review Labs Reviewed  BASIC METABOLIC PANEL - Abnormal; Notable for the following:    Glucose, Bld 100 (*)    All other components within normal limits  CBC - Abnormal; Notable for the following:    Hemoglobin 11.5 (*)    All other components within normal limits    Imaging Review No results found. I have personally reviewed and evaluated these lab results as part of my medical decision-making.   EKG Interpretation None      Medications  ondansetron (ZOFRAN-ODT) disintegrating tablet 4 mg (4 mg Oral Given 02/03/16 0853)  sodium chloride 0.9 % bolus 1,000 mL (0 mLs Intravenous Stopped 02/03/16 1130)  ondansetron (ZOFRAN) injection 4 mg (4 mg Intravenous Given 02/03/16 0927)  ketorolac (TORADOL) 30 MG/ML injection 30 mg (30 mg Intravenous Given 02/03/16 0927)  prochlorperazine (COMPAZINE) injection 10 mg (10 mg Intravenous Given 02/03/16 0927)  diphenhydrAMINE (BENADRYL) injection 25 mg (25 mg Intravenous Given 02/03/16 0927)  butalbital-acetaminophen-caffeine (FIORICET, ESGIC) 50-325-40 MG per tablet 2 tablet (2 tablets Oral Given 02/03/16 1015)    MDM   Final diagnoses:  Migraine without status migrainosus, not intractable, unspecified migraine type    [name] presents with a migraine that began last night.  This patient's presentation is consistent with a migraine and per the patient is  similar to previous migraines. Patient has a normal neuro exam. No red flag symptoms. No indication for imaging at this time. Upon reevaluation, patient states that her pain has decreased to about 6 or 7 out of 10 and then resolved almost completely. No additional symptoms. Neurology referral for recurrent migraines. Home care and return precautions discussed. Patient voices understanding of these instructions and is comfortable with discharge.   Filed Vitals:   02/03/16 0847 02/03/16 0904 02/03/16 1001 02/03/16 1121  BP: 121/68 113/71 94/59 99/58   Pulse: 78 73 79 64  Temp: 97.7 F (36.5 C)   97.9 F (36.6 C)  TempSrc: Oral   Oral  Resp: 17 16 16 18   Height: 5' 8.5" (1.74 m)     Weight: 72.576 kg     SpO2: 97% 99% 98% 97%       Anselm PancoastShawn C Joy, PA-C 02/03/16 1504  Marily MemosJason Mesner, MD 02/03/16 1649

## 2016-03-30 ENCOUNTER — Encounter (HOSPITAL_COMMUNITY): Payer: Self-pay | Admitting: Emergency Medicine

## 2016-03-30 ENCOUNTER — Emergency Department (HOSPITAL_COMMUNITY)
Admission: EM | Admit: 2016-03-30 | Discharge: 2016-03-30 | Disposition: A | Discharge status: Home / Self Care | Payer: Medicaid Other | Attending: Emergency Medicine | Admitting: Emergency Medicine

## 2016-03-30 DIAGNOSIS — Z7982 Long term (current) use of aspirin: Secondary | ICD-10-CM | POA: Diagnosis not present

## 2016-03-30 DIAGNOSIS — Z79899 Other long term (current) drug therapy: Secondary | ICD-10-CM | POA: Diagnosis not present

## 2016-03-30 DIAGNOSIS — F17200 Nicotine dependence, unspecified, uncomplicated: Secondary | ICD-10-CM | POA: Diagnosis not present

## 2016-03-30 DIAGNOSIS — G43909 Migraine, unspecified, not intractable, without status migrainosus: Secondary | ICD-10-CM | POA: Diagnosis not present

## 2016-03-30 MED ORDER — DIPHENHYDRAMINE HCL 50 MG/ML IJ SOLN
25.0000 mg | Freq: Once | INTRAMUSCULAR | Status: AC
  Administered 2016-03-30: 25 mg via INTRAVENOUS
  Filled 2016-03-30: qty 1

## 2016-03-30 MED ORDER — SODIUM CHLORIDE 0.9 % IV BOLUS (SEPSIS)
1000.0000 mL | Freq: Once | INTRAVENOUS | Status: AC
  Administered 2016-03-30: 1000 mL via INTRAVENOUS

## 2016-03-30 MED ORDER — SUMATRIPTAN SUCCINATE 50 MG PO TABS: 50.0000 mg | ORAL_TABLET | Freq: Once | ORAL | Status: DC

## 2016-03-30 MED ORDER — BUTALBITAL-APAP-CAFFEINE 50-325-40 MG PO TABS: 1.0000 | ORAL_TABLET | Freq: Four times a day (QID) | ORAL | Status: DC | PRN

## 2016-03-30 MED ORDER — PROCHLORPERAZINE EDISYLATE 5 MG/ML IJ SOLN
10.0000 mg | Freq: Once | INTRAMUSCULAR | Status: AC
Start: 2016-03-30 — End: 2016-03-30
  Administered 2016-03-30: 10 mg via INTRAVENOUS
  Filled 2016-03-30: qty 2

## 2016-03-30 NOTE — Discharge Instructions (Signed)

## 2016-03-30 NOTE — ED Notes (Signed)
Pt. reports left temporal migraine headache onset this week with mild nausea and photophobia .

## 2016-03-30 NOTE — ED Provider Notes (Signed)
CSN: [id number]     Arrival date & time 03/30/16  0020 History   By signing my name below, I, Carrie [name], attest that this documentation has been prepared under the direction and in the presence of Shon Batonourtney F Horton, MD.  Electronically Signed: Suzan SlickAshley N. Elon [name], ED Scribe. 03/30/2016. 3:21 AM.   Chief Complaint  Patient presents with  . Migraine   The history is provided by the patient. No language interpreter was used.    HPI Comments: Carrie [name] is a 45 y.o. female with a PMHx of migraines who presents to the Emergency Department complaining of constant, worsening HA x 2 days. Pt states "i can feel it behind my eyes and its getting worse". Currently she rates pain 8-9/10. She also reports "tunnel vision", blurred vision, and photophobia. OTC Ibuprofen 800 mg and Fioricet attempted at home without any improvement. No recent fever, chills, nausea, vomiting, weakness, or numbness. Pt states she was evaluated 1 month ago for same. At that time, she was given prescription for Fioricet and Imitrex. However, pt states she was unable to get Imitrex filled as "pharmacy refused b/c it was written by a PA".  PCP: Doris Cheadleeepak Advani, MD    Past Medical History  Diagnosis Date  . Chronic back pain   . Hypercholesteremia   . Migraine    Past Surgical History  Procedure Laterality Date  . Breast lumpectomy    . Dilation and curettage of uterus    . Cesarean section     Family History  Problem Relation Age of Onset  . Cancer Mother     breast cancer   . Multiple sclerosis Mother   . Thyroid disease Mother   . Cancer Maternal Grandmother    Social History  Substance Use Topics  . Smoking status: Current Every Day Smoker  . Smokeless tobacco: None  . Alcohol Use: No     Comment: OCCASIONAL   OB History    No data available     Review of Systems  Constitutional: Negative for fever and chills.  Eyes: Positive for photophobia and visual disturbance.  Gastrointestinal: Positive  for nausea. Negative for vomiting.  Neurological: Positive for headaches. Negative for weakness and numbness.  Psychiatric/Behavioral: Negative for confusion.  All other systems reviewed and are negative.     Allergies  Review of patient's allergies indicates no known allergies.  Home Medications   Prior to Admission medications   Medication Sig Start Date End Date Taking? Authorizing Provider  aspirin-acetaminophen-caffeine (EXCEDRIN MIGRAINE) [phone] MG tablet Take 1 tablet by mouth every 6 (six) hours as needed for headache.   Yes Historical Provider, MD  ibuprofen (ADVIL,MOTRIN) 800 MG tablet Take 800 mg by mouth every 8 (eight) hours as needed for headache or moderate pain.   Yes Historical Provider, MD  ondansetron (ZOFRAN ODT) 4 MG disintegrating tablet Take 1 tablet (4 mg total) by mouth every 8 (eight) hours as needed for nausea or vomiting. 02/03/16  Yes Shawn C Joy, PA-C  butalbital-acetaminophen-caffeine (FIORICET) 50-325-40 MG tablet Take 1 tablet by mouth every 6 (six) hours as needed for headache. 03/30/16 03/30/17  Shon Batonourtney F Horton, MD  SUMAtriptan (IMITREX) 50 MG tablet Take 1 tablet (50 mg total) by mouth once. May repeat in 2 hours if headache persists or recurs. 03/30/16   Shon Batonourtney F Horton, MD   Triage Vitals: BP 91/48 mmHg  Pulse 69  Temp(Src) 98.5 F (36.9 C) (Oral)  Resp 18  Wt 151 lb 3.2 oz (68.584  kg)  SpO2 97%   Physical Exam  Constitutional: She is oriented to person, place, and time. No distress.  Uncomfortable appearing, no acute distress  HENT:  Head: Normocephalic and atraumatic.  Mouth/Throat: Oropharynx is clear and moist.  Eyes: EOM are normal. Pupils are equal, round, and reactive to light.  Neck: Normal range of motion.  Cardiovascular: Normal rate, regular rhythm and normal heart sounds.   No murmur heard. Pulmonary/Chest: Effort normal and breath sounds normal. No respiratory distress. She has no wheezes.  Musculoskeletal: Normal range of  motion.  Neurological: She is alert and oriented to person, place, and time.  Cranial nerves II through XII intact, 5 out of 5 strength in all 4 extremities  Skin: Skin is warm and dry.  Psychiatric: She has a normal mood and affect.  Nursing note and vitals reviewed.   ED Course  Procedures (including critical care time)  DIAGNOSTIC STUDIES: Oxygen Saturation is 98% on RA, Normal by my interpretation.    COORDINATION OF CARE: 3:12 AM- Will give Benadryl, Compazine, and fluids. Discussed treatment plan with pt at bedside and pt agreed to plan.     Labs Review Labs Reviewed - No data to display  Imaging Review No results found. I have personally reviewed and evaluated these images and lab results as part of my medical decision-making.   EKG Interpretation None      MDM   Final diagnoses:  Migraine without status migrainosus, not intractable, unspecified migraine type    Patient presents with headache. Consistent with prior migraines. Nontoxic and nonfocal on exam. Afebrile. Patient was given a migraine cocktail. Doubt meningitis or subarachnoid hemorrhage.  5:10 AM On recheck, patient reports improvement of her symptoms. She is resting comfortably. Will discharge home with Imitrex and Fioricet prescriptions. Follow-up primary physician.  After history, exam, and medical workup I feel the patient has been appropriately medically screened and is safe for discharge home. Pertinent diagnoses were discussed with the patient. Patient was given return precautions.  I personally performed the services described in this documentation, which was scribed in my presence. The recorded information has been reviewed and is accurate.   Shon Batonourtney F Horton, MD 03/30/16 [phone]

## 2016-05-17 ENCOUNTER — Encounter (HOSPITAL_COMMUNITY): Payer: Self-pay | Admitting: *Deleted

## 2016-05-17 ENCOUNTER — Emergency Department (HOSPITAL_COMMUNITY)
Admission: EM | Admit: 2016-05-17 | Discharge: 2016-05-18 | Disposition: A | Discharge status: Home / Self Care | Payer: Medicaid Other | Attending: Emergency Medicine | Admitting: Emergency Medicine

## 2016-05-17 DIAGNOSIS — R1032 Left lower quadrant pain: Secondary | ICD-10-CM | POA: Insufficient documentation

## 2016-05-17 DIAGNOSIS — R109 Unspecified abdominal pain: Secondary | ICD-10-CM

## 2016-05-17 DIAGNOSIS — K6289 Other specified diseases of anus and rectum: Secondary | ICD-10-CM | POA: Diagnosis not present

## 2016-05-17 DIAGNOSIS — F17200 Nicotine dependence, unspecified, uncomplicated: Secondary | ICD-10-CM | POA: Diagnosis not present

## 2016-05-17 NOTE — ED Triage Notes (Signed)
States she was here before and told to follow up with GI but states she cannot afford the colonoscopy.

## 2016-05-17 NOTE — ED Notes (Signed)
Name was called but no answer 

## 2016-05-17 NOTE — ED Triage Notes (Signed)
Pt states that she has internal hemorrhoids that are bleeding. States that she uses  6 preparation H's per day with no relief. States that it is painful to sit.

## 2016-05-18 ENCOUNTER — Emergency Department (HOSPITAL_COMMUNITY): Payer: Medicaid Other

## 2016-05-18 ENCOUNTER — Encounter (HOSPITAL_COMMUNITY): Payer: Self-pay | Admitting: Radiology

## 2016-05-18 LAB — COMPREHENSIVE METABOLIC PANEL
ALBUMIN: 3.7 g/dL (ref 3.5–5.0)
ALK PHOS: 65 U/L (ref 38–126)
ALT: 16 U/L (ref 14–54)
ANION GAP: 7 (ref 5–15)
AST: 18 U/L (ref 15–41)
CALCIUM: 9 mg/dL (ref 8.9–10.3)
CO2: 25 mmol/L (ref 22–32)
Chloride: 107 mmol/L (ref 101–111)
Creatinine, Ser: 0.74 mg/dL (ref 0.44–1.00)
GFR calc Af Amer: >60 mL/min (ref >60)
GFR calc non Af Amer: >60 mL/min (ref >60)
GLUCOSE: 88 mg/dL (ref 65–99)
POTASSIUM: 3.2 mmol/L — AB (ref 3.5–5.1)
SODIUM: 139 mmol/L (ref 135–145)
Total Bilirubin: 0.6 mg/dL (ref 0.3–1.2)
Total Protein: 6.9 g/dL (ref 6.5–8.1)

## 2016-05-18 LAB — I-STAT BETA HCG BLOOD, ED (MC, WL, AP ONLY): I-stat hCG, quantitative: <5 m[IU]/mL (ref <5)

## 2016-05-18 LAB — URINALYSIS, ROUTINE W REFLEX MICROSCOPIC
Bilirubin Urine: NEGATIVE
GLUCOSE, UA: NEGATIVE mg/dL
Ketones, ur: NEGATIVE mg/dL
Leukocytes, UA: NEGATIVE
Nitrite: NEGATIVE
PH: 6 (ref 5.0–8.0)
Protein, ur: NEGATIVE mg/dL
SPECIFIC GRAVITY, URINE: 1.009 (ref 1.005–1.030)

## 2016-05-18 LAB — URINE MICROSCOPIC-ADD ON

## 2016-05-18 LAB — CBC WITH DIFFERENTIAL/PLATELET
Basophils Absolute: 0 10*3/uL (ref 0.0–0.1)
Basophils Relative: 0 %
Eosinophils Absolute: 0.1 10*3/uL (ref 0.0–0.7)
Eosinophils Relative: 1 %
HCT: 37.7 % (ref 36.0–46.0)
HEMOGLOBIN: 11.8 g/dL — AB (ref 12.0–15.0)
LYMPHS ABS: 1.7 10*3/uL (ref 0.7–4.0)
LYMPHS PCT: 24 %
MCH: 29.1 pg (ref 26.0–34.0)
MCHC: 31.3 g/dL (ref 30.0–36.0)
MCV: 93.1 fL (ref 78.0–100.0)
Monocytes Absolute: 0.5 10*3/uL (ref 0.1–1.0)
Monocytes Relative: 6 %
NEUTROS PCT: 69 %
Neutro Abs: 5 10*3/uL (ref 1.7–7.7)
Platelets: 276 10*3/uL (ref 150–400)
RBC: 4.05 MIL/uL (ref 3.87–5.11)
RDW: 12.2 % (ref 11.5–15.5)
WBC: 7.2 10*3/uL (ref 4.0–10.5)

## 2016-05-18 LAB — POC OCCULT BLOOD, ED: Fecal Occult Bld: NEGATIVE

## 2016-05-18 LAB — LIPASE, BLOOD: Lipase: 20 U/L (ref 11–51)

## 2016-05-18 MED ORDER — FENTANYL CITRATE (PF) 100 MCG/2ML IJ SOLN
50.0000 ug | Freq: Once | INTRAMUSCULAR | Status: AC
  Administered 2016-05-18: 50 ug via INTRAMUSCULAR
  Filled 2016-05-18: qty 2

## 2016-05-18 MED ORDER — IOPAMIDOL (ISOVUE-300) INJECTION 61%
INTRAVENOUS | Status: AC
  Administered 2016-05-18: 100 mL
  Filled 2016-05-18: qty 100

## 2016-05-18 MED ORDER — IBUPROFEN 800 MG PO TABS: 800.0000 mg | ORAL_TABLET | Freq: Two times a day (BID) | ORAL | 0 refills | Status: DC

## 2016-05-18 NOTE — Discharge Instructions (Signed)
Read the information below.  Your labs were re-assuring. Your fecal blood test was negative. CT of your abdomen and pelvis showed a moderate amount of stool in your colon. Of note, was a uterine fibroid and a cyst on the right ovary. I encourage you to eat a diet high in fiber for relief of constipation. I also encourage you to take miralax.  You can take tylenol or motrin for pain relief.  Please follow up with your primary care doctor for re-evaluation. If you would prefer to follow up with an OBGYN, [facility]  I have provided the contact information for gastroenterology for further evaluation and management of your rectal pain. Please call to schedule an appointment.   You may return to the Emergency Department at any time for worsening condition or any new symptoms that concern you. Return to ED if your symptoms worsen or you develop fever, inability to keep fluids down, vomiting, vomiting blood, chest pain, shortness of breath, or loss of consciousness.

## 2016-05-18 NOTE — ED Provider Notes (Signed)
[facility]Y DEPT Provider Note   CSN: [id number] Arrival date & time: 05/17/16  7829  First Provider Contact:  First MD Initiated Contact with Patient 05/18/16 0100        History   Chief Complaint Chief Complaint  Patient presents with  . Rectal Pain    HPI Carrie [name] is a 45 y.o. female.  Carrie [name] is a 45 y.o. female with history of chronic back pain, bipolar disorder,  hypercholesterolemia, and migraine presents to ED with complaint of rectal pain and lower abdominal pain. Patient states she had rectal bleeding approximately 2 years ago. Review of records indicate patient was seen in ED in 2011 for rectal bleeding. She states she was referred to follow up with GI for a colonoscopy for further evaluation. Patient did not follow up due to financial concerns. She comes to the ED with complaint of rectal pain, rectal bleeding, blood in stool, and abdominal pain. She states symptoms started on 04/23/16 and that she was passing "dark black clots" intermixed in her stool. She noted "red in toilet." She has associated rectal pain described as "knife in my butt" and "razor blades" sensation. She has a bowel movement every other day, which is normal for her. She also endorses lower abdominal pain. She denies N/V. No dysuria or hematuria. No fever. She has dyspareunia and complains of "heavy menstrual periods."    The history is provided by the patient and medical records.    Past Medical History:  Diagnosis Date  . Chronic back pain   . Hypercholesteremia   . Migraine     Patient Active Problem List   Diagnosis Date Noted  . Bipolar disorder, unspecified (HCC) 04/21/2014  . Chronic low back pain 04/21/2014  . Screening 04/21/2014  . Morning stiffness of joints 04/21/2014    Past Surgical History:  Procedure Laterality Date  . BREAST LUMPECTOMY    . CESAREAN SECTION    . DILATION AND CURETTAGE OF UTERUS      OB History    No data available       Home  Medications    Prior to Admission medications   Medication Sig Start Date End Date Taking? Authorizing Provider  butalbital-acetaminophen-caffeine (FIORICET) 50-325-40 MG tablet Take 1 tablet by mouth every 6 (six) hours as needed for headache. Patient not taking: Reported on 05/18/2016 03/30/16 03/30/17  Shon Baton, MD  ibuprofen (ADVIL,MOTRIN) 800 MG tablet Take 1 tablet (800 mg total) by mouth 2 (two) times daily. 05/18/16   Lona Kettle, PA-C  ondansetron (ZOFRAN ODT) 4 MG disintegrating tablet Take 1 tablet (4 mg total) by mouth every 8 (eight) hours as needed for nausea or vomiting. Patient not taking: Reported on 05/18/2016 02/03/16   Hillard Danker Joy, PA-C  SUMAtriptan (IMITREX) 50 MG tablet Take 1 tablet (50 mg total) by mouth once. May repeat in 2 hours if headache persists or recurs. Patient not taking: Reported on 05/18/2016 03/30/16   Shon Baton, MD    Family History Family History  Problem Relation Age of Onset  . Cancer Mother     breast cancer   . Multiple sclerosis Mother   . Thyroid disease Mother   . Cancer Maternal Grandmother     Social History Social History  Substance Use Topics  . Smoking status: Current Every Day Smoker  . Smokeless tobacco: Not on file  . Alcohol use No     Comment: OCCASIONAL     Allergies   Review  of patient's allergies indicates no known allergies.   Review of Systems Review of Systems  Constitutional: Negative for fever.  HENT: Negative for sore throat and trouble swallowing.   Eyes: Negative for visual disturbance.  Respiratory: Negative for shortness of breath.   Cardiovascular: Negative for chest pain.  Gastrointestinal: Positive for abdominal pain, blood in stool and rectal pain. Negative for constipation, diarrhea, nausea and vomiting.  Genitourinary: Positive for dyspareunia and menstrual problem ( states heavy menstrual cycle). Negative for dysuria, hematuria, pelvic pain, vaginal bleeding, vaginal discharge  and vaginal pain.  Musculoskeletal: Positive for back pain ( chronic).  Skin: Negative for rash.  Neurological: Negative for syncope.     Physical Exam Updated Vital Signs BP (!) 119/41   Pulse 65   Temp 98.3 F (36.8 C) (Oral)   Resp 18   Ht  (1.727 m)   Wt 68.5 kg   SpO2 96%   BMI 22.96 kg/m   Physical Exam  Constitutional: She appears well-developed and well-nourished. No distress.  HENT:  Head: Normocephalic and atraumatic.  Mouth/Throat: Oropharynx is clear and moist. No oropharyngeal exudate.  Eyes: Conjunctivae and EOM are normal. Pupils are equal, round, and reactive to light. Right eye exhibits no discharge. Left eye exhibits no discharge. No scleral icterus.  Neck: Normal range of motion. Neck supple.  Cardiovascular: Normal rate, regular rhythm, normal heart sounds and intact distal pulses.   No murmur heard. Pulmonary/Chest: Effort normal and breath sounds normal. No respiratory distress.  Abdominal: Soft. Bowel sounds are normal. She exhibits no distension. There is tenderness in the suprapubic area and left lower quadrant. There is no rigidity, no rebound, no guarding and no CVA tenderness.  Positive bowel sounds. Abdomen is soft and non-distended. TTP of LLQ and suprapubic without guarding or rigidity. No peritoneal signs.   Genitourinary: Rectal exam shows external hemorrhoid, internal hemorrhoid and tenderness. Rectal exam shows no fissure and anal tone normal. Pelvic exam was performed with patient prone.  Genitourinary Comments: Chaperone present for duration of exam. Non-thrombosed external hemorrhoids noted. No evidence of anal fissure. Tenderness on exam. Rectal tone normal. Internal hemorrhoids palpated. No stool noted in rectal vault. No frank blood noted on exam.   Musculoskeletal: Normal range of motion.  Lymphadenopathy:    She has no cervical adenopathy.  Neurological: She is alert. Coordination normal.  Skin: Skin is warm and dry. She is not  diaphoretic.  Psychiatric: She has a normal mood and affect. Her behavior is normal.     ED Treatments / Results  Labs (all labs ordered are listed, but only abnormal results are displayed) Labs Reviewed  COMPREHENSIVE METABOLIC PANEL - Abnormal; Notable for the following:       Result Value   Potassium 3.2 (*)    BUN <5 (*)    All other components within normal limits  CBC WITH DIFFERENTIAL/PLATELET - Abnormal; Notable for the following:    Hemoglobin 11.8 (*)    All other components within normal limits  URINALYSIS, ROUTINE W REFLEX MICROSCOPIC (NOT AT [facility]) - Abnormal; Notable for the following:    Hgb urine dipstick SMALL (*)    All other components within normal limits  URINE MICROSCOPIC-ADD ON - Abnormal; Notable for the following:    Squamous Epithelial / LPF 0-5 (*)    Bacteria, UA RARE (*)    All other components within normal limits  LIPASE, BLOOD  POC OCCULT BLOOD, ED  I-STAT BETA HCG BLOOD, ED ([facility], [facility], [facility] ONLY)  EKG  EKG Interpretation None       Radiology Ct Abdomen Pelvis W Contrast  Result Date: 05/18/2016 CLINICAL DATA:  Initial evaluation for acute right lower quadrant abdominal pain. EXAM: CT ABDOMEN AND PELVIS WITH CONTRAST TECHNIQUE: Multidetector CT imaging of the abdomen and pelvis was performed using the standard protocol following bolus administration of intravenous contrast. CONTRAST:  100mL ISOVUE-300 IOPAMIDOL (ISOVUE-300) INJECTION 61% COMPARISON:  Prior   CT from 12/03/2009. FINDINGS: Mild atelectatic changes seen dependently within the visualized lung bases. Visualized lung bases are otherwise clear. Vague 5 mm hyperdense lesion noted within the right hepatic lobe, too small the characterize, but suspected to reflect a small hemangioma. This is of doubtful clinical significance. Few additional tiny punctate hypodensities within the right hepatic lobe were also too small the characterize, but of doubtful clinical significance as well. Liver  otherwise unremarkable. Gallbladder within normal limits. No biliary dilatation. Spleen, adrenal glands, and pancreas demonstrate a normal contrast enhanced appearance. Kidneys are equal in size with symmetric enhancement. No nephrolithiasis, hydronephrosis, or focal enhancing renal mass. Stomach within normal limits. No evidence for bowel obstruction. Appendix visualized in the right lower quadrant and is of normal caliber and appearance associated inflammatory changes to suggest acute appendicitis. No abnormal wall thickening, mucosal enhancement, or inflammatory fat stranding seen about the bowels. Moderate amount retained stool within the colon. Bladder within normal limits. Fibroid noted within the uterus. Uterus otherwise unremarkable. 2 cm corpus luteal cyst noted within the right ovary. Left ovary unremarkable. No free air identified. Trace free fluid within the pelvis, suspected to be physiologic. No adenopathy. Normal intravascular enhancement seen throughout the intra-abdominal aorta and its branch vessels. No acute osseous abnormality. No worrisome lytic or blastic osseous lesions. IMPRESSION: 1. No CT evidence for acute intra-abdominal or pelvic process. Normal appendix. 2. 2 cm corpus luteal cyst within the right ovary. Query this as source of right lower quadrant abdominal pain. 3. Moderate amount of retained stool within the colon, suggestive of constipation. 4. Fibroid uterus. Electronically Signed   By: Rise MuBenjamin  McClintock M.D.   On: 05/18/2016 05:40    Procedures Procedures (including critical care time)  Medications Ordered in ED Medications  fentaNYL (SUBLIMAZE) injection 50 mcg (50 mcg Intramuscular Given 05/18/16 0243)  iopamidol (ISOVUE-300) 61 % injection (100 mLs  Contrast Given 05/18/16 0510)     Initial Impression / Assessment and Plan / ED Course  I have reviewed the triage vital signs and the nursing notes.  Pertinent labs & imaging results that were available during my  care of the patient were reviewed by me and considered in my medical decision making (see chart for details).  Clinical Course  Value Comment By Time   Patient endorses improvement in pain. She is resting comfortably.  Lona Kettleshley Laurel Jerrett Baldinger, [place] JerseyPA-C 08/10 [id number]  CT Abdomen Pelvis W Contrast Lung bases are clear. Normal cardiac silhouette. Normal appearing gallbladder, pancreas, spleen, and kidneys. Stool noted in colon. Right ovarian cyst present. Normal appearing abdominal aorta.  Lona KettleAshley Laurel Ellysa Parrack, PA-C 08/10 0600    Patient is afebrile and non-toxic appearing in NAD. Vital signs are stable. Physical exam remarkable for TTP of LLQ and suprapubic region. On rectal exam, patient has non-thrombosed external hemorrhoids and palpable internal hemorrhoids. No evidence of anal fissure. No frank blood on exam. No stool noted in rectal vault. Pain medication given in ED.   Patient endorses improvement in pain following pain medication. HCG negative -low suspicion for ectopic pregnancy. Hemoccult negative. CBC remarkable  for mild anemia, stable compared to previous values. CMP shows mild hypokalemia. Low suspicion for acute cholecystitis. Lipase normal - low suspicion for pancreatitis. U/A negative for UTI. Will CT abd/pelvis to further evaluate LLQ pain.  CT abdomen/pelvis acute intra-abdominal or pelvic pathology. Moderate stool noted in colon consistent with constipation, no obstruction, uterine fibroid present - this may explain heavy menstrual cycles and dyspareunia, and right ovarian cyst. Unclear etiology of rectal pain. Discussed findings with patient. Encouraged patient to follow up with GI for further evaluation of rectal pain, provided contact information. Discussed high fiber diet and use of miralax for constipation tx. Encouraged patient to follow up with OBGYN regarding cyst and fibroid. Patient concern about finances and insurance regarding seeing a specialist. She mentioned "charity clinics" at [facility]  and requesting information. Will talk with care management regarding follow up care. Discussed return precautions. Patient voiced understanding and is agreeable.   Final Clinical Impressions(s) / ED Diagnoses   Final diagnoses:  Abdominal pain, unspecified abdominal location  Rectal pain    New Prescriptions Discharge Medication List as of 05/18/2016  6:19 AM       Lona Kettle, PA-C 05/18/16 1422    Lavera Guise, MD 05/18/16 [phone]

## 2016-07-20 ENCOUNTER — Emergency Department (HOSPITAL_COMMUNITY)
Admission: EM | Admit: 2016-07-20 | Discharge: 2016-07-20 | Disposition: A | Discharge status: Home / Self Care | Payer: Medicaid Other | Attending: Emergency Medicine | Admitting: Emergency Medicine

## 2016-07-20 ENCOUNTER — Encounter (HOSPITAL_COMMUNITY): Payer: Self-pay | Admitting: Emergency Medicine

## 2016-07-20 ENCOUNTER — Emergency Department (HOSPITAL_COMMUNITY): Payer: Medicaid Other

## 2016-07-20 DIAGNOSIS — W208XXA Other cause of strike by thrown, projected or falling object, initial encounter: Secondary | ICD-10-CM | POA: Insufficient documentation

## 2016-07-20 DIAGNOSIS — Y929 Unspecified place or not applicable: Secondary | ICD-10-CM | POA: Diagnosis not present

## 2016-07-20 DIAGNOSIS — S9031XA Contusion of right foot, initial encounter: Secondary | ICD-10-CM | POA: Diagnosis not present

## 2016-07-20 DIAGNOSIS — Y939 Activity, unspecified: Secondary | ICD-10-CM | POA: Insufficient documentation

## 2016-07-20 DIAGNOSIS — Y999 Unspecified external cause status: Secondary | ICD-10-CM | POA: Insufficient documentation

## 2016-07-20 DIAGNOSIS — F17200 Nicotine dependence, unspecified, uncomplicated: Secondary | ICD-10-CM | POA: Insufficient documentation

## 2016-07-20 DIAGNOSIS — S99921A Unspecified injury of right foot, initial encounter: Secondary | ICD-10-CM | POA: Diagnosis present

## 2016-07-20 MED ORDER — IBUPROFEN 800 MG PO TABS
800.0000 mg | ORAL_TABLET | Freq: Once | ORAL | Status: AC
  Administered 2016-07-20: 800 mg via ORAL
  Filled 2016-07-20: qty 1

## 2016-07-20 MED ORDER — IBUPROFEN 600 MG PO TABS: 600.0000 mg | ORAL_TABLET | Freq: Four times a day (QID) | ORAL | 0 refills | Status: DC | PRN

## 2016-07-20 NOTE — ED Triage Notes (Signed)
Patient states that she was helping her sister move, had a washer machine land on top of right foot.  CSMTs and pulses intact.  Bruising and swelling noted to top of right foot.

## 2016-07-20 NOTE — Progress Notes (Signed)
Orthopedic Tech Progress Note Patient Details:  Harland GermanLeona S Pavlock Feb 07, 1971 [id number]  Ortho Devices Type of Ortho Device: Ace wrap, Crutches, Postop shoe/boot Ortho Device/Splint Location: rle Ortho Device/Splint Interventions: Ordered, Application   Trinna PostMartinez, Jaxx Huish J 07/20/2016, 4:18 AM

## 2016-07-20 NOTE — ED Provider Notes (Signed)
[facility]PT Provider Note   CSN: [id number] Arrival date & time: 07/20/16  0213     History   Chief Complaint Chief Complaint  Patient presents with  . Foot Pain    HPI Carrie [name] is a 45 y.o. female.  Patient presents to the emergency department with chief complaint of right foot pain. She states that she was moving a washing machine, and dropped it on her foot. She reports moderate to severe pain. She reports associated contusion. Denies any numbness, weakness, or tingling. She has not taken anything for her symptoms. Her symptoms are worsened with movement and palpation.   The history is provided by the patient. No language interpreter was used.    Past Medical History:  Diagnosis Date  . Chronic back pain   . Hypercholesteremia   . Migraine     Patient Active Problem List   Diagnosis Date Noted  . Bipolar disorder, unspecified 04/21/2014  . Chronic low back pain 04/21/2014  . Screening 04/21/2014  . Morning stiffness of joints 04/21/2014    Past Surgical History:  Procedure Laterality Date  . BREAST LUMPECTOMY    . CESAREAN SECTION    . DILATION AND CURETTAGE OF UTERUS      OB History    No data available       Home Medications    Prior to Admission medications   Medication Sig Start Date End Date Taking? Authorizing Provider  butalbital-acetaminophen-caffeine (FIORICET) 50-325-40 MG tablet Take 1 tablet by mouth every 6 (six) hours as needed for headache. Patient not taking: Reported on 05/18/2016 03/30/16 03/30/17  Shon Baton, MD  ibuprofen (ADVIL,MOTRIN) 600 MG tablet Take 1 tablet (600 mg total) by mouth every 6 (six) hours as needed. 07/20/16   Roxy Horseman, PA-C  ondansetron (ZOFRAN ODT) 4 MG disintegrating tablet Take 1 tablet (4 mg total) by mouth every 8 (eight) hours as needed for nausea or vomiting. Patient not taking: Reported on 05/18/2016 02/03/16   Hillard Danker Joy, PA-C  SUMAtriptan (IMITREX) 50 MG tablet Take 1 tablet  (50 mg total) by mouth once. May repeat in 2 hours if headache persists or recurs. Patient not taking: Reported on 05/18/2016 03/30/16   Shon Baton, MD    Family History Family History  Problem Relation Age of Onset  . Cancer Mother     breast cancer   . Multiple sclerosis Mother   . Thyroid disease Mother   . Cancer Maternal Grandmother     Social History Social History  Substance Use Topics  . Smoking status: Current Every Day Smoker  . Smokeless tobacco: Never Used  . Alcohol use No     Comment: OCCASIONAL     Allergies   Review of patient's allergies indicates no known allergies.   Review of Systems Review of Systems  Musculoskeletal: Positive for arthralgias and joint swelling.  All other systems reviewed and are negative.    Physical Exam Updated Vital Signs BP 94/59   Pulse 71   Temp 98.5 F (36.9 C) (Oral)   Resp 20   Ht 5\' 8"  (1.727 m)   Wt 61.7 kg   LMP 07/10/2016 (Exact Date)   SpO2 96%   BMI 20.68 kg/m   Physical Exam Physical Exam  Constitutional: Pt appears well-developed and well-nourished. No distress.  HENT:  Head: Normocephalic and atraumatic.  Eyes: Conjunctivae are normal.  Neck: Normal range of motion.  Cardiovascular: Normal rate, regular rhythm and intact distal pulses.   Capillary  refill < 3 sec  Pulmonary/Chest: Effort normal and breath sounds normal.  Musculoskeletal: Pt exhibits tenderness To palpation of the right dorsal foot, there is a 2 x 2 centimeter contusion. Pt exhibits no edema.  ROM: limited 2/2 pain  Neurological: Pt  is alert. Coordination normal.  Sensation 5/5 Strength limited 2/2 pain  Skin: Skin is warm and dry. Pt is not diaphoretic.  No tenting of the skin  Psychiatric: Pt has a normal mood and affect.  Nursing note and vitals reviewed.   ED Treatments / Results  Labs (all labs ordered are listed, but only abnormal results are displayed) Labs Reviewed - No data to display  EKG  EKG  Interpretation None       Radiology Dg Foot Complete Right  Result Date: 07/20/2016 CLINICAL DATA:  Right foot pain with swelling and bruising after dropping a washer on top of foot prior to arrival. EXAM: RIGHT FOOT COMPLETE - 3+ VIEW COMPARISON:  Radiographs 10/11/2011 FINDINGS: Mild degenerative change at the first metatarsal phalangeal joint. No acute fracture or subluxation. Instance note of os navicular. There is mild dorsal soft tissue edema. IMPRESSION: Dorsal soft tissue edema.  No acute fracture or subluxation. Electronically Signed   By: Rubye OaksMelanie  Ehinger M.D.   On: 07/20/2016 03:06    Procedures Procedures (including critical care time)  Medications Ordered in ED Medications  ibuprofen (ADVIL,MOTRIN) tablet 800 mg (not administered)     Initial Impression / Assessment and Plan / ED Course  I have reviewed the triage vital signs and the nursing notes.  Pertinent labs & imaging results that were available during my care of the patient were reviewed by me and considered in my medical decision making (see chart for details).  Clinical Course    Patient X-Ray negative for obvious fracture or dislocation.  Pt advised to follow up with orthopedics. Patient given post op shoe while in ED, conservative therapy recommended and discussed. Patient will be discharged home & is agreeable with above plan. Returns precautions discussed. Pt appears safe for discharge.   Final Clinical Impressions(s) / ED Diagnoses   Final diagnoses:  Contusion of right foot, initial encounter    New Prescriptions New Prescriptions   IBUPROFEN (ADVIL,MOTRIN) 600 MG TABLET    Take 1 tablet (600 mg total) by mouth every 6 (six) hours as needed.     Roxy Horsemanobert Leone Mobley, PA-C 07/20/16 0342    Layla MawKristen N Ward, DO 07/20/16 [phone]

## 2016-10-09 HISTORY — PX: ABDOMINAL HYSTERECTOMY: SHX81

## 2016-10-16 ENCOUNTER — Other Ambulatory Visit: Payer: Self-pay | Admitting: Obstetrics

## 2016-10-16 ENCOUNTER — Encounter: Payer: Self-pay | Admitting: Obstetrics

## 2016-10-16 ENCOUNTER — Ambulatory Visit (INDEPENDENT_AMBULATORY_CARE_PROVIDER_SITE_OTHER): Payer: Self-pay | Admitting: Obstetrics

## 2016-10-16 ENCOUNTER — Other Ambulatory Visit (HOSPITAL_COMMUNITY)
Admission: RE | Admit: 2016-10-16 | Discharge: 2016-10-16 | Disposition: A | Discharge status: Home / Self Care | Payer: BLUE CROSS/BLUE SHIELD | Source: Ambulatory Visit | Attending: Obstetrics | Admitting: Obstetrics

## 2016-10-16 VITALS — BP 117/77 | HR 61 | Ht 68.0 in | Wt 143.2 lb

## 2016-10-16 DIAGNOSIS — Z01419 Encounter for gynecological examination (general) (routine) without abnormal findings: Secondary | ICD-10-CM | POA: Diagnosis present

## 2016-10-16 DIAGNOSIS — Z113 Encounter for screening for infections with a predominantly sexual mode of transmission: Secondary | ICD-10-CM | POA: Diagnosis present

## 2016-10-16 DIAGNOSIS — Z124 Encounter for screening for malignant neoplasm of cervix: Secondary | ICD-10-CM

## 2016-10-16 DIAGNOSIS — R102 Pelvic and perineal pain: Secondary | ICD-10-CM

## 2016-10-16 DIAGNOSIS — Z1151 Encounter for screening for human papillomavirus (HPV): Secondary | ICD-10-CM | POA: Diagnosis present

## 2016-10-16 DIAGNOSIS — N939 Abnormal uterine and vaginal bleeding, unspecified: Secondary | ICD-10-CM

## 2016-10-16 DIAGNOSIS — Z01411 Encounter for gynecological examination (general) (routine) with abnormal findings: Secondary | ICD-10-CM

## 2016-10-16 DIAGNOSIS — Z1231 Encounter for screening mammogram for malignant neoplasm of breast: Secondary | ICD-10-CM

## 2016-10-16 DIAGNOSIS — N393 Stress incontinence (female) (male): Secondary | ICD-10-CM

## 2016-10-16 DIAGNOSIS — Z0000 Encounter for general adult medical examination without abnormal findings: Secondary | ICD-10-CM

## 2016-10-16 MED ORDER — KETOROLAC TROMETHAMINE 60 MG/2ML IM SOLN
60.0000 mg | Freq: Once | INTRAMUSCULAR | Status: AC
  Administered 2016-10-16: 60 mg via INTRAMUSCULAR

## 2016-10-16 MED ORDER — IBUPROFEN 800 MG PO TABS: 800.0000 mg | ORAL_TABLET | Freq: Three times a day (TID) | ORAL | 5 refills | Status: DC | PRN

## 2016-10-16 NOTE — Progress Notes (Signed)
Subjective:        Carrie [name] is a 46 y.o. female here for a routine exam.  Current complaints: Heavy, painful, irreguluar and prolonged periods with clots.  Also has leaking of urine with cough, sneeze, etc.  Has to wear a pad for leaking of urine.  Intercourse is very painful and sex life is adversely affected by the pain and excessive vaginal bleeding.  Personal health questionnaire:  Is patient Ashkenazi Jewish, have a family history of breast and/or ovarian cancer: no Is there a family history of uterine cancer diagnosed at age < 42, gastrointestinal cancer, urinary tract cancer, family member who is a Personnel officer syndrome-associated carrier: no Is the patient overweight and hypertensive, family history of diabetes, personal history of gestational diabetes, preeclampsia or PCOS: no Is patient over 51, have PCOS,  family history of premature CHD under age 23, diabetes, smoke, have hypertension or peripheral artery disease:  no At any time, has a partner hit, kicked or otherwise hurt or frightened you?: no Over the past 2 weeks, have you felt down, depressed or hopeless?: no Over the past 2 weeks, have you felt little interest or pleasure in doing things?:no   Gynecologic History Patient's last menstrual period was 09/20/2016. Contraception: tubal ligation Last Pap: 2009. Results were: normal Last mammogram: 2009. Results were: normal  Obstetric History OB History  Gravida Para Term Preterm AB Living  7 5 5   2 5   SAB TAB Ectopic Multiple Live Births  1   1   5     # Outcome Date GA Lbr Len/2nd Weight Sex Delivery Anes PTL Lv  7 Term           6 Term           5 Term           4 Term           3 Term           2 Ectopic           1 SAB               Past Medical History:  Diagnosis Date  . Chronic back pain   . Hypercholesteremia   . Migraine     Past Surgical History:  Procedure Laterality Date  . BREAST LUMPECTOMY    . CESAREAN SECTION    . DILATION AND  CURETTAGE OF UTERUS       Current Outpatient Prescriptions:  .  butalbital-acetaminophen-caffeine (FIORICET) 50-325-40 MG tablet, Take 1 tablet by mouth every 6 (six) hours as needed for headache. (Patient not taking: Reported on 05/18/2016), Disp: 20 tablet, Rfl: 0 .  ibuprofen (ADVIL,MOTRIN) 600 MG tablet, Take 1 tablet (600 mg total) by mouth every 6 (six) hours as needed. (Patient not taking: Reported on 10/16/2016), Disp: 30 tablet, Rfl: 0 .  ondansetron (ZOFRAN ODT) 4 MG disintegrating tablet, Take 1 tablet (4 mg total) by mouth every 8 (eight) hours as needed for nausea or vomiting. (Patient not taking: Reported on 05/18/2016), Disp: 20 tablet, Rfl: 0 .  SUMAtriptan (IMITREX) 50 MG tablet, Take 1 tablet (50 mg total) by mouth once. May repeat in 2 hours if headache persists or recurs. (Patient not taking: Reported on 05/18/2016), Disp: 10 tablet, Rfl: 0 No Known Allergies  Social History  Substance Use Topics  . Smoking status: Former Games developer  . Smokeless tobacco: Current User  . Alcohol use No     Comment: OCCASIONAL  Family History  Problem Relation Age of Onset  . Cancer Mother     breast cancer   . Multiple sclerosis Mother   . Thyroid disease Mother   . Cancer Maternal Grandmother       Review of Systems  Constitutional: negative for fatigue and weight loss Respiratory: negative for cough and wheezing Cardiovascular: negative for chest pain, fatigue and palpitations Gastrointestinal: negative for abdominal pain and change in bowel habits Musculoskeletal:negative for myalgias Neurological: negative for gait problems and tremors Behavioral/Psych: negative for abusive relationship, depression Endocrine: negative for temperature intolerance    Genitourinary:negative for abnormal menstrual periods, genital lesions, hot flashes, sexual problems and vaginal discharge Integument/breast: negative for breast lump, breast tenderness, nipple discharge and skin lesion(s)     Objective:       BP 117/77   Pulse 61   Ht 5\' 8"  (1.727 m)   Wt 143 lb 3.2 oz (65 kg)   LMP 09/20/2016 Comment: irregula, long and heavy  BMI 21.77 kg/m  General:   alert  Skin:   no rash or abnormalities  Lungs:   clear to auscultation bilaterally  Heart:   regular rate and rhythm, S1, S2 normal, no murmur, click, rub or gallop  Breasts:   normal without suspicious masses, skin or nipple changes or axillary nodes  Abdomen:  normal findings: no organomegaly, soft, non-tender and no hernia  Pelvis:  External genitalia: normal general appearance Urinary system: urethral meatus normal and bladder without fullness, nontender Vaginal: tender with introduction of speculum, but  without  Induration, masses or discharge Cervix: normal appearance Adnexa:bilateral adnexal tenderness, no masses Uterus: anteverted and tender, normal size   Lab Review Urine pregnancy test Labs reviewed yes Radiologic studies reviewed yes  CT Abdomen Pelvis W Contrast (Accession [id number]) (Order [id number])  Imaging  Date: 05/18/2016 [facility] Released By/Authorizing: Lona KettleAshley Laurel Meyer, PA-C (auto-released)  Exam Information   Status Exam Begun  Exam Ended   Final [99] 05/18/2016 5:09 AM 05/18/2016 5:27 AM  PACS Images   Show images for CT Abdomen Pelvis W Contrast  Study Result   CLINICAL DATA:  Initial evaluation for acute right lower quadrant abdominal pain.  EXAM: CT ABDOMEN AND PELVIS WITH CONTRAST  TECHNIQUE: Multidetector CT imaging of the abdomen and pelvis was performed using the standard protocol following bolus administration of intravenous contrast.  CONTRAST:  100mL ISOVUE-300 IOPAMIDOL (ISOVUE-300) INJECTION 61%  COMPARISON:  Prior   CT from 12/03/2009.  FINDINGS: Mild atelectatic changes seen dependently within the visualized lung bases. Visualized lung bases are otherwise clear.  Vague 5 mm hyperdense lesion  noted within the right hepatic lobe, too small the characterize, but suspected to reflect a small hemangioma. This is of doubtful clinical significance. Few additional tiny punctate hypodensities within the right hepatic lobe were also too small the characterize, but of doubtful clinical significance as well. Liver otherwise unremarkable. Gallbladder within normal limits. No biliary dilatation. Spleen, adrenal glands, and pancreas demonstrate a normal contrast enhanced appearance.  Kidneys are equal in size with symmetric enhancement. No nephrolithiasis, hydronephrosis, or focal enhancing renal mass.  Stomach within normal limits. No evidence for bowel obstruction. Appendix visualized in the right lower quadrant and is of normal caliber and appearance associated inflammatory changes to suggest acute appendicitis. No abnormal wall thickening, mucosal enhancement, or inflammatory fat stranding seen about the bowels. Moderate amount retained stool within the colon.  Bladder within normal limits.  Fibroid noted within the uterus. Uterus otherwise  unremarkable. 2 cm corpus luteal cyst noted within the right ovary. Left ovary unremarkable.  No free air identified. Trace free fluid within the pelvis, suspected to be physiologic. No adenopathy. Normal intravascular enhancement seen throughout the intra-abdominal aorta and its branch vessels.  No acute osseous abnormality. No worrisome lytic or blastic osseous lesions.  IMPRESSION: 1. No CT evidence for acute intra-abdominal or pelvic process. Normal appendix. 2. 2 cm corpus luteal cyst within the right ovary. Query this as source of right lower quadrant abdominal pain. 3. Moderate amount of retained stool within the colon, suggestive of constipation. 4. Fibroid uterus.   Electronically Signed   By: Rise Mu M.D.   On: 05/18/2016 05:40     50% of 20 min visit spent on counseling and coordination of care.     Assessment:    Pelvic pain  AUB  SUI   Plan:    Referred to Urogynecology for evaluation of SUI and Incompacitating Pelvic Pain   Education reviewed: calcium supplements, depression evaluation, safe sex/STD prevention, self breast exams and weight bearing exercise. Contraception: tubal ligation. Mammogram ordered. Follow up in: 3 weeks.  Needs Endometrial Biopsy done for AUB  No orders of the defined types were placed in this encounter.  Orders Placed This Encounter  Procedures  . Urine culture  . Comprehensive metabolic panel  . CBC  . TSH     Patient ID: GUIDA ASMAN, female   DOB: 23-Apr-1971, 46 y.o.   MRN: [id number]   Patient ID: CARLISSA PESOLA, female   DOB: [birth year], 46 y.o.   MRN: [id number]

## 2016-10-17 LAB — COMPREHENSIVE METABOLIC PANEL
A/G RATIO: 1.5 (ref 1.2–2.2)
ALT: 7 IU/L (ref 0–32)
AST: 12 IU/L (ref 0–40)
Albumin: 4 g/dL (ref 3.5–5.5)
Alkaline Phosphatase: 75 IU/L (ref 39–117)
BILIRUBIN TOTAL: 0.4 mg/dL (ref 0.0–1.2)
BUN/Creatinine Ratio: 6 — ABNORMAL LOW (ref 9–23)
BUN: 5 mg/dL — ABNORMAL LOW (ref 6–24)
CALCIUM: 8.9 mg/dL (ref 8.7–10.2)
CO2: 23 mmol/L (ref 18–29)
Chloride: 102 mmol/L (ref 96–106)
Creatinine, Ser: 0.82 mg/dL (ref 0.57–1.00)
GFR, EST AFRICAN AMERICAN: 101 mL/min/{1.73_m2} (ref >59)
GFR, EST NON AFRICAN AMERICAN: 87 mL/min/{1.73_m2} (ref >59)
GLOBULIN, TOTAL: 2.7 g/dL (ref 1.5–4.5)
Glucose: 92 mg/dL (ref 65–99)
POTASSIUM: 3.8 mmol/L (ref 3.5–5.2)
SODIUM: 139 mmol/L (ref 134–144)
TOTAL PROTEIN: 6.7 g/dL (ref 6.0–8.5)

## 2016-10-17 LAB — CBC
Hematocrit: 38.3 % (ref 34.0–46.6)
Hemoglobin: 12.4 g/dL (ref 11.1–15.9)
MCH: 29.6 pg (ref 26.6–33.0)
MCHC: 32.4 g/dL (ref 31.5–35.7)
MCV: 91 fL (ref 79–97)
PLATELETS: 305 10*3/uL (ref 150–379)
RBC: 4.19 x10E6/uL (ref 3.77–5.28)
RDW: 13.1 % (ref 12.3–15.4)
WBC: 6.9 10*3/uL (ref 3.4–10.8)

## 2016-10-17 LAB — TSH: TSH: 0.636 u[IU]/mL (ref 0.450–4.500)

## 2016-10-18 LAB — CYTOLOGY - PAP
DIAGNOSIS: NEGATIVE
HPV: NOT DETECTED

## 2016-10-18 LAB — URINE CULTURE: Organism ID, Bacteria: NO GROWTH

## 2016-10-18 LAB — CERVICOVAGINAL ANCILLARY ONLY
Bacterial vaginitis: POSITIVE — AB
Candida vaginitis: POSITIVE — AB
Chlamydia: NEGATIVE
NEISSERIA GONORRHEA: NEGATIVE
TRICH (WINDOWPATH): NEGATIVE

## 2016-10-19 ENCOUNTER — Encounter: Payer: Self-pay | Admitting: Family Medicine

## 2016-10-19 ENCOUNTER — Other Ambulatory Visit: Payer: Self-pay | Admitting: Obstetrics

## 2016-10-19 ENCOUNTER — Ambulatory Visit (INDEPENDENT_AMBULATORY_CARE_PROVIDER_SITE_OTHER): Payer: BLUE CROSS/BLUE SHIELD | Admitting: Family Medicine

## 2016-10-19 VITALS — BP 92/62 | HR 74 | Temp 98.8°F | Resp 16 | Ht 68.0 in | Wt 142.0 lb

## 2016-10-19 DIAGNOSIS — F0630 Mood disorder due to known physiological condition, unspecified: Secondary | ICD-10-CM

## 2016-10-19 DIAGNOSIS — B373 Candidiasis of vulva and vagina: Secondary | ICD-10-CM

## 2016-10-19 DIAGNOSIS — Z636 Dependent relative needing care at home: Secondary | ICD-10-CM | POA: Diagnosis not present

## 2016-10-19 DIAGNOSIS — N760 Acute vaginitis: Principal | ICD-10-CM

## 2016-10-19 DIAGNOSIS — B9689 Other specified bacterial agents as the cause of diseases classified elsewhere: Secondary | ICD-10-CM

## 2016-10-19 DIAGNOSIS — B3731 Acute candidiasis of vulva and vagina: Secondary | ICD-10-CM

## 2016-10-19 MED ORDER — LAMOTRIGINE 25 MG PO TABS: 25.0000 mg | ORAL_TABLET | Freq: Every day | ORAL | 0 refills | Status: DC

## 2016-10-19 MED ORDER — FLUOXETINE HCL 20 MG PO CAPS: 20.0000 mg | ORAL_CAPSULE | Freq: Every day | ORAL | 0 refills | Status: DC

## 2016-10-19 MED ORDER — TINIDAZOLE 500 MG PO TABS: 1000.0000 mg | ORAL_TABLET | Freq: Every day | ORAL | 2 refills | Status: DC

## 2016-10-19 MED ORDER — BUTALBITAL-APAP-CAFFEINE 50-325-40 MG PO TABS: 1.0000 | ORAL_TABLET | Freq: Four times a day (QID) | ORAL | 0 refills | Status: DC | PRN

## 2016-10-19 MED ORDER — SUMATRIPTAN SUCCINATE 50 MG PO TABS: 50.0000 mg | ORAL_TABLET | Freq: Once | ORAL | 2 refills | Status: DC

## 2016-10-19 MED ORDER — FLUCONAZOLE 150 MG PO TABS: 150.0000 mg | ORAL_TABLET | Freq: Once | ORAL | 2 refills | Status: DC

## 2016-10-19 MED ORDER — DOXEPIN HCL 25 MG PO CAPS: 25.0000 mg | ORAL_CAPSULE | Freq: Every day | ORAL | 0 refills | Status: DC

## 2016-10-19 NOTE — Patient Instructions (Signed)
     IF you received an x-ray today, you will receive an invoice from [facility]. Please contact [facility] at [phone] with questions or concerns regarding your invoice.   IF you received labwork today, you will receive an invoice from [facility]. Please contact [facility] at [phone] with questions or concerns regarding your invoice.   Our billing staff will not be able to assist you with questions regarding bills from these companies.  You will be contacted with the lab results as soon as they are available. The fastest way to get your results is to activate your My Chart account. Instructions are located on the last page of this paperwork. If you have not heard from us regarding the results in 2 weeks, please contact this office.     

## 2016-10-19 NOTE — Progress Notes (Signed)
Subjective:    Patient ID: Carrie [name], female    DOB: 1971-02-24, 46 y.o.   MRN: [id number] Chief Complaint  Patient presents with  . Establish Care  . Anxiety    pt would like a low dose of xanax  . Medication Refill    imitrex, fioricet, prozac, limictal    HPI  Carrie [name] is a 46 yo woman Who is here to establish care. It appears that she has received the majority of her care through the emergency room. She was seen once at [facility] 2 and half years prior.   She had a visit with Dr. Clearance Coots, OB/GYN, 3 days prior for her well woman care. They discussed her metromenorrhagia, dyspareunia, and stress incontinence and he referred her to urogynecology as well as ordered an endometrial biopsy. Status post BTL. Mammogram was ordered. Pap was normal with negative high risk HPV the patient did have BV and yeast. Normal TSH, CBC, CMP several days prior.  Patient has 5 children down to 26 yo and 11 grandchildren  Pt was seen prev at [facility] doing well on klonopin, then [facility] took over and they took everyone off of the klonopin.  And she was so severe that she couldn't leave her bedroom even to go to her own restroom. Then she was transitioned to a neuropsychiatric center where she was put on a mess of stuff all to do the same job.  Now she has young kids at home and she if veyr agoraphobic.  Taking care of her elderly dad as well and so has to take him to his medical problems as well. She has been off of her prozac and lamictal for several months. She is very nervous at baseline. She is married.  prozac 50mg  bid, lamictal 25 bid Propranolol did not help anxiety, doxepin for chronic insomnia which helped a ton along with restless legs and back pain.  For the past 18 mos and it was working well. She was on medicaid at the time.  Crystal Tressie Ellis at [facility] until a couple of years.   She was wanting to start establishing with Hulen Shouts.  She did not know her appt today was scheduled with me.    Chronic medical conditions Chronic back pain: In 2015 MRI lumbar spine into the ventral which reported degeneration and bulging of the disc at L2-L3. Patient also had her cervical MRI done which reported mild DJD and central protrusion at C5-C6. As per patient she had seen orthopedic doctors in the past and was recommended for pain management. + FHx of RA HLD: Last lipid panel 20/15 with LDL 141 and non-HDL 181 so patient was advised to work on lifestyle change. Migraine: Used when necessary Imitrex, ibuprofen, and Fioricet prior.  Migraines start with a twinge in left temple and if she takes a fioricet with a benadryl and lays down she can usually prevent them. If that fails, then she will take a imitrex. Ibuprofen 800mg  ineffective for migraines and causes abd pain.  She gets migraines 3x/mo or more Bipolar d/o: seen psych in past. In 2015 was on buspar 15 qd prn, clonidine 0.1 bid, prozac 40 qd, and lamictal 25 bid.  buspar and clonidine did not hep at all. Clonidine made her gain weight.  Former smoker but does use smokeless tobacco. Occasional alcohol.  Mother with breast cancer, multiple sclerosis, and thyroid disease.  Past Medical History:  Diagnosis Date  . Chronic back pain   .  Hypercholesteremia   . Migraine    Past Surgical History:  Procedure Laterality Date  . BREAST LUMPECTOMY    . CESAREAN SECTION    . DILATION AND CURETTAGE OF UTERUS     Current Outpatient Prescriptions on File Prior to Visit  Medication Sig Dispense Refill  . ibuprofen (ADVIL,MOTRIN) 600 MG tablet Take 1 tablet (600 mg total) by mouth every 6 (six) hours as needed. 30 tablet 0  . ibuprofen (ADVIL,MOTRIN) 800 MG tablet Take 1 tablet (800 mg total) by mouth every 8 (eight) hours as needed. 30 tablet 5  . ondansetron (ZOFRAN ODT) 4 MG disintegrating tablet Take 1 tablet (4 mg total) by mouth every 8 (eight) hours as needed for nausea or  vomiting. (Patient not taking: Reported on 10/19/2016) 20 tablet 0  . [DISCONTINUED] famotidine (PEPCID) 20 MG tablet Take 1 tablet (20 mg total) by mouth 2 (two) times daily. 10 tablet 0   No current facility-administered medications on file prior to visit.    No Known Allergies Family History  Problem Relation Age of Onset  . Cancer Mother     breast cancer   . Multiple sclerosis Mother   . Thyroid disease Mother   . Cancer Maternal Grandmother    Social History   Social History  . Marital status: Married    Spouse name: N/A  . Number of children: N/A  . Years of education: N/A   Social History Main Topics  . Smoking status: Former Games developer  . Smokeless tobacco: Current User  . Alcohol use No     Comment: OCCASIONAL  . Drug use: No     Comment: reports cocaine addict 12 yrs ago  . Sexual activity: Yes    Partners: Male    Birth control/ protection: Surgical   Other Topics Concern  . None   Social History Narrative  . None   Depression screen [facility] 2/9 10/19/2016 04/21/2014  Decreased Interest 0 0  Down, Depressed, Hopeless 0 0  PHQ - 2 Score 0 0    Review of Systems See hpi    Objective:   Physical Exam  Constitutional: She is oriented to person, place, and time. She appears well-developed and well-nourished. No distress.  HENT:  Head: Normocephalic and atraumatic.  Right Ear: External ear normal.  Left Ear: External ear normal.  Eyes: Conjunctivae are normal. No scleral icterus.  Neck: Normal range of motion. Neck supple. No thyromegaly present.  Cardiovascular: Normal rate, regular rhythm, normal heart sounds and intact distal pulses.   Pulmonary/Chest: Effort normal and breath sounds normal. No respiratory distress.  Musculoskeletal: She exhibits no edema.  Lymphadenopathy:    She has no cervical adenopathy.  Neurological: She is alert and oriented to person, place, and time.  Skin: Skin is warm and dry. She is not diaphoretic. No erythema.  Psychiatric:  She has a normal mood and affect. Her behavior is normal.      BP 92/62   Pulse 74   Temp 98.8 F (37.1 C) (Oral)   Resp 16   Ht 5\' 8"  (1.727 m)   Wt 142 lb (64.4 kg)   LMP 10/18/2016 Comment: irregula, long and heavy  SpO2 97%   BMI 21.59 kg/m      Assessment & Plan:  All screening labs done within the year other than lipid panel. Patient not fasting today. 2015 showed patient with normal vitamin D, negative AMA, negative RF.  1. Mood disorder in conditions classified elsewhere   2. Caregiver  burden    Pt requesting to start xanax which she reports she has done well on prior.   Pt also states that she wants to establish care with my colleague Marco CollieWhitney McVey who care for her father. Advised that at this point we are restarting on so many of her other prior medications which will need to be weaned up over time that I don't think we should also restart on alprazolam simultaneously - since this is a controlled med she has been off for a while, would be best to wean up to stable doses on her other chronic meds while she is establishing a good therapeutic relationship with her desired PCP and then address poss need for alprazolam or other anxiolytic or sedative. Pt very agreeable with plan.   Meds ordered this encounter  Medications  . DISCONTD: FLUoxetine HCl (PROZAC PO)    Sig: Take 50 mg by mouth.  . DISCONTD: lamoTRIgine (LAMICTAL) 25 MG tablet    Sig: Take 25 mg by mouth daily.  Marland Kitchen. FLUoxetine (PROZAC) 20 MG capsule    Sig: Take 1 capsule (20 mg total) by mouth daily.    Dispense:  30 capsule    Refill:  0  . lamoTRIgine (LAMICTAL) 25 MG tablet    Sig: Take 1 tablet (25 mg total) by mouth daily.    Dispense:  30 tablet    Refill:  0  . butalbital-acetaminophen-caffeine (FIORICET) 50-325-40 MG tablet    Sig: Take 1 tablet by mouth every 6 (six) hours as needed for headache.    Dispense:  20 tablet    Refill:  0  . SUMAtriptan (IMITREX) 50 MG tablet    Sig: Take 1 tablet  (50 mg total) by mouth once. May repeat in 2 hours if headache persists or recurs.    Dispense:  9 tablet    Refill:  2  . doxepin (SINEQUAN) 25 MG capsule    Sig: Take 1 capsule (25 mg total) by mouth at bedtime.    Dispense:  30 capsule    Refill:  0     Norberto SorensonEva Tyner Codner, M.D.  [facility]  [place] [address] [place], [place] [address] [phone] phone [phone] fax  11/14/16 9:34 PM

## 2016-10-30 ENCOUNTER — Ambulatory Visit
Admission: RE | Admit: 2016-10-30 | Discharge: 2016-10-30 | Disposition: A | Discharge status: Home / Self Care | Payer: BLUE CROSS/BLUE SHIELD | Source: Ambulatory Visit | Attending: Obstetrics | Admitting: Obstetrics

## 2016-10-30 ENCOUNTER — Other Ambulatory Visit: Payer: Self-pay | Admitting: Obstetrics

## 2016-10-30 DIAGNOSIS — Z1231 Encounter for screening mammogram for malignant neoplasm of breast: Secondary | ICD-10-CM

## 2016-10-30 DIAGNOSIS — N6310 Unspecified lump in the right breast, unspecified quadrant: Secondary | ICD-10-CM

## 2016-10-30 DIAGNOSIS — Z01411 Encounter for gynecological examination (general) (routine) with abnormal findings: Secondary | ICD-10-CM

## 2016-10-30 DIAGNOSIS — Z9889 Other specified postprocedural states: Secondary | ICD-10-CM

## 2016-10-31 ENCOUNTER — Other Ambulatory Visit: Payer: Self-pay | Admitting: Obstetrics

## 2016-10-31 ENCOUNTER — Ambulatory Visit
Admission: RE | Admit: 2016-10-31 | Discharge: 2016-10-31 | Disposition: A | Discharge status: Home / Self Care | Payer: BLUE CROSS/BLUE SHIELD | Source: Ambulatory Visit | Attending: Obstetrics | Admitting: Obstetrics

## 2016-10-31 DIAGNOSIS — N6310 Unspecified lump in the right breast, unspecified quadrant: Secondary | ICD-10-CM

## 2016-10-31 HISTORY — PX: BREAST BIOPSY: SHX20

## 2016-11-06 ENCOUNTER — Ambulatory Visit: Payer: BLUE CROSS/BLUE SHIELD | Admitting: Obstetrics

## 2016-11-08 ENCOUNTER — Encounter: Payer: Self-pay | Admitting: Gastroenterology

## 2016-11-24 ENCOUNTER — Ambulatory Visit: Payer: BLUE CROSS/BLUE SHIELD | Admitting: Obstetrics

## 2016-12-04 ENCOUNTER — Ambulatory Visit (INDEPENDENT_AMBULATORY_CARE_PROVIDER_SITE_OTHER): Payer: BLUE CROSS/BLUE SHIELD | Admitting: Physician Assistant

## 2016-12-04 VITALS — BP 92/56 | HR 62 | Temp 98.4°F | Resp 16 | Ht 68.0 in | Wt 139.0 lb

## 2016-12-04 DIAGNOSIS — F0630 Mood disorder due to known physiological condition, unspecified: Secondary | ICD-10-CM

## 2016-12-04 DIAGNOSIS — F4001 Agoraphobia with panic disorder: Secondary | ICD-10-CM | POA: Diagnosis not present

## 2016-12-04 DIAGNOSIS — F411 Generalized anxiety disorder: Secondary | ICD-10-CM

## 2016-12-04 DIAGNOSIS — G43819 Other migraine, intractable, without status migrainosus: Secondary | ICD-10-CM

## 2016-12-04 DIAGNOSIS — Z79899 Other long term (current) drug therapy: Secondary | ICD-10-CM

## 2016-12-04 MED ORDER — FLUOXETINE HCL 20 MG PO CAPS: 20.0000 mg | ORAL_CAPSULE | Freq: Two times a day (BID) | ORAL | 0 refills | Status: DC

## 2016-12-04 MED ORDER — LAMOTRIGINE 100 MG PO TABS: 50.0000 mg | ORAL_TABLET | Freq: Every day | ORAL | 0 refills | Status: DC

## 2016-12-04 MED ORDER — ALPRAZOLAM 0.25 MG PO TABS: 0.2500 mg | ORAL_TABLET | Freq: Two times a day (BID) | ORAL | 0 refills | Status: DC | PRN

## 2016-12-04 MED ORDER — BUTALBITAL-APAP-CAFFEINE 50-325-40 MG PO TABS: 1.0000 | ORAL_TABLET | Freq: Four times a day (QID) | ORAL | 0 refills | Status: DC | PRN

## 2016-12-04 NOTE — Progress Notes (Signed)
Carrie [name]  MRN: [id number] DOB: 01/03/1971  PCP: Doris Cheadle, MD  Subjective:  Pt is a 46 year old female PMH bipolar disorder, panic attacks, insomnia and migraines who presents to clinic for medication management. She has previously received most of her care from the emergency department, however recently established care here at PCP.  Last OV was with Dr. Clelia Croft on 10/20/2015 for the same. She was started on Lamictal 25mg  and Prozac 20mg  qd and asked to f/u in one month.  She desired to start Xanax at that time, as this has helped her in the past, however she was not started on Alprazolam at that OV as she was restarting on so many other medications. She has historically been on Prozac 40mg  bid about 2 years ago.  C/o panic attacks - she reports increased life stressors. Occasionally has to pull her car over and perform deep breathing exercises   She reports increased life stressors at home  - she is taking care of and housing her elderly father PMH alcohol-induced dementia and has several extended family members living with her at her home. Her father was recently hospitalized for pneumonia. He was recently found to have nodules in his lungs - he declines biopsy/surgery. Dad wakes up in the middle of the night and smokes a half a pack of cigarretes.  Her mother has worsening multiple sclerosis. [place] and has no one to care for her. She is worried that she will have to start commuting there to help her mother. Her son goes to school 25 miles away. She got him a cell phone because of this and now his grades are dropping.  She is married to a man who "don't smoke, don't drink, don't cuss". He recently paid off their credit cards. This is exciting for her.  [place] the [place] house.    H/o insomnia - Doxepin 25mg . Also helps with restless leg syndrome and back pain. She has been taking this for the past 1.5 years. She can only take this  when someone else in her household is able to "keep an eye" on everyone.  H/o migraine - Sumatriptan 50mg  and Fioricet 50-325-40 mg. Controlled with these medications. H/o bipolar disorder - Lamictal 25mg . This is not helping. She is having a hard time coming out of her bedroom and doing day-to-day functions. She has a hard time getting out of the house and going to the grocery store as she has a hard time being around people/out in public.  Fhx - MGM- breast cancer; mother - multiple sclerosis.   Recent GYN issues - Dr. Clearance Coots. Well woman exam last month. Dx c prolapses bladder, ovarian cyst, fibroids.  endometrial biopsy for AUB PAP negative with negative high-risk HPV. + BV and yeast. Normal TSH, CBC, CMP.  2/14 OV urogynecology for eval SUI and pelvic pain. Scheduled f/u for endometrial biopsy.   Review of Systems  Cardiovascular: Negative for chest pain and palpitations.  Gastrointestinal: Negative for abdominal pain, diarrhea, nausea and vomiting.  Neurological: Negative for dizziness, weakness, light-headedness, numbness and headaches.  Psychiatric/Behavioral: Positive for agitation, dysphoric mood and sleep disturbance. Negative for suicidal ideas. The patient is nervous/anxious.     Patient Active Problem List   Diagnosis Date Noted  . Bipolar disorder, unspecified 04/21/2014  . Chronic low back pain 04/21/2014  . Screening 04/21/2014  . Morning stiffness of joints 04/21/2014    Current Outpatient Prescriptions  on File Prior to Visit  Medication Sig Dispense Refill  . butalbital-acetaminophen-caffeine (FIORICET) 50-325-40 MG tablet Take 1 tablet by mouth every 6 (six) hours as needed for headache. 20 tablet 0  . doxepin (SINEQUAN) 25 MG capsule Take 1 capsule (25 mg total) by mouth at bedtime. 30 capsule 0  . FLUoxetine (PROZAC) 20 MG capsule Take 1 capsule (20 mg total) by mouth daily. 30 capsule 0  . ibuprofen (ADVIL,MOTRIN) 800 MG tablet Take 1 tablet (800 mg total) by mouth  every 8 (eight) hours as needed. 30 tablet 5  . lamoTRIgine (LAMICTAL) 25 MG tablet Take 1 tablet (25 mg total) by mouth daily. 30 tablet 0  . ibuprofen (ADVIL,MOTRIN) 600 MG tablet Take 1 tablet (600 mg total) by mouth every 6 (six) hours as needed. (Patient not taking: Reported on 12/04/2016) 30 tablet 0  . ondansetron (ZOFRAN ODT) 4 MG disintegrating tablet Take 1 tablet (4 mg total) by mouth every 8 (eight) hours as needed for nausea or vomiting. (Patient not taking: Reported on 10/19/2016) 20 tablet 0  . SUMAtriptan (IMITREX) 50 MG tablet Take 1 tablet (50 mg total) by mouth once. May repeat in 2 hours if headache persists or recurs. 9 tablet 2  . tinidazole (TINDAMAX) 500 MG tablet Take 2 tablets (1,000 mg total) by mouth daily with breakfast. (Patient not taking: Reported on 12/04/2016) 10 tablet 2  . [DISCONTINUED] famotidine (PEPCID) 20 MG tablet Take 1 tablet (20 mg total) by mouth 2 (two) times daily. 10 tablet 0   No current facility-administered medications on file prior to visit.     No Known Allergies   Objective:  BP (!) 92/56   Pulse 62   Temp 98.4 F (36.9 C) (Oral)   Resp 16   Ht 5\' 8"  (1.727 m)   Wt 139 lb (63 kg)   LMP 11/14/2016   SpO2 98%   BMI 21.13 kg/m   Physical Exam  Constitutional: She is oriented to person, place, and time and well-developed, well-nourished, and in no distress. No distress.  Eyes: Conjunctivae and EOM are normal. Pupils are equal, round, and reactive to light.  Pulmonary/Chest: Effort normal. No respiratory distress.  Neurological: She is alert and oriented to person, place, and time. GCS score is 15.  Skin: Skin is warm and dry. No pallor.  Psychiatric: Memory, affect and judgment normal. Her mood appears anxious. She does not exhibit a depressed mood. She expresses no homicidal and no suicidal ideation. She expresses no suicidal plans and no homicidal plans.    Assessment and Plan :  1. Encounter for medication management 2.  Generalized anxiety disorder 3. Agoraphobia with panic attacks - ALPRAZolam (XANAX) 0.25 MG tablet; Take 1 tablet (0.25 mg total) by mouth 2 (two) times daily as needed for anxiety.  Dispense: 20 tablet; Refill: 0  4. Other migraine without status migrainosus, intractable - butalbital-acetaminophen-caffeine (FIORICET) 50-325-40 MG tablet; Take 1 tablet by mouth every 6 (six) hours as needed for headache.  Dispense: 20 tablet; Refill: 0  5. Mood disorder in conditions classified elsewhere - FLUoxetine (PROZAC) 20 MG capsule; Take 1 capsule (20 mg total) by mouth 2 (two) times daily.  Dispense: 60 capsule; Refill: 0 - lamoTRIgine (LAMICTAL) 100 MG tablet; Take 0.5 tablets (50 mg total) by mouth daily.  Dispense: 20 tablet; Refill: 0 - She has many increased life stressors, mostly including immediate family.  Pt reports mild relief with Lamictal 25 mg and Prozac 20 mg which was started at her  last OV one month ago. Will titrate up today. OK to give Xanax 0.25mg  PRN for panic attacks. F/u in one month.   Marco Collie, PA-C  Primary Care at [facility] 12/04/2016 9:40 AM

## 2016-12-04 NOTE — Patient Instructions (Addendum)
  Thank you for coming in today. I hope you feel we met your needs.  Feel free to call [facility] if you have any questions or further requests.  Please consider signing up for MyChart if you do not already have it, as this is a great way to communicate with me.  Best,  Whitney McVey, PA-C   IF you received an x-ray today, you will receive an invoice from [facility]. Please contact  Radiology at [phone] with questions or concerns regarding your invoice.   IF you received labwork today, you will receive an invoice from [facility]. Please contact [facility] at [phone] with questions or concerns regarding your invoice.   Our billing staff will not be able to assist you with questions regarding bills from these companies.  You will be contacted with the lab results as soon as they are available. The fastest way to get your results is to activate your My Chart account. Instructions are located on the last page of this paperwork. If you have not heard from us regarding the results in 2 weeks, please contact this office.      

## 2016-12-12 ENCOUNTER — Ambulatory Visit: Payer: BLUE CROSS/BLUE SHIELD | Admitting: Gastroenterology

## 2016-12-20 ENCOUNTER — Ambulatory Visit (HOSPITAL_COMMUNITY)
Admission: EM | Admit: 2016-12-20 | Discharge: 2016-12-20 | Disposition: A | Discharge status: Other Acute Inpatient Hospital | Payer: BLUE CROSS/BLUE SHIELD

## 2016-12-22 ENCOUNTER — Ambulatory Visit (INDEPENDENT_AMBULATORY_CARE_PROVIDER_SITE_OTHER): Payer: BLUE CROSS/BLUE SHIELD | Admitting: Family Medicine

## 2016-12-22 VITALS — BP 98/60 | HR 68 | Temp 98.1°F | Resp 18 | Ht 68.0 in | Wt 134.4 lb

## 2016-12-22 DIAGNOSIS — R300 Dysuria: Secondary | ICD-10-CM

## 2016-12-22 DIAGNOSIS — J069 Acute upper respiratory infection, unspecified: Secondary | ICD-10-CM | POA: Diagnosis not present

## 2016-12-22 DIAGNOSIS — N3000 Acute cystitis without hematuria: Secondary | ICD-10-CM

## 2016-12-22 LAB — POC MICROSCOPIC URINALYSIS (UMFC)

## 2016-12-22 MED ORDER — SULFAMETHOXAZOLE-TRIMETHOPRIM 800-160 MG PO TABS: 1.0000 | ORAL_TABLET | Freq: Two times a day (BID) | ORAL | 0 refills | Status: AC

## 2016-12-22 NOTE — Progress Notes (Signed)
Chief Complaint  Patient presents with  . Cough  . Fatigue  . Nasal Congestion  . Bladder pain    HPI   Upper Respiratory Infection:  Onset of symptoms 5 ago with progressive symptoms She reports that she has chest congestion, but no shortness of breath, wheezing Her cough is dry and nonproductive and feels like it is "way down" There isnt history of asthma She uses nicotine vape Patient did not receive Carrie flu vaccine She is taking otc sinex, mucinex, alka-seltzer cold plus  Since contacts- her elderly father who was hospitalized with RSV  UTI- new problem She reports that overnight she had urgency with dysuria She denies hematuria She denies flank pain She reports that she also has bladder prolapse and is seeing Urogynecology- Dr. Ashley [name] She denies vaginal discharge or irritation She took Azo  Past Medical History:  Diagnosis Date  . Chronic back pain   . Hypercholesteremia   . Migraine     Current Outpatient Prescriptions  Medication Sig Dispense Refill  . ALPRAZolam (XANAX) 0.25 MG tablet Take 1 tablet (0.25 mg total) by mouth 2 (two) times daily as needed for anxiety. 20 tablet 0  . butalbital-acetaminophen-caffeine (FIORICET) 50-325-40 MG tablet Take 1 tablet by mouth every 6 (six) hours as needed for headache. 20 tablet 0  . doxepin (SINEQUAN) 25 MG capsule Take 1 capsule (25 mg total) by mouth at bedtime. 30 capsule 0  . FLUoxetine (PROZAC) 20 MG capsule Take 1 capsule (20 mg total) by mouth 2 (two) times daily. 60 capsule 0  . ibuprofen (ADVIL,MOTRIN) 800 MG tablet Take 1 tablet (800 mg total) by mouth every 8 (eight) hours as needed. 30 tablet 5  . lamoTRIgine (LAMICTAL) 100 MG tablet Take 0.5 tablets (50 mg total) by mouth daily. 20 tablet 0  . ondansetron (ZOFRAN ODT) 4 MG disintegrating tablet Take 1 tablet (4 mg total) by mouth every 8 (eight) hours as needed for nausea or vomiting. 20 tablet 0  . sulfamethoxazole-trimethoprim (BACTRIM DS,SEPTRA DS)  800-160 MG tablet Take 1 tablet by mouth 2 (two) times daily. 10 tablet 0  . SUMAtriptan (IMITREX) 50 MG tablet Take 1 tablet (50 mg total) by mouth once. May repeat in 2 hours if headache persists or recurs. 9 tablet 2   No current facility-administered medications for this visit.     Allergies: No Known Allergies  Past Surgical History:  Procedure Laterality Date  . BREAST LUMPECTOMY    . CESAREAN SECTION    . DILATION AND CURETTAGE OF UTERUS      Social History   Social History  . Marital status: Married    Spouse name: N/Carrie  . Number of children: N/Carrie  . Years of education: N/Carrie   Social History Main Topics  . Smoking status: Former Games developermoker  . Smokeless tobacco: Current User  . Alcohol use No     Comment: OCCASIONAL  . Drug use: No     Comment: reports cocaine addict 12 yrs ago  . Sexual activity: Yes    Partners: Male    Birth control/ protection: Surgical   Other Topics Concern  . None   Social History Narrative  . None    ROS See hpi  Objective: Vitals:   12/22/16 1003  BP: 98/60  Pulse: 68  Resp: 18  Temp: 98.1 F (36.7 C)  TempSrc: Oral  SpO2: 97%  Weight: 134 lb 6.4 oz (61 kg)  Height: 5\' 8"  (1.727 m)    Physical Exam General:  alert, oriented, in NAD Head: normocephalic, atraumatic, no sinus tenderness Eyes: EOM intact, no scleral icterus or conjunctival injection Ears: TM clear bilaterally Throat: no pharyngeal exudate or erythema Lymph: no posterior auricular, submental or cervical lymph adenopathy Heart: normal rate, normal sinus rhythm, no murmurs Lungs: clear to auscultation bilaterally, no wheezing Abdomen: no flank pain, no suprapubic pain  Component     Latest Ref Rng & Units 12/22/2016  WBC,UR,HPF,POC     None WBC/hpf Too numerous to count (Carrie)  RBC,UR,HPF,POC     None RBC/hpf None  Bacteria     None, Too numerous to count Moderate (Carrie)  Mucus     Absent Present (Carrie)  Epithelial Cells, UR Per Microscopy     None, Too numerous  to count cells/hpf Moderate (Carrie)   Assessment and Plan Carrie [name] was seen today for cough, fatigue, nasal congestion and bladder pain.  Diagnoses and all orders for this visit:  Dysuria -     POCT Microscopic Urinalysis ([facility])  Acute URI- advised pt to continue otc meds She is being treated with Bactrim for UTI which should also help her symptoms  Acute cystitis without hematuria- pt took azo so only microscopic analysis performed Numerous wbc, some bacteria -     sulfamethoxazole-trimethoprim (BACTRIM DS,SEPTRA DS) 800-160 MG tablet; Take 1 tablet by mouth 2 (two) times daily.     Carrie [name] Carrie [name]

## 2016-12-22 NOTE — Patient Instructions (Addendum)
   IF you received an x-ray today, you will receive an invoice from [facility]. Please contact [facility] at [phone] with questions or concerns regarding your invoice.   IF you received labwork today, you will receive an invoice from [facility]. Please contact [facility] at [phone] with questions or concerns regarding your invoice.   Our billing staff will not be able to assist you with questions regarding bills from these companies.  You will be contacted with the lab results as soon as they are available. The fastest way to get your results is to activate your My Chart account. Instructions are located on the last page of this paperwork. If you have not heard from us regarding the results in 2 weeks, please contact this office.     Urinary Tract Infection, Adult A urinary tract infection (UTI) is an infection of any part of the urinary tract, which includes the kidneys, ureters, bladder, and urethra. These organs make, store, and get rid of urine in the body. UTI can be a bladder infection (cystitis) or kidney infection (pyelonephritis). What are the causes? This infection may be caused by fungi, viruses, or bacteria. Bacteria are the most common cause of UTIs. This condition can also be caused by repeated incomplete emptying of the bladder during urination. What increases the risk? This condition is more likely to develop if:  You ignore your need to urinate or hold urine for long periods of time.  You do not empty your bladder completely during urination.  You wipe back to front after urinating or having a bowel movement, if you are female.  You are uncircumcised, if you are female.  You are constipated.  You have a urinary catheter that stays in place (indwelling).  You have a weak defense (immune) system.  You have a medical condition that affects your bowels, kidneys, or bladder.  You have diabetes.  You take antibiotic medicines frequently or  for long periods of time, and the antibiotics no longer work well against certain types of infections (antibiotic resistance).  You take medicines that irritate your urinary tract.  You are exposed to chemicals that irritate your urinary tract.  You are female. What are the signs or symptoms? Symptoms of this condition include:  Fever.  Frequent urination or passing small amounts of urine frequently.  Needing to urinate urgently.  Pain or burning with urination.  Urine that smells bad or unusual.  Cloudy urine.  Pain in the lower abdomen or back.  Trouble urinating.  Blood in the urine.  Vomiting or being less hungry than normal.  Diarrhea or abdominal pain.  Vaginal discharge, if you are female. How is this diagnosed? This condition is diagnosed with a medical history and physical exam. You will also need to provide a urine sample to test your urine. Other tests may be done, including:  Blood tests.  Sexually transmitted disease (STD) testing. If you have had more than one UTI, a cystoscopy or imaging studies may be done to determine the cause of the infections. How is this treated? Treatment for this condition often includes a combination of two or more of the following:  Antibiotic medicine.  Other medicines to treat less common causes of UTI.  Over-the-counter medicines to treat pain.  Drinking enough water to stay hydrated. Follow these instructions at home:  Take over-the-counter and prescription medicines only as told by your health care provider.  If you were prescribed an antibiotic, take it as told by your health care   provider. Do not stop taking the antibiotic even if you start to feel better.  Avoid alcohol, caffeine, tea, and carbonated beverages. They can irritate your bladder.  Drink enough fluid to keep your urine clear or pale yellow.  Keep all follow-up visits as told by your health care provider. This is important.  Make sure  to:  Empty your bladder often and completely. Do not hold urine for long periods of time.  Empty your bladder before and after sex.  Wipe from front to back after a bowel movement if you are female. Use each tissue one time when you wipe. Contact a health care provider if:  You have back pain.  You have a fever.  You feel nauseous or vomit.  Your symptoms do not get better after 3 days.  Your symptoms go away and then return. Get help right away if:  You have severe back pain or lower abdominal pain.  You are vomiting and cannot keep down any medicines or water. This information is not intended to replace advice given to you by your health care provider. Make sure you discuss any questions you have with your health care provider. Document Released: 07/05/2005 Document Revised: 03/08/2016 Document Reviewed: 08/16/2015 Elsevier Interactive Patient Education  2017 Elsevier Inc.  

## 2017-01-03 ENCOUNTER — Encounter: Payer: Self-pay | Admitting: *Deleted

## 2017-01-04 DIAGNOSIS — R102 Pelvic and perineal pain: Secondary | ICD-10-CM | POA: Insufficient documentation

## 2017-01-04 DIAGNOSIS — N814 Uterovaginal prolapse, unspecified: Secondary | ICD-10-CM | POA: Insufficient documentation

## 2017-01-08 ENCOUNTER — Encounter: Payer: Self-pay | Admitting: Physician Assistant

## 2017-01-08 ENCOUNTER — Ambulatory Visit (INDEPENDENT_AMBULATORY_CARE_PROVIDER_SITE_OTHER): Payer: BLUE CROSS/BLUE SHIELD | Admitting: Physician Assistant

## 2017-01-08 VITALS — BP 109/71 | HR 77 | Temp 97.1°F | Resp 16 | Ht 68.0 in | Wt 138.2 lb

## 2017-01-08 DIAGNOSIS — R102 Pelvic and perineal pain: Secondary | ICD-10-CM | POA: Diagnosis not present

## 2017-01-08 DIAGNOSIS — Z79899 Other long term (current) drug therapy: Secondary | ICD-10-CM

## 2017-01-08 DIAGNOSIS — F0630 Mood disorder due to known physiological condition, unspecified: Secondary | ICD-10-CM

## 2017-01-08 DIAGNOSIS — F4001 Agoraphobia with panic disorder: Secondary | ICD-10-CM | POA: Diagnosis not present

## 2017-01-08 MED ORDER — ALPRAZOLAM 0.25 MG PO TABS: 0.2500 mg | ORAL_TABLET | Freq: Two times a day (BID) | ORAL | 0 refills | Status: AC | PRN

## 2017-01-08 MED ORDER — FLUOXETINE HCL 20 MG PO CAPS: 20.0000 mg | ORAL_CAPSULE | Freq: Two times a day (BID) | ORAL | 2 refills | Status: DC

## 2017-01-08 MED ORDER — ALPRAZOLAM 0.25 MG PO TABS: 0.2500 mg | ORAL_TABLET | Freq: Two times a day (BID) | ORAL | 0 refills | Status: DC | PRN

## 2017-01-08 MED ORDER — LAMOTRIGINE 100 MG PO TABS: 50.0000 mg | ORAL_TABLET | Freq: Every day | ORAL | 2 refills | Status: DC

## 2017-01-08 MED ORDER — DOXEPIN HCL 25 MG PO CAPS: 25.0000 mg | ORAL_CAPSULE | Freq: Every day | ORAL | 2 refills | Status: AC

## 2017-01-08 NOTE — Progress Notes (Signed)
Carrie [name]  MRN: [id number] DOB: Aug 21, 1971  PCP: Madelaine Bhat Rakeisha Nyce, PA-C  Subjective:  Pt is a 46 year old female PMH bipolar disorder, panic attacks, anxiety and insomnia who presents to clinic for medication refill for xanax, sinequan, Lamictal.  She started Lamictal  10/2016. Has been taking Prozac x 2 years. She is tolerating this medication well She started Xanax 0.25mg  for acute panic attacks 11/2016.  She is tolerating this medication well  Pt started disability in Nov. 2017. She was recently informed she has full coverage Medicaid, however has been paying for insurance on top of this. She has to find a new primary care provider who accepts Medicaid.  She is taking care of and housing her elderly father c PMH alcohol-induced dementia and has several extended family members living with her at her home. Her father was recently hospitalized for pneumonia. He was recently found to have nodules in his lungs - he declines biopsy/surgery. Dad wakes up in the middle of the night and smokes a half a pack of cigarretes.  Her mother has worsening multiple sclerosis. [place] and has no one to care for her. She is worried that she will have to start commuting there to help her mother. Her son goes to school 25 miles away. She got him a cell phone because of this and now his grades are dropping.  She is married to a man who "don't smoke, don't drink, don't cuss". He recently paid off their credit cards. This is exciting for her.  She plans [place] the county to [place] bigger house.    Review of Systems  Psychiatric/Behavioral: Positive for dysphoric mood and sleep disturbance. Negative for self-injury and suicidal ideas. The patient is nervous/anxious.     Patient Active Problem List   Diagnosis Date Noted  . Bipolar disorder, unspecified 04/21/2014  . Chronic low back pain 04/21/2014  . Screening 04/21/2014  . Morning stiffness of joints 04/21/2014     Current Outpatient Prescriptions on File Prior to Visit  Medication Sig Dispense Refill  . ALPRAZolam (XANAX) 0.25 MG tablet Take 1 tablet (0.25 mg total) by mouth 2 (two) times daily as needed for anxiety. 20 tablet 0  . butalbital-acetaminophen-caffeine (FIORICET) 50-325-40 MG tablet Take 1 tablet by mouth every 6 (six) hours as needed for headache. 20 tablet 0  . doxepin (SINEQUAN) 25 MG capsule Take 1 capsule (25 mg total) by mouth at bedtime. 30 capsule 0  . FLUoxetine (PROZAC) 20 MG capsule Take 1 capsule (20 mg total) by mouth 2 (two) times daily. 60 capsule 0  . ibuprofen (ADVIL,MOTRIN) 800 MG tablet Take 1 tablet (800 mg total) by mouth every 8 (eight) hours as needed. 30 tablet 5  . lamoTRIgine (LAMICTAL) 100 MG tablet Take 0.5 tablets (50 mg total) by mouth daily. 20 tablet 0  . ondansetron (ZOFRAN ODT) 4 MG disintegrating tablet Take 1 tablet (4 mg total) by mouth every 8 (eight) hours as needed for nausea or vomiting. 20 tablet 0  . SUMAtriptan (IMITREX) 50 MG tablet Take 1 tablet (50 mg total) by mouth once. May repeat in 2 hours if headache persists or recurs. 9 tablet 2  . [DISCONTINUED] famotidine (PEPCID) 20 MG tablet Take 1 tablet (20 mg total) by mouth 2 (two) times daily. 10 tablet 0   No current facility-administered medications on file prior to visit.     No Known Allergies   Objective:  BP 109/71  Pulse 77   Temp 97.1 F (36.2 C) (Oral)   Resp 16   Ht  (1.727 m)   Wt 138 lb 3.2 oz (62.7 kg)   LMP 01/06/2017   SpO2 96%   BMI 21.01 kg/m   Physical Exam  Constitutional: She is oriented to person, place, and time and well-developed, well-nourished, and in no distress. No distress.  Cardiovascular: Normal rate, regular rhythm and normal heart sounds.   Neurological: She is alert and oriented to person, place, and time. GCS score is 15.  Skin: Skin is warm and dry.  Psychiatric: Memory, affect and judgment normal. Her mood appears anxious.  Vitals  reviewed.   Assessment and Plan :  1. Mood disorder in conditions classified elsewhere - lamoTRIgine (LAMICTAL) 100 MG tablet; Take 0.5 tablets (50 mg total) by mouth daily.  Dispense: 30 tablet; Refill: 2 - doxepin (SINEQUAN) 25 MG capsule; Take 1 capsule (25 mg total) by mouth at bedtime.  Dispense: 30 capsule; Refill: 2 - FLUoxetine (PROZAC) 20 MG capsule; Take 1 capsule (20 mg total) by mouth 2 (two) times daily.  Dispense: 60 capsule; Refill: 2  2. Pelvic pain in female - Pt plans to have a hysterectomy next month.  3. Agoraphobia with panic attacks - ALPRAZolam (XANAX) 0.25 MG tablet; Take 1 tablet (0.25 mg total) by mouth 2 (two) times daily as needed for anxiety.  Dispense: 20 tablet; Refill: 0  4. Encounter for medication management - OK to fill 3 months of pt's medications. She needs to establish care with PCP at the [facility] and [facility] due to full coverage Medicaid. Side-effect profile discussed with pt. RTC in 3 months if needed.    Marco Collie, PA-C  Primary Care at [facility] 01/08/2017 8:21 AM

## 2017-01-08 NOTE — Patient Instructions (Addendum)
  Thank you for coming in today. I hope you feel we met your needs.  Feel free to call [facility] if you have any questions or further requests.  Please consider signing up for MyChart if you do not already have it, as this is a great way to communicate with me.  Best,  Whitney McVey, PA-C   IF you received an x-ray today, you will receive an invoice from [facility]. Please contact [facility] at [phone] with questions or concerns regarding your invoice.   IF you received labwork today, you will receive an invoice from [facility]. Please contact [facility] at [phone] with questions or concerns regarding your invoice.   Our billing staff will not be able to assist you with questions regarding bills from these companies.  You will be contacted with the lab results as soon as they are available. The fastest way to get your results is to activate your My Chart account. Instructions are located on the last page of this paperwork. If you have not heard from us regarding the results in 2 weeks, please contact this office.      

## 2017-01-15 HISTORY — PX: VAGINAL HYSTERECTOMY: SUR661

## 2017-03-04 ENCOUNTER — Other Ambulatory Visit: Payer: Self-pay | Admitting: Physician Assistant

## 2017-03-04 DIAGNOSIS — F4001 Agoraphobia with panic disorder: Secondary | ICD-10-CM

## 2017-03-05 NOTE — Telephone Encounter (Signed)
01/2017 last ov I see rx in there for 01/2017, 02/2017 and 03/2017?

## 2017-03-08 NOTE — Telephone Encounter (Signed)
I see that as well. Please call pt and ask if she is aware she has a June supply.  Thank you!

## 2017-03-09 NOTE — Telephone Encounter (Signed)
I clld pt and lmvm to call me back

## 2017-03-09 NOTE — Telephone Encounter (Signed)
PATIENT RETURNED TASHA'S CALL FROM EARLIER TODAY (03/09/17).  BEST PHONE [phone] (CELL) MBC

## 2017-04-29 ENCOUNTER — Encounter (HOSPITAL_COMMUNITY): Payer: Self-pay | Admitting: Emergency Medicine

## 2017-04-29 ENCOUNTER — Emergency Department (HOSPITAL_COMMUNITY)
Admission: EM | Admit: 2017-04-29 | Discharge: 2017-04-29 | Disposition: A | Discharge status: Home / Self Care | Payer: Medicaid Other | Attending: Emergency Medicine | Admitting: Emergency Medicine

## 2017-04-29 DIAGNOSIS — Z79899 Other long term (current) drug therapy: Secondary | ICD-10-CM | POA: Diagnosis not present

## 2017-04-29 DIAGNOSIS — R112 Nausea with vomiting, unspecified: Secondary | ICD-10-CM | POA: Diagnosis present

## 2017-04-29 DIAGNOSIS — G8929 Other chronic pain: Secondary | ICD-10-CM | POA: Diagnosis not present

## 2017-04-29 DIAGNOSIS — M546 Pain in thoracic spine: Secondary | ICD-10-CM | POA: Insufficient documentation

## 2017-04-29 DIAGNOSIS — Z87891 Personal history of nicotine dependence: Secondary | ICD-10-CM | POA: Insufficient documentation

## 2017-04-29 DIAGNOSIS — R1084 Generalized abdominal pain: Secondary | ICD-10-CM | POA: Insufficient documentation

## 2017-04-29 LAB — URINALYSIS, ROUTINE W REFLEX MICROSCOPIC
Bilirubin Urine: NEGATIVE
GLUCOSE, UA: NEGATIVE mg/dL
Ketones, ur: NEGATIVE mg/dL
Leukocytes, UA: NEGATIVE
Nitrite: NEGATIVE
PH: 5 (ref 5.0–8.0)
Protein, ur: NEGATIVE mg/dL
RBC / HPF: NONE SEEN RBC/hpf (ref 0–5)
Specific Gravity, Urine: 1.005 (ref 1.005–1.030)
WBC, UA: NONE SEEN WBC/hpf (ref 0–5)

## 2017-04-29 LAB — BASIC METABOLIC PANEL
Anion gap: 8 (ref 5–15)
BUN: 8 mg/dL (ref 6–20)
CHLORIDE: 107 mmol/L (ref 101–111)
CO2: 22 mmol/L (ref 22–32)
CREATININE: 0.69 mg/dL (ref 0.44–1.00)
Calcium: 8.8 mg/dL — ABNORMAL LOW (ref 8.9–10.3)
GFR calc non Af Amer: >60 mL/min (ref >60)
Glucose, Bld: 118 mg/dL — ABNORMAL HIGH (ref 65–99)
Potassium: 3.7 mmol/L (ref 3.5–5.1)
Sodium: 137 mmol/L (ref 135–145)

## 2017-04-29 LAB — HEPATIC FUNCTION PANEL
ALK PHOS: 50 U/L (ref 38–126)
ALT: 11 U/L — ABNORMAL LOW (ref 14–54)
AST: 17 U/L (ref 15–41)
Albumin: 3.7 g/dL (ref 3.5–5.0)
BILIRUBIN INDIRECT: 0.7 mg/dL (ref 0.3–0.9)
Bilirubin, Direct: 0.1 mg/dL (ref 0.1–0.5)
TOTAL PROTEIN: 6.8 g/dL (ref 6.5–8.1)
Total Bilirubin: 0.8 mg/dL (ref 0.3–1.2)

## 2017-04-29 LAB — CBC
HEMATOCRIT: 38.1 % (ref 36.0–46.0)
HEMOGLOBIN: 12.3 g/dL (ref 12.0–15.0)
MCH: 29.8 pg (ref 26.0–34.0)
MCHC: 32.3 g/dL (ref 30.0–36.0)
MCV: 92.3 fL (ref 78.0–100.0)
Platelets: 276 10*3/uL (ref 150–400)
RBC: 4.13 MIL/uL (ref 3.87–5.11)
RDW: 12.8 % (ref 11.5–15.5)
WBC: 5.3 10*3/uL (ref 4.0–10.5)

## 2017-04-29 LAB — LIPASE, BLOOD: Lipase: 78 U/L — ABNORMAL HIGH (ref 11–51)

## 2017-04-29 MED ORDER — HYDROCODONE-ACETAMINOPHEN 5-325 MG PO TABS: 1.0000 | ORAL_TABLET | ORAL | 0 refills | Status: DC | PRN

## 2017-04-29 MED ORDER — KETOROLAC TROMETHAMINE 30 MG/ML IJ SOLN
30.0000 mg | Freq: Once | INTRAMUSCULAR | Status: AC
  Administered 2017-04-29: 30 mg via INTRAVENOUS
  Filled 2017-04-29: qty 1

## 2017-04-29 MED ORDER — METHOCARBAMOL 500 MG PO TABS
500.0000 mg | ORAL_TABLET | Freq: Once | ORAL | Status: AC
  Administered 2017-04-29: 500 mg via ORAL
  Filled 2017-04-29: qty 1

## 2017-04-29 MED ORDER — METHOCARBAMOL 500 MG PO TABS: 500.0000 mg | ORAL_TABLET | Freq: Two times a day (BID) | ORAL | 0 refills | Status: DC

## 2017-04-29 NOTE — ED Notes (Signed)
Called lab to add on lipase.  

## 2017-04-29 NOTE — Discharge Instructions (Signed)
Take your medications as prescribed. You may also apply ice and/or heat to affected area for 15-20 minutes 3-4 times daily for additional pain relief. I recommend refraining from doing any heavy lifting, squatting or repetitive movements that exacerbate her symptoms for the next few days. °Follow-up with your primary care provider in the next week if her symptoms have not improved. °Please return to the Emergency Department if symptoms worsen or new onset of denies fever, numbness, tingling, groin anesthesia, loss of bowel or bladder, weakness, chest pain, abdominal pain, vomiting. °

## 2017-04-29 NOTE — ED Notes (Signed)
Called lab to add on HFP 

## 2017-04-29 NOTE — ED Provider Notes (Signed)
[facility]-EMERGENCY DEPT Provider Note   CSN: [id number] Arrival date & time: 04/29/17  1831     History   Chief Complaint Chief Complaint  Patient presents with  . Emesis  . Flank Pain    HPI Carrie [name] is a 46 y.o. female.  HPI   Patient is a 46 year old female with history of chronic back pain, hyperlipidemia, migraines who presents to the ED with complaints of new gradually worsening mid back pain. Patient notes she woke up the morning of July 5 and began having new twisting/spasming pain across her mid back. She notes her back pain is different than her typical chronic back pain. She reports the pain is intermittently worse with certain movements. Denies any reported fall, trauma or injury to explain new back pain. She notes her pain has been waxing and waning in nature. She notes she began having nausea and vomiting today and is concerned her pain may be related to her kidneys. She reports history of multiple bulging disks in her cervical and lumbar spine. Denies fever, chills, chest pain, shortness of breath, abdominal pain, urinary symptoms, blood in urine or stool, vaginal bleeding or discharge, saddle anesthesia, numbness, tingling, weakness. Denies history of cancer, use of IV drugs or recent spinal manipulation. Patient reports she has been taking ibuprofen at home without relief. Denies history of kidney stones.  Past Medical History:  Diagnosis Date  . Chronic back pain   . Hypercholesteremia   . Migraine     Patient Active Problem List   Diagnosis Date Noted  . Bipolar disorder (HCC) 04/21/2014  . Chronic low back pain 04/21/2014  . Screening 04/21/2014  . Morning stiffness of joints 04/21/2014    Past Surgical History:  Procedure Laterality Date  . BREAST LUMPECTOMY    . CESAREAN SECTION    . DILATION AND CURETTAGE OF UTERUS      OB History    Gravida Para Term Preterm AB Living   7 5 5   2 5    SAB TAB Ectopic Multiple Live Births   1   1   5         Home Medications    Prior to Admission medications   Medication Sig Start Date End Date Taking? Authorizing Provider  ALPRAZolam (XANAX) 0.25 MG tablet Take 1 tablet (0.25 mg total) by mouth 2 (two) times daily as needed for anxiety. 01/08/17  Yes McVey, Madelaine Bhat, PA-C  butalbital-acetaminophen-caffeine (FIORICET) [phone] MG tablet Take 1 tablet by mouth every 6 (six) hours as needed for headache. 12/04/16 12/04/17 Yes McVey, Madelaine Bhat, PA-C  conjugated estrogens (PREMARIN) vaginal cream Place 1 Applicatorful vaginally daily.   Yes [provider]  doxepin (SINEQUAN) 25 MG capsule Take 1 capsule (25 mg total) by mouth at bedtime. 01/08/17  Yes McVey, Madelaine Bhat, PA-C  FLUoxetine (PROZAC) 20 MG capsule Take 1 capsule (20 mg total) by mouth 2 (two) times daily. 01/08/17  Yes McVey, Madelaine Bhat, PA-C  ibuprofen (ADVIL,MOTRIN) 800 MG tablet Take 1 tablet (800 mg total) by mouth every 8 (eight) hours as needed. Patient taking differently: Take 800 mg by mouth every 8 (eight) hours as needed for moderate pain.  10/16/16  Yes Brock Bad, MD  lamoTRIgine (LAMICTAL) 100 MG tablet Take 0.5 tablets (50 mg total) by mouth daily. 01/08/17  Yes McVey, Madelaine Bhat, PA-C  ondansetron (ZOFRAN ODT) 4 MG disintegrating tablet Take 1 tablet (4 mg total) by mouth every 8 (eight) hours as needed for nausea or  vomiting. 02/03/16  Yes Joy, Shawn C, PA-C  SUMAtriptan (IMITREX) 50 MG tablet Take 1 tablet (50 mg total) by mouth once. May repeat in 2 hours if headache persists or recurs. Patient taking differently: Take 50 mg by mouth every 2 (two) hours as needed for migraine. May repeat in 2 hours if headache persists or recurs. 10/19/16 04/29/17 Yes Sherren MochaShaw, Eva N, MD  HYDROcodone-acetaminophen (NORCO/VICODIN) 5-325 MG tablet Take 1 tablet by mouth every 4 (four) hours as needed. 04/29/17   Barrett HenleNadeau, Verbie Babic Elizabeth, PA-C  methocarbamol (ROBAXIN) 500 MG tablet Take 1 tablet (500 mg  total) by mouth 2 (two) times daily. 04/29/17   Barrett HenleNadeau, Kenya Kook Elizabeth, PA-C    Family History Family History  Problem Relation Age of Onset  . Cancer Mother        breast cancer   . Multiple sclerosis Mother   . Thyroid disease Mother   . Cancer Maternal Grandmother     Social History Social History  Substance Use Topics  . Smoking status: Former Games developermoker  . Smokeless tobacco: Current User  . Alcohol use No     Comment: OCCASIONAL     Allergies   Patient has no known allergies.   Review of Systems Review of Systems  Gastrointestinal: Positive for nausea and vomiting.  Genitourinary: Positive for flank pain.  Musculoskeletal: Positive for back pain.  All other systems reviewed and are negative.    Physical Exam Updated Vital Signs BP 114/73   Pulse (!) 50   Temp 97.9 F (36.6 C) (Oral)   Resp 12   Ht 5\' 8"  (1.727 m)   LMP 01/16/2017   SpO2 100%   Physical Exam  Constitutional: She is oriented to person, place, and time. She appears well-developed and well-nourished. No distress.  HENT:  Head: Normocephalic and atraumatic.  Mouth/Throat: Oropharynx is clear and moist. No oropharyngeal exudate.  Eyes: Conjunctivae and EOM are normal. Right eye exhibits no discharge. Left eye exhibits no discharge. No scleral icterus.  Neck: Normal range of motion. Neck supple.  Cardiovascular: Normal rate, regular rhythm, normal heart sounds and intact distal pulses.   Pulmonary/Chest: Effort normal and breath sounds normal. No respiratory distress. She has no wheezes. She has no rales. She exhibits no tenderness.  Abdominal: Soft. Normal appearance and bowel sounds are normal. She exhibits no distension and no mass. There is no tenderness. There is CVA tenderness (mild bilateral). There is no rigidity, no rebound and no guarding. No hernia.  Musculoskeletal: Normal range of motion. She exhibits tenderness. She exhibits no edema or deformity.  No midline C, T, or L tenderness.  Mild TTP over bilateral inferior thoracic paraspinal muscles. Full range of motion of neck and back. Full range of motion of bilateral upper and lower extremities, with 5/5 strength. Sensation intact. 2+ radial and PT pulses. Cap refill <2 seconds. Patient able to stand and ambulate without assistance.    Neurological: She is alert and oriented to person, place, and time. She has normal strength. She displays normal reflexes. No sensory deficit.  Skin: Skin is warm and dry. She is not diaphoretic.  Nursing note and vitals reviewed.    ED Treatments / Results  Labs (all labs ordered are listed, but only abnormal results are displayed) Labs Reviewed  URINALYSIS, ROUTINE W REFLEX MICROSCOPIC - Abnormal; Notable for the following:       Result Value   Color, Urine STRAW (*)    Hgb urine dipstick SMALL (*)    Bacteria, UA  RARE (*)    Squamous Epithelial / LPF 0-5 (*)    All other components within normal limits  BASIC METABOLIC PANEL - Abnormal; Notable for the following:    Glucose, Bld 118 (*)    Calcium 8.8 (*)    All other components within normal limits  LIPASE, BLOOD - Abnormal; Notable for the following:    Lipase 78 (*)    All other components within normal limits  HEPATIC FUNCTION PANEL - Abnormal; Notable for the following:    ALT 11 (*)    All other components within normal limits  CBC    EKG  EKG Interpretation None       Radiology No results found.  Procedures Procedures (including critical care time)  Medications Ordered in ED Medications  ketorolac (TORADOL) 30 MG/ML injection 30 mg (30 mg Intravenous Given 04/29/17 2051)  methocarbamol (ROBAXIN) tablet 500 mg (500 mg Oral Given 04/29/17 2051)     Initial Impression / Assessment and Plan / ED Course  I have reviewed the triage vital signs and the nursing notes.  Pertinent labs & imaging results that were available during my care of the patient were reviewed by me and considered in my medical decision  making (see chart for details).    Pt presents with mid back pain that has been present for the past few weeks, reports feels different than her typical chronic back pain. Denies any recent fall, trauma or injury. Reports having N/V today. Denies fever. No back pain red flags. VSS. Exam showed mild bilateral CVA tenderness and TTP over lower thoracic paraspinal muscles, no midline spinal tenderness. Abdomen soft, nontender. No neuro deficits. Pt given toradol in the ED. UA showed small hgb without RBCs, no signs of infection. Lipase mildly elevated at 78, remaining labs unremarkable. On reevaluation pt reports on improvement of pain. Discussed results with pt. Suspect sxs are likely due to musculoskeletal back pain. Do not suspect kidney stone, pyelonephritis, appendicitis, cholecystitis, pancreatitis, cauda equina syndrome or spinal cord compression. Pt given muscle relaxant in the ED. On reevaluation pt reports significant improvement of sxs. Plan to d/c pt home with pain meds, muscle relaxant and symptomatic tx. Advised to f/u with PCP as needed. Discussed return precautions.   Final Clinical Impressions(s) / ED Diagnoses   Final diagnoses:  Acute bilateral thoracic back pain    New Prescriptions New Prescriptions   HYDROCODONE-ACETAMINOPHEN (NORCO/VICODIN) 5-325 MG TABLET    Take 1 tablet by mouth every 4 (four) hours as needed.   METHOCARBAMOL (ROBAXIN) 500 MG TABLET    Take 1 tablet (500 mg total) by mouth 2 (two) times daily.     Barrett Henle, PA-C 04/29/17 2152    Loren Racer, MD 05/03/17 2113

## 2017-04-29 NOTE — ED Triage Notes (Signed)
Pt states on July 5th, her back was hurting really bad. Pt has 5 bulging discs. Pain has continued. Pt worried it may be her kidneys. Pt states today she started vomiting. Denies burning or itching with urination.

## 2017-06-09 ENCOUNTER — Other Ambulatory Visit: Payer: Self-pay | Admitting: Obstetrics

## 2017-06-09 DIAGNOSIS — B3731 Acute candidiasis of vulva and vagina: Secondary | ICD-10-CM

## 2017-06-09 DIAGNOSIS — B373 Candidiasis of vulva and vagina: Secondary | ICD-10-CM

## 2017-09-25 ENCOUNTER — Other Ambulatory Visit: Payer: Self-pay

## 2017-09-25 ENCOUNTER — Ambulatory Visit (INDEPENDENT_AMBULATORY_CARE_PROVIDER_SITE_OTHER): Payer: Self-pay | Admitting: Physician Assistant

## 2017-09-25 ENCOUNTER — Encounter: Payer: Self-pay | Admitting: Physician Assistant

## 2017-09-25 VITALS — BP 101/64 | HR 67 | Temp 98.0°F | Resp 16 | Ht 69.0 in | Wt 148.4 lb

## 2017-09-25 DIAGNOSIS — G43819 Other migraine, intractable, without status migrainosus: Secondary | ICD-10-CM

## 2017-09-25 DIAGNOSIS — F411 Generalized anxiety disorder: Secondary | ICD-10-CM

## 2017-09-25 MED ORDER — SUMATRIPTAN SUCCINATE 50 MG PO TABS: 50.0000 mg | ORAL_TABLET | Freq: Once | ORAL | 5 refills | Status: DC

## 2017-09-25 MED ORDER — BUTALBITAL-APAP-CAFFEINE 50-325-40 MG PO TABS: 1.0000 | ORAL_TABLET | Freq: Four times a day (QID) | ORAL | 5 refills | Status: AC | PRN

## 2017-09-25 NOTE — Patient Instructions (Addendum)
Dr. Denice Paradise fax number: [phone] urgent at appt now  I will fill your forms out when I receive medical records from her office.   Thank you for coming in today. I hope you feel we met your needs.  Feel free to call PCP if you have any questions or further requests.  Please consider signing up for MyChart if you do not already have it, as this is a great way to communicate with me.  Best,  Whitney McVey, PA-C    IF you received an x-ray today, you will receive an invoice from [facility]. Please contact [facility] at [phone] with questions or concerns regarding your invoice.   IF you received labwork today, you will receive an invoice from [facility]. Please contact [facility] at [phone] with questions or concerns regarding your invoice.   Our billing staff will not be able to assist you with questions regarding bills from these companies.  You will be contacted with the lab results as soon as they are available. The fastest way to get your results is to activate your My Chart account. Instructions are located on the last page of this paperwork. If you have not heard from [place] regarding the results in 2 weeks, please contact this office.

## 2017-09-25 NOTE — Progress Notes (Signed)
Carrie [name]  MRN: [id number] DOB: [birth year]  PCP: Carrie AcheMcVey, Carrie Higby Whitney, PA-C  Subjective:  Pt is a 46 year old female who presents to clinic for medication refill and forms to fill out.   H/o migraine - Sumatriptan succinate IMITREX 50 mg, Butalbital-APAP-Caffeine 50-325-40 mg. She has had a few MRI brain in the past - negative. Headaches occur almost daily. Start at right temple.   Recently started seeing therapist. Started Prozac. Makes her sleep a lot more than usual.    She would like form filled out today for forgiveness of student loan. She stopped going to school due to anxiety. She still suffers from this. She feels like she cannot go back to school at this time. Father recently passed from complications due to cancer.  Filed for disability in 2009. Came through earlier this year. Dr. Grover CanavanKrystal diagnosed her with anxiety.   Review of Systems  Constitutional: Negative for diaphoresis and fatigue.  Cardiovascular: Negative for chest pain and palpitations.  Gastrointestinal: Negative for nausea and vomiting.  Neurological: Positive for headaches. Negative for dizziness and light-headedness.  Psychiatric/Behavioral: Negative for behavioral problems and confusion. The patient is nervous/anxious.     Patient Active Problem List   Diagnosis Date Noted  . Bipolar disorder (HCC) 04/21/2014  . Chronic low back pain 04/21/2014  . Screening 04/21/2014  . Morning stiffness of joints 04/21/2014    Current Outpatient Medications on File Prior to Visit  Medication Sig Dispense Refill  . ALPRAZolam (XANAX) 0.25 MG tablet Take 1 tablet (0.25 mg total) by mouth 2 (two) times daily as needed for anxiety. 20 tablet 0  . butalbital-acetaminophen-caffeine (FIORICET) 50-325-40 MG tablet Take 1 tablet by mouth every 6 (six) hours as needed for headache. 20 tablet 0  . conjugated estrogens (PREMARIN) vaginal cream Place 1 Applicatorful vaginally daily.    Marland Kitchen. doxepin (SINEQUAN) 25 MG  capsule Take 1 capsule (25 mg total) by mouth at bedtime. 30 capsule 2  . FLUoxetine (PROZAC) 20 MG capsule Take 1 capsule (20 mg total) by mouth 2 (two) times daily. 60 capsule 2  . ibuprofen (ADVIL,MOTRIN) 800 MG tablet Take 1 tablet (800 mg total) by mouth every 8 (eight) hours as needed. (Patient taking differently: Take 800 mg by mouth every 8 (eight) hours as needed for moderate pain. ) 30 tablet 5  . lamoTRIgine (LAMICTAL) 100 MG tablet Take 0.5 tablets (50 mg total) by mouth daily. 30 tablet 2  . methocarbamol (ROBAXIN) 500 MG tablet Take 1 tablet (500 mg total) by mouth 2 (two) times daily. 20 tablet 0  . ondansetron (ZOFRAN ODT) 4 MG disintegrating tablet Take 1 tablet (4 mg total) by mouth every 8 (eight) hours as needed for nausea or vomiting. 20 tablet 0  . HYDROcodone-acetaminophen (NORCO/VICODIN) 5-325 MG tablet Take 1 tablet by mouth every 4 (four) hours as needed. (Patient not taking: Reported on 09/25/2017) 10 tablet 0  . SUMAtriptan (IMITREX) 50 MG tablet Take 1 tablet (50 mg total) by mouth once. May repeat in 2 hours if headache persists or recurs. (Patient taking differently: Take 50 mg by mouth every 2 (two) hours as needed for migraine. May repeat in 2 hours if headache persists or recurs.) 9 tablet 2  . [DISCONTINUED] famotidine (PEPCID) 20 MG tablet Take 1 tablet (20 mg total) by mouth 2 (two) times daily. 10 tablet 0   No current facility-administered medications on file prior to visit.     No Known Allergies   Objective:  BP  101/64   Pulse 67   Temp 98 F (36.7 C) (Oral)   Resp 16   Ht 5\' 9"  (1.753 m)   Wt 148 lb 6.4 oz (67.3 kg)   SpO2 99%   BMI 21.91 kg/m   Physical Exam  Constitutional: She is oriented to person, place, and time and well-developed, well-nourished, and in no distress. No distress.  Eyes: EOM are normal. Pupils are equal, round, and reactive to light.  Cardiovascular: Normal rate, regular rhythm and normal heart sounds.  Neurological: She  is alert and oriented to person, place, and time. GCS score is 15.  Skin: Skin is warm and dry.  Psychiatric: Mood, memory, affect and judgment normal.  Vitals reviewed.   Assessment and Plan :  1. Other migraine without status migrainosus, intractable - butalbital-acetaminophen-caffeine (FIORICET) 50-325-40 MG tablet; Take 1 tablet by mouth every 6 (six) hours as needed for headache.  Dispense: 20 tablet; Refill: 5 - SUMAtriptan (IMITREX) 50 MG tablet; Take 1 tablet (50 mg total) by mouth once for 1 dose. May repeat in 2 hours if headache persists or recurs.  Dispense: 9 tablet; Refill: 5  2. Generalized anxiety disorder - pt diagnosed with GAD by Dr. Grover CanavanKrystal. She is asking for form to be filled out requesting student loan forgiveness due to disability. She filed for disability in 2009. Came through earlier this year. Plan to have records sent here. Will review records and consider filling out form.   Marco CollieWhitney Rashied Corallo, PA-C  [facility] 09/25/2017 9:43 AM

## 2017-10-15 ENCOUNTER — Telehealth: Payer: Self-pay | Admitting: Physician Assistant

## 2017-10-15 NOTE — Telephone Encounter (Signed)
Copied from CRM [id number]. Topic: Quick Communication - See Telephone Encounter >> Oct 15, 2017  3:25 PM Carrie [name], Carrie [name] wrote: CRM for notification. See Telephone encounter for: 10/15/17. Patient called to check the status of paperwork she left on her last visit. Please call patient to let her know the status.

## 2017-10-31 ENCOUNTER — Emergency Department (HOSPITAL_COMMUNITY): Payer: Medicaid Other

## 2017-10-31 ENCOUNTER — Emergency Department (HOSPITAL_COMMUNITY)
Admission: EM | Admit: 2017-10-31 | Discharge: 2017-10-31 | Disposition: A | Discharge status: Home / Self Care | Payer: Medicaid Other | Attending: Emergency Medicine | Admitting: Emergency Medicine

## 2017-10-31 ENCOUNTER — Encounter (HOSPITAL_COMMUNITY): Payer: Self-pay | Admitting: Emergency Medicine

## 2017-10-31 ENCOUNTER — Other Ambulatory Visit: Payer: Self-pay

## 2017-10-31 DIAGNOSIS — Z87891 Personal history of nicotine dependence: Secondary | ICD-10-CM | POA: Insufficient documentation

## 2017-10-31 DIAGNOSIS — Z79899 Other long term (current) drug therapy: Secondary | ICD-10-CM | POA: Diagnosis not present

## 2017-10-31 DIAGNOSIS — M25571 Pain in right ankle and joints of right foot: Secondary | ICD-10-CM

## 2017-10-31 MED ORDER — ACETAMINOPHEN 500 MG PO TABS: 500.0000 mg | ORAL_TABLET | Freq: Four times a day (QID) | ORAL | 0 refills | Status: DC | PRN

## 2017-10-31 MED ORDER — IBUPROFEN 800 MG PO TABS: 800.0000 mg | ORAL_TABLET | Freq: Three times a day (TID) | ORAL | 0 refills | Status: DC

## 2017-10-31 MED ORDER — HYDROCODONE-ACETAMINOPHEN 5-325 MG PO TABS
2.0000 | ORAL_TABLET | Freq: Once | ORAL | Status: AC
  Administered 2017-10-31: 2 via ORAL
  Filled 2017-10-31: qty 2

## 2017-10-31 NOTE — ED Notes (Signed)
Right ankle elevated using pillow and ice pack applied for comfort.

## 2017-10-31 NOTE — ED Triage Notes (Signed)
Pt c/o 10/10 left ankle pain after she twisted it going down some steps. Some swollen noticed.

## 2017-10-31 NOTE — Progress Notes (Signed)
Orthopedic Tech Progress Note Patient Details:  Carrie GermanLeona S [name] Nov 16, 1970 [id number]  Ortho Devices Type of Ortho Device: ASO Ortho Device/Splint Location: rle Ortho Device/Splint Interventions: Ordered, Application, Adjustment   Post Interventions Patient Tolerated: Well Instructions Provided: Care of device, Adjustment of device   Trinna PostMartinez, Arend Bahl J 10/31/2017, 2:13 AM

## 2017-10-31 NOTE — ED Notes (Signed)
PA explained tests results and plan of care to pt. 

## 2017-10-31 NOTE — Discharge Instructions (Signed)
Medications: Ibuprofen, Tylenol  Treatment: Take ibuprofen every 8 hours to help with pain and swelling. You can alternate with Tylenol for breakthrough pain 4 hours after taking ibuprofen. Use ice 3-4 times daily alternating 20 minutes on, 20 minutes off. Keep your leg elevated whenever you're not walking on it. Wear your splint at all times except when bathing. Use crutches at all times initially and begin partial weightbearing as tolerated.  Follow-up: Please follow-up with the orthopedic doctor if your symptoms are not improving over the next 10-14 days. Please return to the emergency department if you develop any new or worsening symptoms.

## 2017-10-31 NOTE — ED Provider Notes (Signed)
[facility] EMERGENCY DEPARTMENT Provider Note   CSN: [id number] Arrival date & time: 10/31/17  0049     History   Chief Complaint Chief Complaint  Patient presents with  . Ankle Pain    HPI Carrie [name] is a 47 y.o. female with history of bipolar disorder, chronic back pain who presents with right ankle pain after stepping off a step and rolling her ankle.  She was able to ambulate, however with significant pain.  She has mild tingling to the bottom of her foot.  She did not hit her head or lose consciousness.  She denies any knee pain.  She did not take any medications prior to arrival.  She has been using ice in the emergency department.  HPI  Past Medical History:  Diagnosis Date  . Chronic back pain   . Hypercholesteremia   . Migraine     Patient Active Problem List   Diagnosis Date Noted  . Bipolar disorder (HCC) 04/21/2014  . Chronic low back pain 04/21/2014  . Screening 04/21/2014  . Morning stiffness of joints 04/21/2014    Past Surgical History:  Procedure Laterality Date  . BREAST LUMPECTOMY    . CESAREAN SECTION    . DILATION AND CURETTAGE OF UTERUS      OB History    Gravida Para Term Preterm AB Living   7 5 5   2 5    SAB TAB Ectopic Multiple Live Births   1   1   5        Home Medications    Prior to Admission medications   Medication Sig Start Date End Date Taking? Authorizing Provider  ALPRAZolam (XANAX) 0.25 MG tablet Take 1 tablet (0.25 mg total) by mouth 2 (two) times daily as needed for anxiety. 01/08/17   McVey, Madelaine Bhat, PA-C  butalbital-acetaminophen-caffeine (FIORICET) [phone] MG tablet Take 1 tablet by mouth every 6 (six) hours as needed for headache. 09/25/17 09/25/18  McVey, Madelaine Bhat, PA-C  conjugated estrogens (PREMARIN) vaginal cream Place 1 Applicatorful vaginally daily.    [provider]  doxepin (SINEQUAN) 25 MG capsule Take 1 capsule (25 mg total) by mouth at bedtime. 01/08/17    McVey, Madelaine Bhat, PA-C  FLUoxetine (PROZAC) 20 MG capsule Take 1 capsule (20 mg total) by mouth 2 (two) times daily. 01/08/17   McVey, Madelaine Bhat, PA-C  ibuprofen (ADVIL,MOTRIN) 800 MG tablet Take 1 tablet (800 mg total) by mouth every 8 (eight) hours as needed. Patient taking differently: Take 800 mg by mouth every 8 (eight) hours as needed for moderate pain.  10/16/16   Brock Bad, MD  lamoTRIgine (LAMICTAL) 100 MG tablet Take 0.5 tablets (50 mg total) by mouth daily. 01/08/17   McVey, Madelaine Bhat, PA-C  methocarbamol (ROBAXIN) 500 MG tablet Take 1 tablet (500 mg total) by mouth 2 (two) times daily. 04/29/17   Barrett Henle, PA-C  ondansetron (ZOFRAN ODT) 4 MG disintegrating tablet Take 1 tablet (4 mg total) by mouth every 8 (eight) hours as needed for nausea or vomiting. 02/03/16   Joy, Shawn C, PA-C  SUMAtriptan (IMITREX) 50 MG tablet Take 1 tablet (50 mg total) by mouth once for 1 dose. May repeat in 2 hours if headache persists or recurs. 09/25/17 09/25/17  McVey, Madelaine Bhat, PA-C    Family History Family History  Problem Relation Age of Onset  . Cancer Mother        breast cancer   . Multiple sclerosis Mother   .  Thyroid disease Mother   . Cancer Maternal Grandmother     Social History Social History   Tobacco Use  . Smoking status: Former Games developermoker  . Smokeless tobacco: Current User  Substance Use Topics  . Alcohol use: No    Comment: OCCASIONAL  . Drug use: No    Comment: reports cocaine addict 12 yrs ago     Allergies   Patient has no known allergies.   Review of Systems Review of Systems  Musculoskeletal: Positive for arthralgias (R ankle) and joint swelling (R ankle).  Neurological: Negative for numbness.     Physical Exam Updated Vital Signs BP 103/71 (BP Location: Right Arm)   Pulse 77   Temp 98.4 F (36.9 C) (Oral)   Resp 20   Ht 5\' 9"  (1.753 m)   Wt 67.1 kg (148 lb)   LMP 01/16/2017   SpO2 99%   BMI 21.86 kg/m    Physical Exam  Constitutional: She is oriented to person, place, and time. She appears well-developed and well-nourished. No distress.  HENT:  Head: Normocephalic and atraumatic.  Eyes: Conjunctivae are normal. Right eye exhibits no discharge. Left eye exhibits no discharge. No scleral icterus.  Neck: Normal range of motion.  Cardiovascular: Normal rate, regular rhythm, normal heart sounds and intact distal pulses. Exam reveals no gallop and no friction rub.  No murmur heard. Pulmonary/Chest: Effort normal and breath sounds normal. No stridor. No respiratory distress. She has no wheezes. She has no rales.  Musculoskeletal:       Right ankle: She exhibits decreased range of motion and swelling. She exhibits no deformity and normal pulse. Tenderness. Lateral malleolus and medial malleolus tenderness found. No head of 5th metatarsal and no proximal fibula tenderness found. Achilles tendon normal.  Neurological: She is alert and oriented to person, place, and time. Coordination normal.  Skin: Skin is warm and dry. No rash noted. She is not diaphoretic. No pallor.  Psychiatric: She has a normal mood and affect. Her behavior is normal. Judgment and thought content normal.  Nursing note and vitals reviewed.    ED Treatments / Results  Labs (all labs ordered are listed, but only abnormal results are displayed) Labs Reviewed - No data to display  EKG  EKG Interpretation None       Radiology Dg Ankle Complete Right  Result Date: 10/31/2017 CLINICAL DATA:  Right lateral ankle pain and swelling after trip and fall injury. EXAM: RIGHT ANKLE - COMPLETE 3+ VIEW COMPARISON:  Right foot 07/20/2016. FINDINGS: Old appearing ununited ossicles are demonstrated inferior to the lateral malleolus. No evidence of acute fracture or dislocation of the right ankle. No focal bone lesion or bone destruction. Soft tissues are unremarkable. IMPRESSION: No acute bony abnormalities. Electronically Signed   By:  Burman NievesWilliam  Stevens M.D.   On: 10/31/2017 01:36    Procedures Procedures (including critical care time)  Medications Ordered in ED Medications  HYDROcodone-acetaminophen (NORCO/VICODIN) 5-325 MG per tablet 2 tablet (not administered)     Initial Impression / Assessment and Plan / ED Course  I have reviewed the triage vital signs and the nursing notes.  Pertinent labs & imaging results that were available during my care of the patient were reviewed by me and considered in my medical decision making (see chart for details).     Patient with probable ankle sprain.  Patient X-Ray negative for obvious fracture or dislocation. Pain managed in ED.   Home Care discussed including rest and elevate the injured ankle, apply  ice intermittently. Use crutches without weight bearing until able to comfortable bear partial weight, then progress to full weight bearing as tolerated. Splint applied. See ortho prn.  Discharge home with ibuprofen and Tylenol.  Patient understands and agrees with plan.  Patient vitals stable throughout ED course and discharged in satisfactory condition.  Final Clinical Impressions(s) / ED Diagnoses   Final diagnoses:  Acute right ankle pain    ED Discharge Orders    None       Emi Holes, PA-C 10/31/17 0156    Ward, Layla Maw, DO 10/31/17 0327

## 2018-01-14 ENCOUNTER — Other Ambulatory Visit: Payer: Self-pay | Admitting: Family Medicine

## 2018-01-14 DIAGNOSIS — G43819 Other migraine, intractable, without status migrainosus: Secondary | ICD-10-CM

## 2018-03-05 ENCOUNTER — Other Ambulatory Visit: Payer: Self-pay

## 2018-03-05 ENCOUNTER — Encounter (HOSPITAL_COMMUNITY): Payer: Self-pay

## 2018-03-05 ENCOUNTER — Emergency Department (HOSPITAL_COMMUNITY)
Admission: EM | Admit: 2018-03-05 | Discharge: 2018-03-05 | Disposition: A | Discharge status: Home / Self Care | Payer: Medicaid Other | Attending: Emergency Medicine | Admitting: Emergency Medicine

## 2018-03-05 ENCOUNTER — Emergency Department (HOSPITAL_COMMUNITY): Payer: Medicaid Other

## 2018-03-05 DIAGNOSIS — Z79899 Other long term (current) drug therapy: Secondary | ICD-10-CM | POA: Diagnosis not present

## 2018-03-05 DIAGNOSIS — Y92009 Unspecified place in unspecified non-institutional (private) residence as the place of occurrence of the external cause: Secondary | ICD-10-CM | POA: Diagnosis not present

## 2018-03-05 DIAGNOSIS — W010XXA Fall on same level from slipping, tripping and stumbling without subsequent striking against object, initial encounter: Secondary | ICD-10-CM | POA: Diagnosis not present

## 2018-03-05 DIAGNOSIS — Z87891 Personal history of nicotine dependence: Secondary | ICD-10-CM | POA: Diagnosis not present

## 2018-03-05 DIAGNOSIS — Z23 Encounter for immunization: Secondary | ICD-10-CM | POA: Diagnosis not present

## 2018-03-05 DIAGNOSIS — Y999 Unspecified external cause status: Secondary | ICD-10-CM | POA: Insufficient documentation

## 2018-03-05 DIAGNOSIS — Y939 Activity, unspecified: Secondary | ICD-10-CM | POA: Diagnosis not present

## 2018-03-05 DIAGNOSIS — S81812A Laceration without foreign body, left lower leg, initial encounter: Secondary | ICD-10-CM | POA: Diagnosis not present

## 2018-03-05 DIAGNOSIS — S81012A Laceration without foreign body, left knee, initial encounter: Secondary | ICD-10-CM

## 2018-03-05 DIAGNOSIS — S8992XA Unspecified injury of left lower leg, initial encounter: Secondary | ICD-10-CM | POA: Diagnosis present

## 2018-03-05 MED ORDER — LORAZEPAM 2 MG/ML IJ SOLN: 1.0000 mg | Freq: Once | INTRAMUSCULAR | Status: DC

## 2018-03-05 MED ORDER — IBUPROFEN 400 MG PO TABS
400.0000 mg | ORAL_TABLET | Freq: Once | ORAL | Status: AC | PRN
  Administered 2018-03-05: 400 mg via ORAL
  Filled 2018-03-05: qty 1

## 2018-03-05 MED ORDER — IBUPROFEN 800 MG PO TABS: 800.0000 mg | ORAL_TABLET | Freq: Three times a day (TID) | ORAL | 0 refills | Status: DC

## 2018-03-05 MED ORDER — ONDANSETRON HCL 4 MG/2ML IJ SOLN: 4.0000 mg | Freq: Once | INTRAMUSCULAR | Status: DC

## 2018-03-05 MED ORDER — ONDANSETRON 4 MG PO TBDP
4.0000 mg | ORAL_TABLET | Freq: Once | ORAL | Status: AC | PRN
  Administered 2018-03-05: 4 mg via ORAL
  Filled 2018-03-05: qty 1

## 2018-03-05 MED ORDER — LIDOCAINE-EPINEPHRINE (PF) 2 %-1:200000 IJ SOLN
20.0000 mL | Freq: Once | INTRAMUSCULAR | Status: AC
  Administered 2018-03-05: 20 mL
  Filled 2018-03-05: qty 20

## 2018-03-05 MED ORDER — LORAZEPAM 2 MG/ML IJ SOLN
1.0000 mg | Freq: Once | INTRAMUSCULAR | Status: AC
  Administered 2018-03-05: 1 mg via INTRAMUSCULAR
  Filled 2018-03-05: qty 1

## 2018-03-05 MED ORDER — TETANUS-DIPHTH-ACELL PERTUSSIS 5-2.5-18.5 LF-MCG/0.5 IM SUSP
0.5000 mL | Freq: Once | INTRAMUSCULAR | Status: AC
  Administered 2018-03-05: 0.5 mL via INTRAMUSCULAR
  Filled 2018-03-05: qty 0.5

## 2018-03-05 NOTE — ED Notes (Signed)
Ortho tech here 

## 2018-03-05 NOTE — Discharge Instructions (Addendum)
Thank you for allowing me to care for you today in the emergency department.  Please wear the knee immobilizer until your sutures are removed so that you can avoid ripping them out  The sutures need to be removed in 8 to 10 days.  You can schedule an appointment with your primary care provider, go to urgent care, or return to the emergency department to have them removed.  Take 800 mg of ibuprofen with food or thousand milligrams of Tylenol every 8 hours for pain control.  Apply ice for 15 to 20 minutes up to 3-4 times a day.  Elevate your leg above the level of your heart to help with pain and swelling when you are sitting.  Keep the bandage on for the first 24 hours.  After that time, clean the area with warm soap and water.  Pat the area dry then apply a topical antibiotic such as bacitracin or Neosporin.  Then apply a clean gauze dressing to the wound.  After the dressing is been applied, reapply the knee immobilizer.  Make sure to change the dressing at least once a day or anytime it gets soiled or wet.  Return to the emergency department if you develop fever, chills, or if the wound becomes red, hot to the touch, or swollen.

## 2018-03-05 NOTE — ED Triage Notes (Signed)
Pt states that she tripped over a raised area in the floor and landed on her left knee on the threshold. Large laceration to left knee noted with bleeding controlled at this time.

## 2018-03-05 NOTE — Progress Notes (Signed)
Orthopedic Tech Progress Note Patient Details:  Carrie [name] Feb 12, 1971 [id number]  Ortho Devices Type of Ortho Device: Knee Immobilizer Ortho Device/Splint Location: LLE Ortho Device/Splint Interventions: Ordered, Application   Post Interventions Patient Tolerated: Well Instructions Provided: Care of device   Jennye Moccasin 03/05/2018, 5:22 PM

## 2018-03-05 NOTE — ED Notes (Signed)
Applied antibiotic ont. Wrapped with 4x4 and kerlix

## 2018-03-05 NOTE — ED Provider Notes (Signed)
[facility] EMERGENCY DEPARTMENT Provider Note   CSN: [id number] Arrival date & time: 03/05/18  1153     History   Chief Complaint Chief Complaint  Patient presents with  . Laceration    HPI Carrie [name] is a 47 y.o. female with a history of bipolar disorder, migraine, and hypercholesteremia presents to the emergency department with a chief complaint of fall.  The patient reports that she tripped over the family dog and fell forward and landed on her left knee on her floor just prior to arrival.  She was able to stand and ambulate after the injury.  She denies hitting her head, LOC, nausea, or emesis.  Pain is constant.  Pain is improved with keeping the knee fully straight and worse with bending the knee.   She denies left knee, hip, ankle pain, numbness, or weakness at this time.  No treatment prior to arrival.  He is kept the leg fully extended since the injury.   She does not take any blood thinners.  She is unsure when her tetanus was last updated.  The history is provided by the patient. No language interpreter was used.  Laceration      Past Medical History:  Diagnosis Date  . Chronic back pain   . Hypercholesteremia   . Migraine     Patient Active Problem List   Diagnosis Date Noted  . Bipolar disorder (HCC) 04/21/2014  . Chronic low back pain 04/21/2014  . Screening 04/21/2014  . Morning stiffness of joints 04/21/2014    Past Surgical History:  Procedure Laterality Date  . BREAST LUMPECTOMY    . CESAREAN SECTION    . DILATION AND CURETTAGE OF UTERUS       OB History    Gravida  7   Para  5   Term  5   Preterm      AB  2   Living  5     SAB  1   TAB      Ectopic  1   Multiple      Live Births  5            Home Medications    Prior to Admission medications   Medication Sig Start Date End Date Taking? Authorizing Provider  acetaminophen (TYLENOL) 500 MG tablet Take 1 tablet (500 mg total) by mouth every  6 (six) hours as needed. 10/31/17   Law, Waylan Boga, PA-C  ALPRAZolam (XANAX) 0.25 MG tablet Take 1 tablet (0.25 mg total) by mouth 2 (two) times daily as needed for anxiety. 01/08/17   McVey, Madelaine Bhat, PA-C  butalbital-acetaminophen-caffeine (FIORICET) [phone] MG tablet Take 1 tablet by mouth every 6 (six) hours as needed for headache. 09/25/17 09/25/18  McVey, Madelaine Bhat, PA-C  conjugated estrogens (PREMARIN) vaginal cream Place 1 Applicatorful vaginally daily.    [provider]  doxepin (SINEQUAN) 25 MG capsule Take 1 capsule (25 mg total) by mouth at bedtime. 01/08/17   McVey, Madelaine Bhat, PA-C  FLUoxetine (PROZAC) 20 MG capsule Take 1 capsule (20 mg total) by mouth 2 (two) times daily. 01/08/17   McVey, Madelaine Bhat, PA-C  ibuprofen (ADVIL,MOTRIN) 800 MG tablet Take 1 tablet (800 mg total) by mouth 3 (three) times daily. 03/05/18   Larance Ratledge A, PA-C  lamoTRIgine (LAMICTAL) 100 MG tablet Take 0.5 tablets (50 mg total) by mouth daily. 01/08/17   McVey, Madelaine Bhat, PA-C  methocarbamol (ROBAXIN) 500 MG tablet Take 1 tablet (500 mg  total) by mouth 2 (two) times daily. 04/29/17   Barrett Henle, PA-C  ondansetron (ZOFRAN ODT) 4 MG disintegrating tablet Take 1 tablet (4 mg total) by mouth every 8 (eight) hours as needed for nausea or vomiting. 02/03/16   Joy, Shawn C, PA-C  SUMAtriptan (IMITREX) 50 MG tablet Take 1 tablet (50 mg total) by mouth once for 1 dose. May repeat in 2 hours if headache persists or recurs. 09/25/17 09/25/17  McVey, Madelaine Bhat, PA-C    Family History Family History  Problem Relation Age of Onset  . Cancer Mother        breast cancer   . Multiple sclerosis Mother   . Thyroid disease Mother   . Cancer Maternal Grandmother     Social History Social History   Tobacco Use  . Smoking status: Former Games developer  . Smokeless tobacco: Current User  Substance Use Topics  . Alcohol use: No    Comment: OCCASIONAL  . Drug  use: No    Types: Cocaine, Marijuana    Comment: reports cocaine addict 12 yrs ago     Allergies   Patient has no known allergies.   Review of Systems Review of Systems  Constitutional: Negative for activity change, chills and fever.  Respiratory: Negative for shortness of breath.   Cardiovascular: Negative for chest pain.  Gastrointestinal: Negative for abdominal pain, diarrhea and vomiting.  Musculoskeletal: Negative for arthralgias, back pain and myalgias.  Skin: Positive for wound. Negative for color change and rash.  Neurological: Negative for weakness and numbness.   Physical Exam Updated Vital Signs BP 112/72 (BP Location: Right Arm)   Pulse 72   Temp 98.2 F (36.8 C) (Oral)   Resp 16   Ht  (1.753 m)   Wt 68 kg (150 lb)   LMP 01/16/2017   SpO2 100%   BMI 22.15 kg/m   Physical Exam  Constitutional: No distress.  HENT:  Head: Normocephalic.  Eyes: Conjunctivae are normal.  Neck: Neck supple.  Cardiovascular: Normal rate and regular rhythm. Exam reveals no gallop and no friction rub.  No murmur heard. Pulmonary/Chest: Effort normal. No respiratory distress.  Abdominal: Soft. She exhibits no distension.  Musculoskeletal: She exhibits no edema or tenderness.  4 cm U-shaped laceration to the left knee.  Wound is hemostatic.  No obvious foreign bodies.  DP pulses are 2+ and symmetric.  Sensation is intact throughout the bilateral lower extremities.  5 out of 5 strength against resistance with dorsiflexion plantar flexion.  Left knee, ankle, and left hip are nontender to palpation.  Neurological: She is alert.  Skin: Skin is warm. No rash noted.  Psychiatric: Her behavior is normal.  Nursing note and vitals reviewed.     ED Treatments / Results  Labs (all labs ordered are listed, but only abnormal results are displayed) Labs Reviewed - No data to display  EKG None  Radiology Dg Knee Complete 4 Views Left  Result Date: 03/05/2018 CLINICAL DATA:   Patient status post fall. Soft tissue laceration. Initial encounter. EXAM: LEFT KNEE - COMPLETE 4+ VIEW COMPARISON:  Knee radiograph 01/18/2016 FINDINGS: Normal anatomic alignment. No evidence for acute fracture or dislocation. Bandaging material and soft tissue swelling overlying the patella. IMPRESSION: No acute fracture. Soft tissue injury overlying the patella. Electronically Signed   By: Annia Belt M.D.   On: 03/05/2018 13:01    Procedures .Marland KitchenLaceration Repair Date/Time: 03/05/2018 6:28 PM Performed by: Barkley Boards, PA-C Authorized by: Barkley Boards, PA-C  Consent:    Consent obtained:  Verbal   Consent given by:  Patient   Risks discussed:  Infection, pain, retained foreign body, need for additional repair, poor cosmetic result and poor wound healing   Alternatives discussed:  No treatment Anesthesia (see MAR for exact dosages):    Anesthesia method:  Local infiltration   Local anesthetic:  Lidocaine 2% WITH epi Laceration details:    Location:  Leg   Leg location:  L knee   Length (cm):  4 Repair type:    Repair type:  Simple Pre-procedure details:    Preparation:  Patient was prepped and draped in usual sterile fashion and imaging obtained to evaluate for foreign bodies Exploration:    Hemostasis achieved with:  Direct pressure   Wound exploration: wound explored through full range of motion and entire depth of wound probed and visualized   Treatment:    Area cleansed with:  Betadine, Hibiclens and saline   Amount of cleaning:  Standard   Irrigation solution:  Sterile saline Skin repair:    Repair method:  Sutures   Suture size:  3-0   Suture material:  Prolene   Suture technique: Simple interrupted and horizontal mattress.   Number of sutures:  4 Approximation:    Approximation:  Close Post-procedure details:    Dressing:  Antibiotic ointment and sterile dressing   Patient tolerance of procedure:  Tolerated well, no immediate complications   (including  critical care time)  Medications Ordered in ED Medications  ibuprofen (ADVIL,MOTRIN) tablet 400 mg (400 mg Oral Given 03/05/18 1232)  ondansetron (ZOFRAN-ODT) disintegrating tablet 4 mg (4 mg Oral Given 03/05/18 1232)  lidocaine-EPINEPHrine (XYLOCAINE W/EPI) 2 %-1:200000 (PF) injection 20 mL (20 mLs Infiltration Given by Other 03/05/18 1728)  Tdap (BOOSTRIX) injection 0.5 mL (0.5 mLs Intramuscular Given 03/05/18 1610)  LORazepam (ATIVAN) injection 1 mg (1 mg Intramuscular Given 03/05/18 1608)     Initial Impression / Assessment and Plan / ED Course  I have reviewed the triage vital signs and the nursing notes.  Pertinent labs & imaging results that were available during my care of the patient were reviewed by me and considered in my medical decision making (see chart for details).     47 year old female with a history of bipolar disorder, cholesteremia presenting with fall.  Left knee x-ray is negative for fracture.  Normal exam of the left lower extremity.  She has a 4 cm laceration overlying the left knee.  She is very anxious appearing; Ativan given.  Pressure irrigation performed. Wound explored and base of wound visualized in a bloodless field without evidence of foreign body.  Laceration occurred < 8 hours prior to repair which was well tolerated. Tdap updated.  Pt has no comorbidities to effect normal wound healing. Pt discharged without antibiotics.  Discussed suture home care with patient and answered questions. Pt to follow-up for wound check and suture removal in 8-10 days; they are to return to the ED sooner for signs of infection. Pt is hemodynamically stable with no complaints prior to dc.   Final Clinical Impressions(s) / ED Diagnoses   Final diagnoses:  Knee laceration, left, initial encounter    ED Discharge Orders        Ordered    ibuprofen (ADVIL,MOTRIN) 800 MG tablet  3 times daily     03/05/18 1723       Christoher Drudge, Coral Else, PA-C 03/05/18 1831    Tegeler,  Canary Brim, MD 03/06/18 [phone]

## 2018-03-14 ENCOUNTER — Emergency Department (HOSPITAL_COMMUNITY)
Admission: EM | Admit: 2018-03-14 | Discharge: 2018-03-14 | Disposition: A | Discharge status: Home / Self Care | Payer: Medicaid Other | Attending: Emergency Medicine | Admitting: Emergency Medicine

## 2018-03-14 ENCOUNTER — Other Ambulatory Visit: Payer: Self-pay

## 2018-03-14 ENCOUNTER — Encounter (HOSPITAL_COMMUNITY): Payer: Self-pay

## 2018-03-14 DIAGNOSIS — W010XXA Fall on same level from slipping, tripping and stumbling without subsequent striking against object, initial encounter: Secondary | ICD-10-CM | POA: Insufficient documentation

## 2018-03-14 DIAGNOSIS — Z4802 Encounter for removal of sutures: Secondary | ICD-10-CM | POA: Diagnosis not present

## 2018-03-14 DIAGNOSIS — F17220 Nicotine dependence, chewing tobacco, uncomplicated: Secondary | ICD-10-CM | POA: Diagnosis not present

## 2018-03-14 DIAGNOSIS — S81012D Laceration without foreign body, left knee, subsequent encounter: Secondary | ICD-10-CM | POA: Insufficient documentation

## 2018-03-14 DIAGNOSIS — Z79899 Other long term (current) drug therapy: Secondary | ICD-10-CM | POA: Diagnosis not present

## 2018-03-14 NOTE — ED Provider Notes (Signed)
[facility] EMERGENCY DEPARTMENT Provider Note   CSN: [id number] Arrival date & time: 03/14/18  1036     History   Chief Complaint Chief Complaint  Patient presents with  . Suture / Staple Removal    HPI Carrie [name] is a 47 y.o. female here for suture removal.  Patient had a mechanical fall on 5/28, she fell forward and landed directly on her left knee causing the laceration.  Laceration was repaired with sutures in the ER and she was instructed to return today for suture removal.  She has been keeping the wound clean and wearing a knee brace.  She denies infectious symptoms such as increased swelling, pain, redness, warmth, fevers.  She has mild discomfort to the surface of the skin with joint range of motion.  Pain is mild, aggravated with range of motion and direct palpation.  Alleviated with rest.  No paresthesias or numbness distally. Tetanus updated on 5/28.  HPI  Past Medical History:  Diagnosis Date  . Chronic back pain   . Hypercholesteremia   . Migraine     Patient Active Problem List   Diagnosis Date Noted  . Bipolar disorder (HCC) 04/21/2014  . Chronic low back pain 04/21/2014  . Screening 04/21/2014  . Morning stiffness of joints 04/21/2014    Past Surgical History:  Procedure Laterality Date  . BREAST LUMPECTOMY    . CESAREAN SECTION    . DILATION AND CURETTAGE OF UTERUS       OB History    Gravida  7   Para  5   Term  5   Preterm      AB  2   Living  5     SAB  1   TAB      Ectopic  1   Multiple      Live Births  5            Home Medications    Prior to Admission medications   Medication Sig Start Date End Date Taking? Authorizing Provider  acetaminophen (TYLENOL) 500 MG tablet Take 1 tablet (500 mg total) by mouth every 6 (six) hours as needed. 10/31/17   Law, Waylan Boga, PA-C  ALPRAZolam (XANAX) 0.25 MG tablet Take 1 tablet (0.25 mg total) by mouth 2 (two) times daily as needed for anxiety. 01/08/17    McVey, Madelaine Bhat, PA-C  butalbital-acetaminophen-caffeine (FIORICET) [phone] MG tablet Take 1 tablet by mouth every 6 (six) hours as needed for headache. 09/25/17 09/25/18  McVey, Madelaine Bhat, PA-C  conjugated estrogens (PREMARIN) vaginal cream Place 1 Applicatorful vaginally daily.    [provider]  doxepin (SINEQUAN) 25 MG capsule Take 1 capsule (25 mg total) by mouth at bedtime. 01/08/17   McVey, Madelaine Bhat, PA-C  FLUoxetine (PROZAC) 20 MG capsule Take 1 capsule (20 mg total) by mouth 2 (two) times daily. 01/08/17   McVey, Madelaine Bhat, PA-C  ibuprofen (ADVIL,MOTRIN) 800 MG tablet Take 1 tablet (800 mg total) by mouth 3 (three) times daily. 03/05/18   McDonald, Mia A, PA-C  lamoTRIgine (LAMICTAL) 100 MG tablet Take 0.5 tablets (50 mg total) by mouth daily. 01/08/17   McVey, Madelaine Bhat, PA-C  methocarbamol (ROBAXIN) 500 MG tablet Take 1 tablet (500 mg total) by mouth 2 (two) times daily. 04/29/17   Barrett Henle, PA-C  ondansetron (ZOFRAN ODT) 4 MG disintegrating tablet Take 1 tablet (4 mg total) by mouth every 8 (eight) hours as needed for nausea or vomiting. 02/03/16  Joy, Shawn C, PA-C  SUMAtriptan (IMITREX) 50 MG tablet Take 1 tablet (50 mg total) by mouth once for 1 dose. May repeat in 2 hours if headache persists or recurs. 09/25/17 09/25/17  McVey, Madelaine BhatElizabeth Whitney, PA-C    Family History Family History  Problem Relation Age of Onset  . Cancer Mother        breast cancer   . Multiple sclerosis Mother   . Thyroid disease Mother   . Cancer Maternal Grandmother     Social History Social History   Tobacco Use  . Smoking status: Former Games developermoker  . Smokeless tobacco: Current User  Substance Use Topics  . Alcohol use: No    Comment: OCCASIONAL  . Drug use: No    Types: Cocaine, Marijuana    Comment: reports cocaine addict 12 yrs ago     Allergies   Patient has no known allergies.   Review of Systems Review of Systems  Skin:  Positive for wound.  All other systems reviewed and are negative.    Physical Exam Updated Vital Signs BP 97/74 (BP Location: Right Arm)   Pulse 62   Temp 98.4 F (36.9 C) (Oral)   Resp 16   Ht 5\' 9"  (1.753 m)   Wt 68 kg (150 lb)   LMP 01/16/2017   SpO2 98%   BMI 22.15 kg/m   Physical Exam  Constitutional: She is oriented to person, place, and time. She appears well-developed and well-nourished.  Non-toxic appearance.  HENT:  Head: Normocephalic.  Right Ear: External ear normal.  Left Ear: External ear normal.  Nose: Nose normal.  Eyes: Conjunctivae and EOM are normal.  Neck: Full passive range of motion without pain.  Cardiovascular: Normal rate.  Pulmonary/Chest: Effort normal. No tachypnea. No respiratory distress.  Musculoskeletal: Normal range of motion.  No focal bony tenderness to left patella, medial lateral joint lines, MCL, LCL or popliteal space.  Full, painless PROM of the left knee with normal J tracking of the patella.  Calf compartments are soft.  5/5 strength with knee F/E and ankle F/PE against resistance in the left leg.  Neurological: She is alert and oriented to person, place, and time.  Sensation to light touch intact in left lower extremity.    Skin: Skin is warm and dry. Capillary refill takes less than 2 seconds.  Approximately 4 cm upside down U-shaped laceration to the left scapula with 4 sutures in place.  No surrounding erythema, edema, warmth, fluctuance, drainage.  Psychiatric: Her behavior is normal. Thought content normal.     ED Treatments / Results  Labs (all labs ordered are listed, but only abnormal results are displayed) Labs Reviewed - No data to display  EKG None  Radiology No results found.  Procedures .Marland Kitchen.Laceration Repair Date/Time: 03/14/2018 11:38 AM Performed by: Liberty HandyGibbons, Daton Szilagyi J, PA-C Authorized by: Liberty HandyGibbons, Dwon Sky J, PA-C   Consent:    Consent obtained:  Verbal   Consent given by:  Patient   Risks discussed:   Need for additional repair   Alternatives discussed:  No treatment Anesthesia (see MAR for exact dosages):    Anesthesia method:  None Laceration details:    Location:  Leg   Leg location:  L knee   Length (cm):  4 Repair type:    Repair type:  Simple Pre-procedure details:    Preparation:  Patient was prepped and draped in usual sterile fashion Exploration:    Hemostasis obtained with: hemostatic.   Contaminated: no   Skin repair:  Repair method:  Steri-Strips Approximation:    Approximation:  Close Post-procedure details:    Dressing:  Open (no dressing)   Patient tolerance of procedure:  Tolerated well, no immediate complications   (including critical care time)  Medications Ordered in ED Medications - No data to display   Initial Impression / Assessment and Plan / ED Course  I have reviewed the triage vital signs and the nursing notes.  Pertinent labs & imaging results that were available during my care of the patient were reviewed by me and considered in my medical decision making (see chart for details).     Pt to ER for suture removal and wound check as above. Procedure tolerated well. Vitals normal.  Laceration appears to be healing well with no signs of infection or dehiscence. Steri strips placed to prevent dehiscence given that laceration is over joint per pt request. Scar minimization & wound care instructions given. ED return precautions given at dc.    Final Clinical Impressions(s) / ED Diagnoses   Final diagnoses:  Encounter for removal of sutures    ED Discharge Orders    None       Liberty Handy, PA-C 03/14/18 1140    Lorre Nick, MD 03/16/18 1158

## 2018-03-14 NOTE — Discharge Instructions (Addendum)
Steri strips will fall off on their own usually within 2 weeks. If they remain on after 2 weeks, you may remove them gently. Return to ER for signs of infection including redness, warmth, swelling, increased pain, pus, fevers.

## 2018-03-14 NOTE — ED Triage Notes (Signed)
Pt came to have sutures removed from knee, requesting steri strips for after for concern about knee reopening.

## 2018-11-09 ENCOUNTER — Other Ambulatory Visit: Payer: Self-pay | Admitting: Physician Assistant

## 2018-11-09 DIAGNOSIS — G43819 Other migraine, intractable, without status migrainosus: Secondary | ICD-10-CM

## 2018-11-11 NOTE — Telephone Encounter (Signed)
Patient called, left VM to return call to the office. Patient will need to schedule an ov with a new provider, if she chooses to say at [facility], to establish care/physical. Last OV:09/25/17.

## 2018-11-13 ENCOUNTER — Other Ambulatory Visit: Payer: Self-pay | Admitting: Internal Medicine

## 2019-01-19 IMAGING — CR DG KNEE COMPLETE 4+V*L*
4 series · 4 of 4 positions shown · non-contrast
Comparison: Knee radiograph 01/18/2016

CLINICAL DATA: Patient status post fall. Soft tissue laceration.
Initial encounter.

EXAM:
LEFT KNEE - COMPLETE 4+ VIEW

[knee ap]
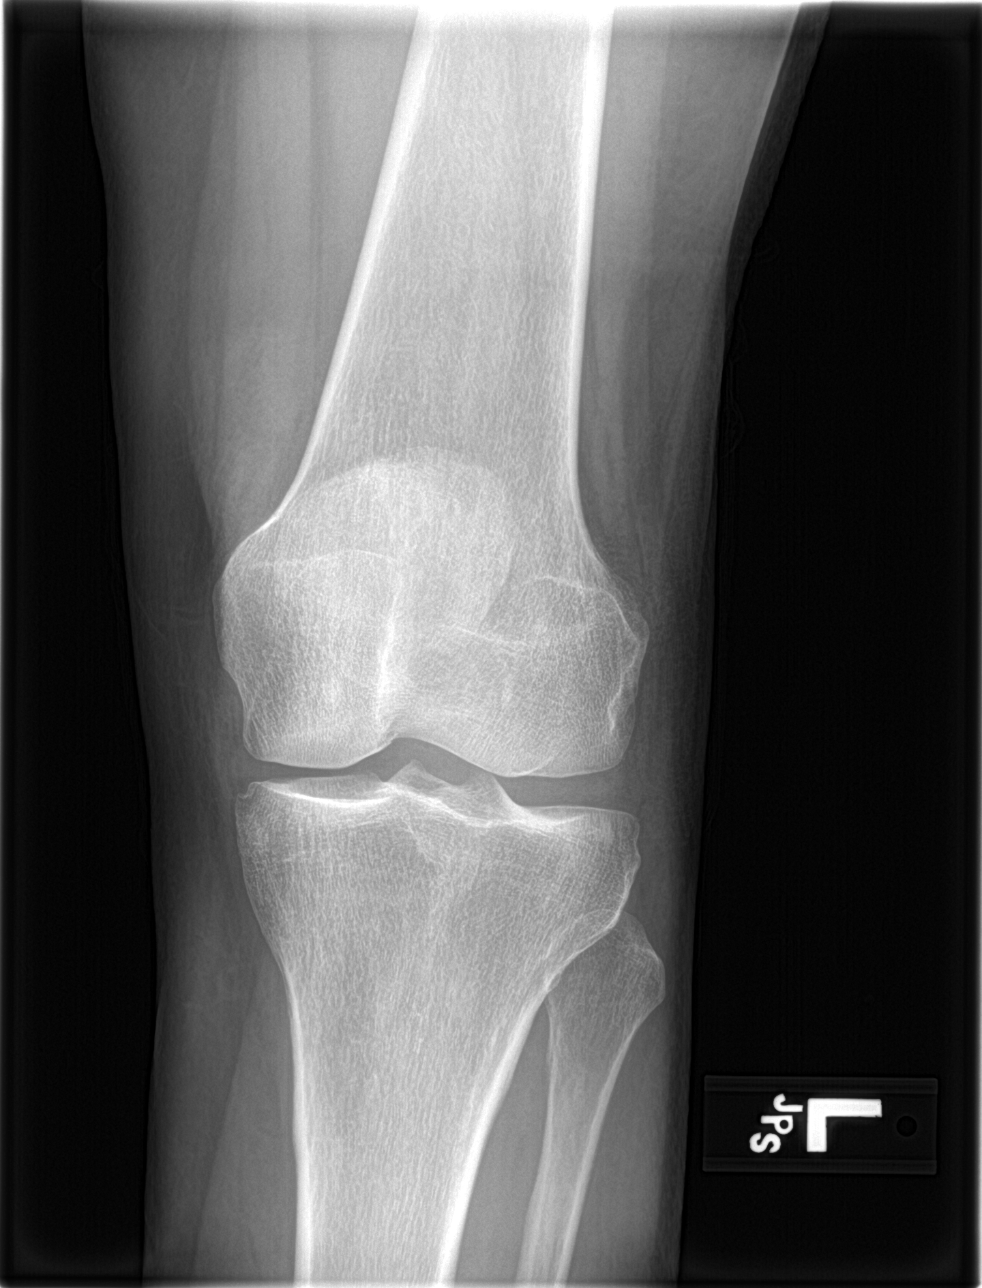

[knee lat]
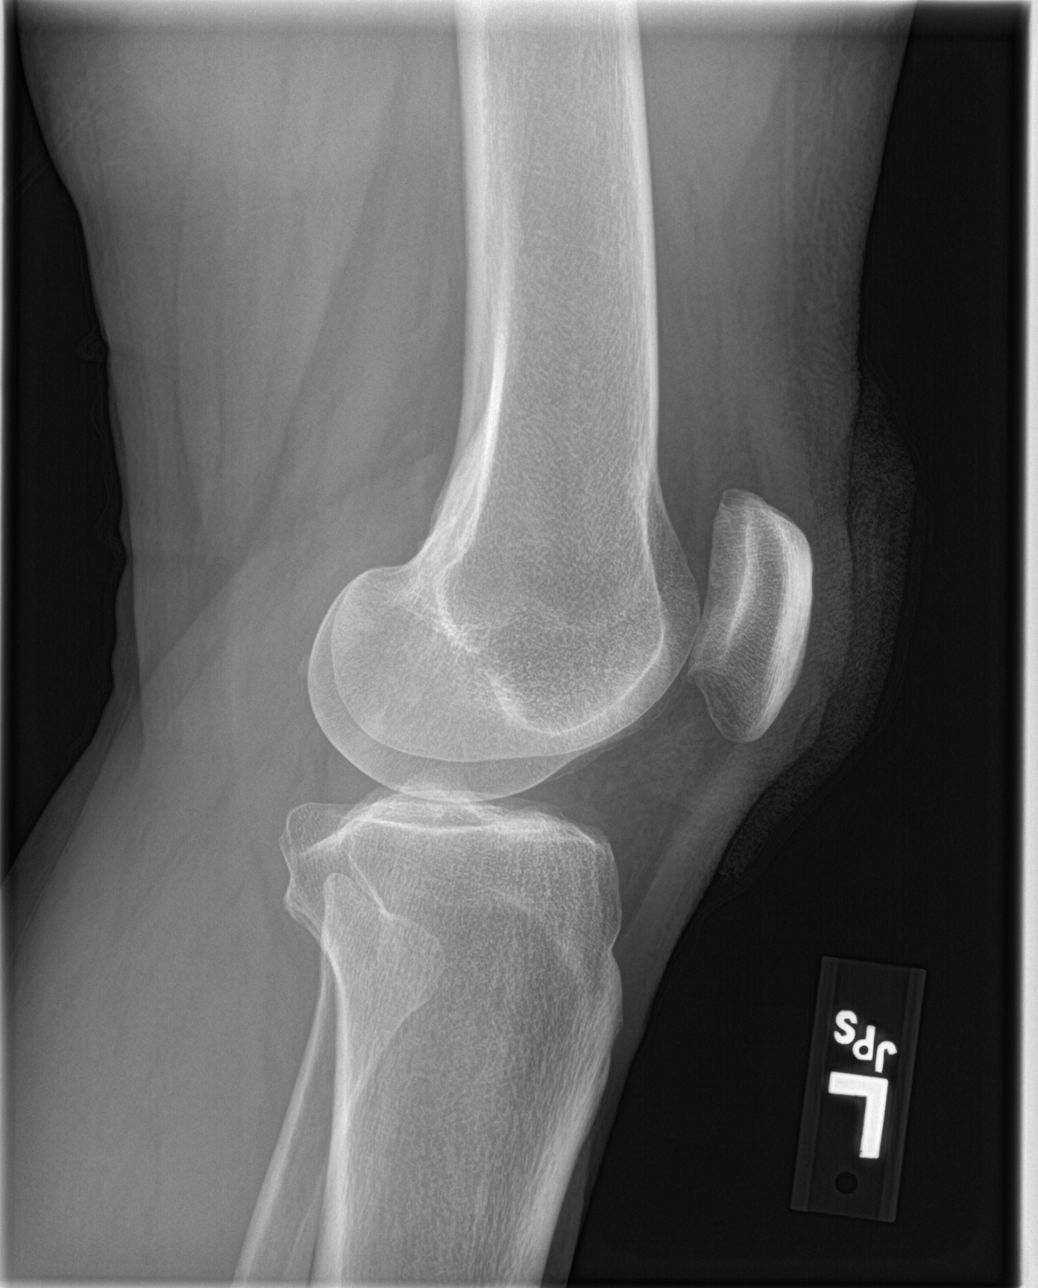

[knee obl (1 of 2)]
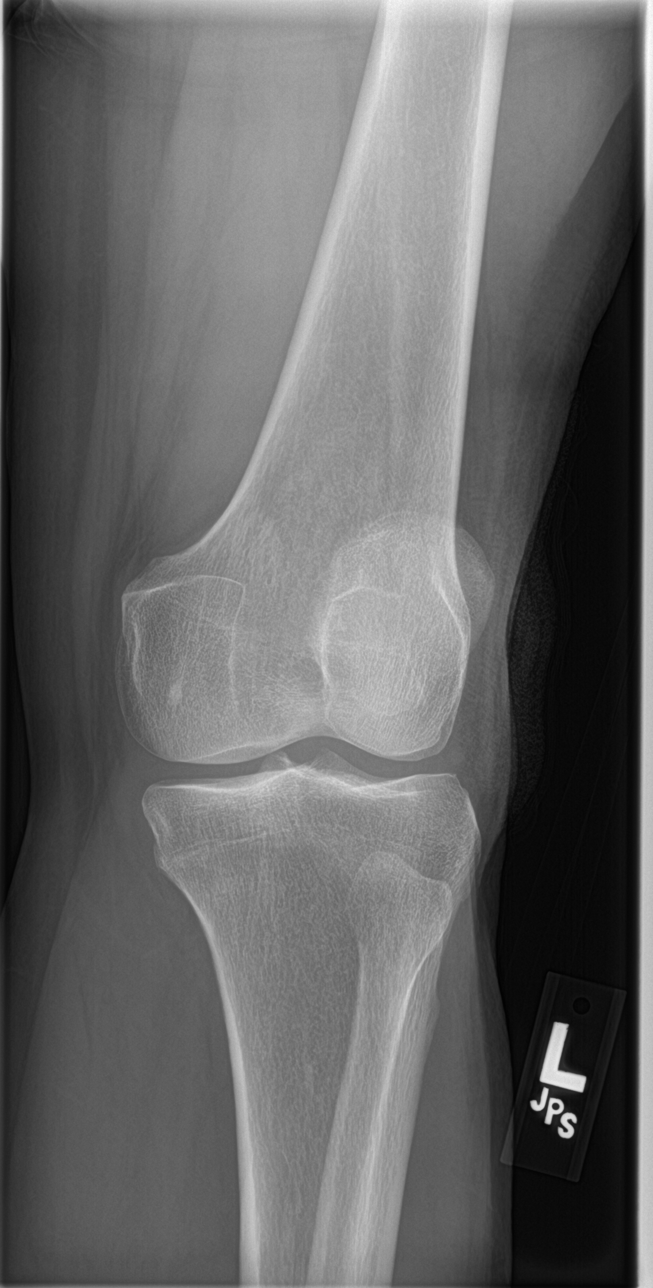

[knee obl (2 of 2)]
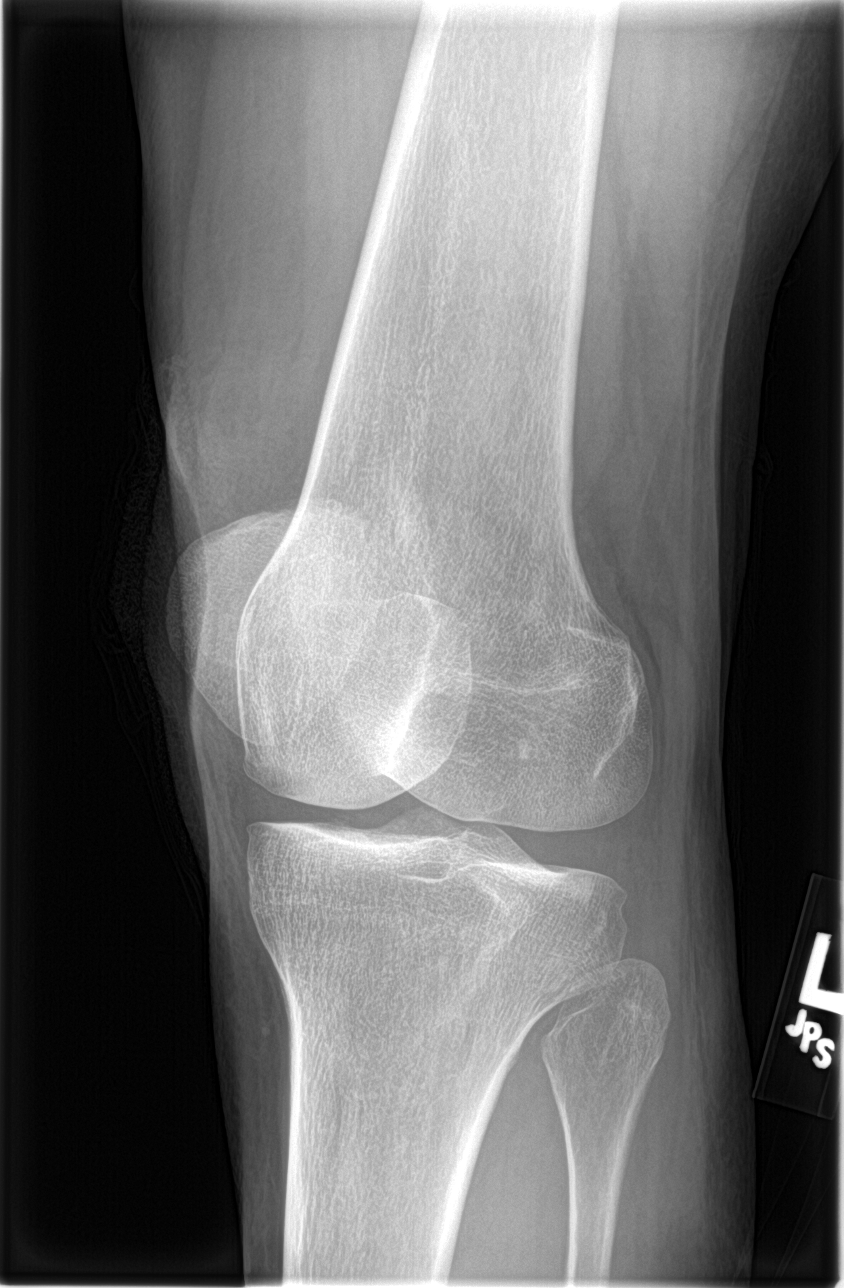

[4 of 4 positions shown; findings below may reference images not displayed]

FINDINGS: Normal anatomic alignment. No evidence for acute fracture or
dislocation. Bandaging material and soft tissue swelling overlying
the patella.
IMPRESSION: No acute fracture.

Soft tissue injury overlying the patella.

## 2019-02-19 ENCOUNTER — Other Ambulatory Visit: Payer: Self-pay | Admitting: Internal Medicine

## 2019-02-19 DIAGNOSIS — N6320 Unspecified lump in the left breast, unspecified quadrant: Secondary | ICD-10-CM

## 2019-03-06 ENCOUNTER — Ambulatory Visit: Payer: Medicaid Other

## 2019-03-06 ENCOUNTER — Ambulatory Visit
Admission: RE | Admit: 2019-03-06 | Discharge: 2019-03-06 | Disposition: A | Discharge status: Home / Self Care | Payer: Medicaid Other | Source: Ambulatory Visit | Attending: Internal Medicine | Admitting: Internal Medicine

## 2019-03-06 ENCOUNTER — Other Ambulatory Visit: Payer: Self-pay

## 2019-03-06 DIAGNOSIS — N6320 Unspecified lump in the left breast, unspecified quadrant: Secondary | ICD-10-CM

## 2019-10-10 DIAGNOSIS — S0990XA Unspecified injury of head, initial encounter: Secondary | ICD-10-CM

## 2019-10-10 DIAGNOSIS — Z87820 Personal history of traumatic brain injury: Secondary | ICD-10-CM

## 2019-10-10 HISTORY — DX: Unspecified injury of head, initial encounter: S09.90XA

## 2019-10-10 HISTORY — DX: Personal history of traumatic brain injury: Z87.820

## 2019-11-25 ENCOUNTER — Ambulatory Visit (INDEPENDENT_AMBULATORY_CARE_PROVIDER_SITE_OTHER): Payer: Medicaid Other | Admitting: Neurology

## 2019-11-25 ENCOUNTER — Other Ambulatory Visit: Payer: Self-pay

## 2019-11-25 DIAGNOSIS — M79641 Pain in right hand: Secondary | ICD-10-CM

## 2019-11-25 DIAGNOSIS — M79642 Pain in left hand: Secondary | ICD-10-CM

## 2019-11-25 DIAGNOSIS — G5603 Carpal tunnel syndrome, bilateral upper limbs: Secondary | ICD-10-CM

## 2019-11-25 NOTE — Procedures (Signed)
[facility]  [address] [place], [address]  [place], [place] [address] Tel: [phone] Fax:  [phone] Test Date:  11/25/2019  Patient: Carrie [name] DOB: October 19, 1970 Physician: [name], DO  Sex: Female Height: 5\' 9"  Ref Phys: , MD  ID#: Pati Gallo Temp: 35.0C Technician:    Patient Complaints: This is a 49 year old female referred for evaluation of bilateral hand pain and weakness.  NCV & EMG Findings: Extensive electrodiagnostic testing of the right upper extremity and additional studies of the left shows:  1. Bilateral median sensory responses show reduced amplitude (R19, L18.3.1 V).  Bilateral median/ulnar (palm) comparison nerves showed prolonged distal peak latency (Median Palm, R2.3, L2.3 ms).  Bilateral ulnar sensory responses are within normal limits. 2. Bilateral median and ulnar motor responses are within normal limits. 3. There is no evidence of active or chronic motor axonal loss changes affecting any of the tested muscles.  Motor unit configuration and recruitment pattern is within normal limits.  Impression: Bilateral median neuropathy at or distal to the wrist (mild), consistent with a clinical diagnosis of carpal tunnel syndrome.     ___________________________ 58, DO    Nerve Conduction Studies Anti Sensory Summary Table   Site NR Peak (ms) Norm Peak (ms) P-T Amp (V) Norm P-T Amp  Left Median Anti Sensory (2nd Digit)  35C  Wrist    3.4 <3.4 18.3 >20  Right Median Anti Sensory (2nd Digit)  35C  Wrist    3.1 <3.4 19.1 >20  Left Ulnar Anti Sensory (5th Digit)  35C  Wrist    3.0 <3.1 25.4 >12  Right Ulnar Anti Sensory (5th Digit)  35C  Wrist    3.0 <3.1 26.6 >12   Motor Summary Table   Site NR Onset (ms) Norm Onset (ms) O-P Amp (mV) Norm O-P Amp Site1 Site2 Delta-0 (ms) Dist (cm) Vel (m/s) Norm Vel (m/s)  Left Median Motor (Abd Poll Brev)  35C  Wrist    3.4 <3.9 12.4 >6 Elbow Wrist 5.1 28.0 55 >50  Elbow     8.5  11.4         Right Median Motor (Abd Poll Brev)  35C  Wrist    3.0 <3.9 12.0 >6 Elbow Wrist 5.1 29.0 57 >50  Elbow    8.1  11.3         Left Ulnar Motor (Abd Dig Minimi)  35C  Wrist    2.4 <3.1 8.8 >7 B Elbow Wrist 3.5 22.0 63 >50  B Elbow    5.9  8.6  A Elbow B Elbow 1.7 10.0 59 >50  A Elbow    7.6  8.1         Right Ulnar Motor (Abd Dig Minimi)  35C  Wrist    2.4 <3.1 8.4 >7 B Elbow Wrist 3.6 22.0 61 >50  B Elbow    6.0  8.0  A Elbow B Elbow 1.7 10.0 59 >50  A Elbow    7.7  7.8          Comparison Summary Table   Site NR Peak (ms) Norm Peak (ms) P-T Amp (V) Site1 Site2 Delta-P (ms) Norm Delta (ms)  Left Median/Ulnar Palm Comparison (Wrist - 8cm)  35C  Median Palm    2.3 <2.2 23.0 Median Palm Ulnar Palm 0.7   Ulnar Palm    1.6 <2.2 11.0      Right Median/Ulnar Palm Comparison (Wrist - 8cm)  35C  Median [name]  2.3 <2.2 26.2 Median Palm Ulnar Palm 0.3   Ulnar Palm    2.0 <2.2 7.2       EMG   Side Muscle Ins Act Fibs Psw Fasc Number Recrt Dur Dur. Amp Amp. Poly Poly. Comment  Right 1stDorInt Nml Nml Nml Nml Nml Nml Nml Nml Nml Nml Nml Nml N/A  Right Abd Poll Brev Nml Nml Nml Nml Nml Nml Nml Nml Nml Nml Nml Nml N/A  Right PronatorTeres Nml Nml Nml Nml Nml Nml Nml Nml Nml Nml Nml Nml N/A  Right Biceps Nml Nml Nml Nml Nml Nml Nml Nml Nml Nml Nml Nml N/A  Right Triceps Nml Nml Nml Nml Nml Nml Nml Nml Nml Nml Nml Nml N/A  Right Deltoid Nml Nml Nml Nml Nml Nml Nml Nml Nml Nml Nml Nml N/A  Left 1stDorInt Nml Nml Nml Nml Nml Nml Nml Nml Nml Nml Nml Nml N/A  Left Abd Poll Brev Nml Nml Nml Nml Nml Nml Nml Nml Nml Nml Nml Nml N/A  Left PronatorTeres Nml Nml Nml Nml Nml Nml Nml Nml Nml Nml Nml Nml N/A  Left Biceps Nml Nml Nml Nml Nml Nml Nml Nml Nml Nml Nml Nml N/A  Left Triceps Nml Nml Nml Nml Nml Nml Nml Nml Nml Nml Nml Nml N/A  Left Deltoid Nml Nml Nml Nml Nml Nml Nml Nml Nml Nml Nml Nml N/A      Waveforms:

## 2019-11-27 ENCOUNTER — Encounter (HOSPITAL_BASED_OUTPATIENT_CLINIC_OR_DEPARTMENT_OTHER): Payer: Self-pay | Admitting: Emergency Medicine

## 2019-11-27 ENCOUNTER — Other Ambulatory Visit: Payer: Self-pay

## 2019-11-27 ENCOUNTER — Emergency Department (HOSPITAL_BASED_OUTPATIENT_CLINIC_OR_DEPARTMENT_OTHER): Payer: Medicaid Other

## 2019-11-27 ENCOUNTER — Emergency Department (HOSPITAL_BASED_OUTPATIENT_CLINIC_OR_DEPARTMENT_OTHER)
Admission: EM | Admit: 2019-11-27 | Discharge: 2019-11-27 | Disposition: A | Discharge status: Home / Self Care | Payer: Medicaid Other | Attending: Emergency Medicine | Admitting: Emergency Medicine

## 2019-11-27 DIAGNOSIS — G8929 Other chronic pain: Secondary | ICD-10-CM | POA: Diagnosis not present

## 2019-11-27 DIAGNOSIS — E782 Mixed hyperlipidemia: Secondary | ICD-10-CM | POA: Insufficient documentation

## 2019-11-27 DIAGNOSIS — Z79899 Other long term (current) drug therapy: Secondary | ICD-10-CM | POA: Insufficient documentation

## 2019-11-27 DIAGNOSIS — Z87891 Personal history of nicotine dependence: Secondary | ICD-10-CM | POA: Insufficient documentation

## 2019-11-27 DIAGNOSIS — M542 Cervicalgia: Secondary | ICD-10-CM | POA: Diagnosis present

## 2019-11-27 DIAGNOSIS — M549 Dorsalgia, unspecified: Secondary | ICD-10-CM | POA: Diagnosis not present

## 2019-11-27 MED ORDER — METHOCARBAMOL 500 MG PO TABS: 500.0000 mg | ORAL_TABLET | Freq: Two times a day (BID) | ORAL | 0 refills | Status: AC

## 2019-11-27 MED ORDER — HYDROCODONE-ACETAMINOPHEN 5-325 MG PO TABS
1.0000 | ORAL_TABLET | Freq: Once | ORAL | Status: AC
  Administered 2019-11-27: 22:00:00 1 via ORAL
  Filled 2019-11-27: qty 1
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
## 2019-12-03 ENCOUNTER — Other Ambulatory Visit: Payer: Self-pay

## 2019-12-03 ENCOUNTER — Emergency Department (HOSPITAL_BASED_OUTPATIENT_CLINIC_OR_DEPARTMENT_OTHER)
Admission: EM | Admit: 2019-12-03 | Discharge: 2019-12-04 | Disposition: A | Discharge status: Home / Self Care | Payer: Medicaid Other | Attending: Emergency Medicine | Admitting: Emergency Medicine

## 2019-12-03 ENCOUNTER — Encounter (HOSPITAL_BASED_OUTPATIENT_CLINIC_OR_DEPARTMENT_OTHER): Payer: Self-pay | Admitting: Emergency Medicine

## 2019-12-03 DIAGNOSIS — Z79899 Other long term (current) drug therapy: Secondary | ICD-10-CM | POA: Insufficient documentation

## 2019-12-03 DIAGNOSIS — E782 Mixed hyperlipidemia: Secondary | ICD-10-CM | POA: Diagnosis not present

## 2019-12-03 DIAGNOSIS — Z87891 Personal history of nicotine dependence: Secondary | ICD-10-CM | POA: Insufficient documentation

## 2019-12-03 DIAGNOSIS — S39012D Strain of muscle, fascia and tendon of lower back, subsequent encounter: Secondary | ICD-10-CM | POA: Diagnosis not present
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
## 2019-12-10 ENCOUNTER — Emergency Department (HOSPITAL_BASED_OUTPATIENT_CLINIC_OR_DEPARTMENT_OTHER)
Admission: EM | Admit: 2019-12-10 | Discharge: 2019-12-10 | Disposition: A | Discharge status: Home / Self Care | Payer: Medicaid Other | Attending: Emergency Medicine | Admitting: Emergency Medicine

## 2019-12-10 ENCOUNTER — Encounter (HOSPITAL_BASED_OUTPATIENT_CLINIC_OR_DEPARTMENT_OTHER): Payer: Self-pay | Admitting: *Deleted

## 2019-12-10 ENCOUNTER — Other Ambulatory Visit: Payer: Self-pay

## 2019-12-10 DIAGNOSIS — M62838 Other muscle spasm: Secondary | ICD-10-CM | POA: Diagnosis not present

## 2019-12-10 DIAGNOSIS — S161XXD Strain of muscle, fascia and tendon at neck level, subsequent encounter: Secondary | ICD-10-CM | POA: Diagnosis not present

## 2019-12-10 DIAGNOSIS — Z79899 Other long term (current) drug therapy: Secondary | ICD-10-CM | POA: Diagnosis not present

## 2019-12-10 DIAGNOSIS — M542 Cervicalgia: Secondary | ICD-10-CM | POA: Diagnosis present

## 2019-12-10 DIAGNOSIS — E7800 Pure hypercholesterolemia, unspecified: Secondary | ICD-10-CM | POA: Insufficient documentation

## 2019-12-10 DIAGNOSIS — Z87891 Personal history of nicotine dependence: Secondary | ICD-10-CM | POA: Insufficient documentation

## 2019-12-10 DIAGNOSIS — S161XXA Strain of muscle, fascia and tendon at neck level, initial encounter: Secondary | ICD-10-CM

## 2019-12-10 MED ORDER — LIDOCAINE 5 % EX PTCH: 1.0000 | MEDICATED_PATCH | CUTANEOUS | 0 refills | Status: DC

## 2019-12-10 MED ORDER — DIAZEPAM 5 MG PO TABS: 5.0000 mg | ORAL_TABLET | Freq: Three times a day (TID) | ORAL | 0 refills | Status: DC | PRN

## 2019-12-10 MED ORDER — PREDNISONE 20 MG PO TABS: 40.0000 mg | ORAL_TABLET | Freq: Every day | ORAL | 0 refills | Status: DC

## 2019-12-10 MED FILL — diazePAM 5 MG TABS: 5 | 5 days supply | Qty: 15 | Fill #0

## 2019-12-10 MED FILL — predniSONE 20 MG TABS: 20 | 5 days supply | Qty: 10 | Fill #0

## 2019-12-10 NOTE — ED Notes (Signed)
ED Provider at bedside. 
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
## 2019-12-10 NOTE — Discharge Instructions (Addendum)
If you are taking the Valium for muscle spasm do not take Xanax.  Continue to use Tylenol and you can also use 400 mg of ibuprofen together with the Tylenol at the same time for pain control.  Also we will try the prednisone and Lidoderm patches to hopefully help her pain
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
## 2020-01-23 ENCOUNTER — Ambulatory Visit: Payer: Medicaid Other

## 2020-02-13 ENCOUNTER — Ambulatory Visit: Payer: No Typology Code available for payment source

## 2020-02-26 ENCOUNTER — Ambulatory Visit: Payer: Medicaid Other | Attending: Orthopedic Surgery

## 2020-02-26 ENCOUNTER — Other Ambulatory Visit: Payer: Self-pay

## 2020-02-26 DIAGNOSIS — M5386 Other specified dorsopathies, lumbar region: Secondary | ICD-10-CM | POA: Diagnosis present

## 2020-02-26 DIAGNOSIS — G8929 Other chronic pain: Secondary | ICD-10-CM | POA: Diagnosis present

## 2020-02-26 DIAGNOSIS — M545 Low back pain, unspecified: Secondary | ICD-10-CM

## 2020-02-26 DIAGNOSIS — R2689 Other abnormalities of gait and mobility: Secondary | ICD-10-CM | POA: Insufficient documentation
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
## 2020-04-26 ENCOUNTER — Encounter: Payer: Self-pay | Admitting: Physician Assistant

## 2020-06-02 ENCOUNTER — Ambulatory Visit: Payer: Medicaid Other | Admitting: Physician Assistant

## 2020-07-08 ENCOUNTER — Ambulatory Visit: Payer: Medicaid Other | Admitting: Physician Assistant

## 2020-12-18 ENCOUNTER — Emergency Department (HOSPITAL_BASED_OUTPATIENT_CLINIC_OR_DEPARTMENT_OTHER)
Admission: EM | Admit: 2020-12-18 | Discharge: 2020-12-18 | Disposition: A | Discharge status: Home / Self Care | Payer: Medicaid Other | Attending: Emergency Medicine | Admitting: Emergency Medicine

## 2020-12-18 ENCOUNTER — Encounter (HOSPITAL_BASED_OUTPATIENT_CLINIC_OR_DEPARTMENT_OTHER): Payer: Self-pay | Admitting: Emergency Medicine

## 2020-12-18 DIAGNOSIS — J029 Acute pharyngitis, unspecified: Secondary | ICD-10-CM | POA: Insufficient documentation

## 2020-12-18 DIAGNOSIS — Z87891 Personal history of nicotine dependence: Secondary | ICD-10-CM | POA: Diagnosis not present

## 2020-12-18 MED ORDER — DEXAMETHASONE 6 MG PO TABS
10.0000 mg | ORAL_TABLET | Freq: Once | ORAL | Status: AC
  Administered 2020-12-18: 10 mg via ORAL
  Filled 2020-12-18: qty 1

## 2020-12-18 MED ORDER — CLINDAMYCIN HCL 300 MG PO CAPS: 300.0000 mg | ORAL_CAPSULE | Freq: Four times a day (QID) | ORAL | 0 refills | Status: DC

## 2020-12-18 MED ORDER — CLINDAMYCIN HCL 300 MG PO CAPS: 300.0000 mg | ORAL_CAPSULE | Freq: Four times a day (QID) | ORAL | 0 refills | Status: AC

## 2020-12-18 NOTE — ED Provider Notes (Signed)
[facility] EMERGENCY DEPARTMENT Provider Note   CSN: [id number] Arrival date & time: 12/18/20  0033     History Chief Complaint  Patient presents with  . Sore Throat    Carrie [name] is a 50 y.o. female.  The history is provided by the patient.  Sore Throat This is a new problem. The current episode started more than 2 days ago. The problem occurs daily. The problem has not changed since onset.Pertinent negatives include no chest pain, no abdominal pain, no headaches and no shortness of breath. Nothing aggravates the symptoms. Nothing relieves the symptoms. She has tried nothing for the symptoms. The treatment provided no relief.       Past Medical History:  Diagnosis Date  . Chronic back pain   . Hypercholesteremia   . Migraine     Patient Active Problem List   Diagnosis Date Noted  . Pelvic pain 01/04/2017  . Uterine prolapse 01/04/2017  . Bipolar disorder ([place]) 04/21/2014  . Chronic low back pain 04/21/2014  . Screening 04/21/2014  . Morning stiffness of joints 04/21/2014  . Chronic neck pain 09/15/2013    Past Surgical History:  Procedure Laterality Date  . BREAST BIOPSY Right 10/31/2016  . BREAST EXCISIONAL BIOPSY Right 2007  . CESAREAN SECTION    . DILATION AND CURETTAGE OF UTERUS       OB History    Gravida  7   Para  5   Term  5   Preterm      AB  2   Living  5     SAB  1   IAB      Ectopic  1   Multiple      Live Births  5           Family History  Problem Relation Age of Onset  . Cancer Mother        breast cancer   . Multiple sclerosis Mother   . Thyroid disease Mother   . Cancer Maternal Grandmother   . Breast cancer Maternal Grandmother     Social History   Tobacco Use  . Smoking status: Former Research scientist (life sciences)  . Smokeless tobacco: Current User  Vaping Use  . Vaping Use: Every day  . Substances: Nicotine, Flavoring  Substance Use Topics  . Alcohol use: No    Comment: OCCASIONAL  . Drug use: Yes     Types: Marijuana    Home Medications Prior to Admission medications   Medication Sig Start Date End Date Taking? Authorizing Provider  clindamycin (CLEOCIN) 300 MG capsule Take 1 capsule (300 mg total) by mouth 4 (four) times daily for 10 days. 12/18/20 12/28/20 Yes Marybel Alcott, DO  acetaminophen (TYLENOL) 500 MG tablet Take 1 tablet (500 mg total) by mouth every 6 (six) hours as needed. 10/31/17   Law, Bea Graff, PA-C  ALPRAZolam (XANAX) 0.25 MG tablet Take 1 tablet (0.25 mg total) by mouth 2 (two) times daily as needed for anxiety. 01/08/17   McVey, Gelene Mink, PA-C  butalbital-acetaminophen-caffeine (FIORICET) [phone] MG tablet  10/24/16   [provider]  conjugated estrogens (PREMARIN) vaginal cream Place 1 Applicatorful vaginally daily.    [provider]  diazepam (VALIUM) 5 MG tablet Take 1 tablet (5 mg total) by mouth every 8 (eight) hours as needed for muscle spasms. 12/10/19   Blanchie Dessert, MD  diclofenac Sodium (VOLTAREN) 1 % GEL Apply topically 4 (four) times daily.    [provider]  doxepin Milinda Cave)  25 MG capsule Take 1 capsule (25 mg total) by mouth at bedtime. 01/08/17   McVey, Gelene Mink, PA-C  doxepin (SINEQUAN) 25 MG capsule Take by mouth. 10/19/16   [provider]  FLUoxetine (PROZAC) 20 MG capsule Take 1 capsule (20 mg total) by mouth 2 (two) times daily. 01/08/17   McVey, Gelene Mink, PA-C  gabapentin (NEURONTIN) 400 MG capsule Take 400 mg by mouth 3 (three) times daily. 11/23/19   [provider]  ibuprofen (ADVIL,MOTRIN) 800 MG tablet Take 1 tablet (800 mg total) by mouth 3 (three) times daily. 03/05/18   McDonald, Mia A, PA-C  lamoTRIgine (LAMICTAL) 100 MG tablet Take 0.5 tablets (50 mg total) by mouth daily. 01/08/17   McVey, Gelene Mink, PA-C  lidocaine (LIDODERM) 5 % Place 1 patch onto the skin daily. Remove & Discard patch within 12 hours or as directed by MD 12/10/19   Blanchie Dessert, MD  [facility]  4 MG/0.1ML LIQD nasal spray kit  07/24/19   [provider]  ondansetron (ZOFRAN ODT) 4 MG disintegrating tablet Take 1 tablet (4 mg total) by mouth every 8 (eight) hours as needed for nausea or vomiting. 02/03/16   Joy, Shawn C, PA-C  oxyCODONE-acetaminophen (PERCOCET/ROXICET) 5-325 MG tablet SMARTSIG:1 Tablet(s) By Mouth Every 12 Hours 07/25/19   [provider]  predniSONE (DELTASONE) 20 MG tablet Take 2 tablets (40 mg total) by mouth daily. 12/10/19   Blanchie Dessert, MD  SUMAtriptan (IMITREX) 50 MG tablet Take 1 tablet (50 mg total) by mouth once for 1 dose. May repeat in 2 hours if headache persists or recurs. 09/25/17 09/25/17  McVey, Gelene Mink, PA-C  tiZANidine (ZANAFLEX) 4 MG tablet Take 4 mg by mouth 3 (three) times daily as needed. 11/07/19   [provider]    Allergies    Patient has no known allergies.  Review of Systems   Review of Systems  Constitutional: Negative for fever.  HENT: Positive for sore throat and trouble swallowing (pain). Negative for congestion, drooling, sinus pain and voice change.   Respiratory: Negative for shortness of breath.   Cardiovascular: Negative for chest pain.  Gastrointestinal: Negative for abdominal pain.  Neurological: Negative for headaches.    Physical Exam Updated Vital Signs BP 103/65 (BP Location: Left Arm)   Pulse (!) 57   Temp 98.3 F (36.8 C) (Oral)   Resp 14   Ht '5\' 8"'  (1.727 m)   Wt 71.7 kg   LMP 01/16/2017   SpO2 97%   BMI 24.02 kg/m   Physical Exam HENT:     Head: Normocephalic.     Nose: No congestion.     Mouth/Throat:     Mouth: Mucous membranes are moist.     Pharynx: Uvula midline. Posterior oropharyngeal erythema present.     Tonsils: Tonsillar exudate present. No tonsillar abscesses. 0 on the right. 0 on the left.  Musculoskeletal:     Cervical back: Normal range of motion and neck supple.  Lymphadenopathy:     Cervical: No cervical adenopathy.  Skin:    General: Skin  is warm.  Neurological:     Mental Status: She is alert.     ED Results / Procedures / Treatments   Labs (all labs ordered are listed, but only abnormal results are displayed) Labs Reviewed - No data to display  EKG None  Radiology No results found.  Procedures Procedures   Medications Ordered in ED Medications  dexamethasone (DECADRON) tablet 10 mg (has no administration in time  range)    ED Course  I have reviewed the triage vital signs and the nursing notes.  Pertinent labs & imaging results that were available during my care of the patient were reviewed by me and considered in my medical decision making (see chart for details).    MDM Rules/Calculators/A&P                          Carrie [name] is here with sore throat.  She appears to have pharyngitis on exam.  No concern for peritonsillar or retropharyngeal abscess.  Has exudates on the tonsils and uvula.  There is erythema.  Suspect viral versus strep pharyngitis.  Will empirically treat with clindamycin and Decadron.  Given return precautions and discharged in ED in good condition.  This chart was dictated using voice recognition software.  Despite best efforts to proofread,  errors can occur which can change the documentation meaning.   Final Clinical Impression(s) / ED Diagnoses Final diagnoses:  Pharyngitis, unspecified etiology    Rx / DC Orders ED Discharge Orders         Ordered    clindamycin (CLEOCIN) 300 MG capsule  4 times daily        12/18/20 [place], [place] Grant, DO 12/18/20 4920

## 2020-12-18 NOTE — ED Triage Notes (Signed)
Pt states she feels like she has a lump in her throat  Pt states it has been bothering her since the 9th  Pt states it hurts and feels difficult to swallow

## 2020-12-29 ENCOUNTER — Encounter (HOSPITAL_BASED_OUTPATIENT_CLINIC_OR_DEPARTMENT_OTHER): Payer: Self-pay | Admitting: Emergency Medicine

## 2020-12-29 ENCOUNTER — Other Ambulatory Visit: Payer: Self-pay

## 2020-12-29 ENCOUNTER — Emergency Department (HOSPITAL_BASED_OUTPATIENT_CLINIC_OR_DEPARTMENT_OTHER)
Admission: EM | Admit: 2020-12-29 | Discharge: 2020-12-29 | Disposition: A | Discharge status: Home / Self Care | Payer: Medicaid Other | Attending: Emergency Medicine | Admitting: Emergency Medicine

## 2020-12-29 DIAGNOSIS — Z87891 Personal history of nicotine dependence: Secondary | ICD-10-CM | POA: Insufficient documentation

## 2020-12-29 DIAGNOSIS — L988 Other specified disorders of the skin and subcutaneous tissue: Secondary | ICD-10-CM | POA: Diagnosis present

## 2020-12-29 DIAGNOSIS — B379 Candidiasis, unspecified: Secondary | ICD-10-CM | POA: Insufficient documentation

## 2020-12-29 DIAGNOSIS — K130 Diseases of lips: Secondary | ICD-10-CM

## 2020-12-29 MED ORDER — FLUCONAZOLE 150 MG PO TABS
150.0000 mg | ORAL_TABLET | Freq: Once | ORAL | Status: AC
  Administered 2020-12-29: 150 mg via ORAL
  Filled 2020-12-29: qty 1

## 2020-12-29 NOTE — ED Provider Notes (Signed)
[facility] EMERGENCY DEPARTMENT Provider Note   CSN: [id number] Arrival date & time: 12/29/20  8675     History Chief Complaint  Patient presents with  . multiple complaints    Carrie [name] is a 50 y.o. female.  HPI     This a 50 year old with a history of bipolar disorder and chronic back pain who presents with concerns for cracked lips.  Patient reports that she was recently treated for pharyngitis with clindamycin and Decadron.  Since that time she has noted that she has had significantly cracked lips.  Her pharyngitis is overall improved.  She finished clindamycin on Friday.  Additionally she states that she believes she is getting yeast infection.  She describes a thick vaginal discharge and itching.  This is consistent with her prior yeast infections.  Denies other concerns for STDs.  Has not had any ongoing fevers or other symptoms.  Of note on history taking, patient appears hypomanic.  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  She states she has not gotten much sleep in the last several days because the child has been congested and not sleeping well.  She reports that she has good support at home and has been taking her medications.  She states that her husband has been helping and she is going home today to sleep.  Past Medical History:  Diagnosis Date  . Chronic back pain   . Hypercholesteremia   . Migraine     Patient Active Problem List   Diagnosis Date Noted  . Pelvic pain 01/04/2017  . Uterine prolapse 01/04/2017  . B[place] disorder ([place] 04/21/2014  . Chronic low back pain 04/21/2014  . Screening 04/21/2014  . Morning stiffness of joints 04/21/2014  . Chronic neck pain 09/15/2013    Past Surgical History:  Procedure Laterality Date  . BREAST BIOPSY Right 10/31/2016  . BREAST EXCISIONAL BIOPSY Right 2007  . CESAREAN SECTION    . DILATION AND CURETTAGE OF UTERUS       OB History    Gravida  7   Para  5   Term  5    Preterm      AB  2   Living  5     SAB  1   IAB      Ectopic  1   Multiple      Live Births  5           Family History  Problem Relation Age of Onset  . Cancer Mother        breast cancer   . Multiple sclerosis Mother   . Thyroid disease Mother   . Cancer Maternal Grandmother   . Breast cancer Maternal Grandmother     Social History   Tobacco Use  . Smoking status: Former SResearch scientist (life sciences) . Smokeless tobacco: Current User  Vaping Use  . Vaping Use: Every day  . Substances: Nicotine, Flavoring  Substance Use Topics  . Alcohol use: No    Comment: OCCASIONAL  . Drug use: Yes    Types: Marijuana    Home Medications Prior to Admission medications   Medication Sig Start Date End Date Taking? Authorizing Provider  acetaminophen (TYLENOL) 500 MG tablet Take 1 tablet (500 mg total) by mouth every 6 (six) hours as needed. 10/31/17   Law, ABea Graff PA-C  ALPRAZolam (XANAX) 0.25 MG tablet Take 1 tablet (0.25 mg total) by mouth 2 (two) times daily as needed for anxiety. 01/08/17   McVey,  [address], PA-C  butalbital-acetaminophen-caffeine (FIORICET) [phone] MG tablet  10/24/16   [provider]  conjugated estrogens (PREMARIN) vaginal cream Place 1 Applicatorful vaginally daily.    [provider]  diazepam (VALIUM) 5 MG tablet Take 1 tablet (5 mg total) by mouth every 8 (eight) hours as needed for muscle spasms. 12/10/19   Blanchie Dessert, MD  diclofenac Sodium (VOLTAREN) 1 % GEL Apply topically 4 (four) times daily.    [provider]  doxepin (SINEQUAN) 25 MG capsule Take 1 capsule (25 mg total) by mouth at bedtime. 01/08/17   McVey, Gelene Mink, PA-C  doxepin (SINEQUAN) 25 MG capsule Take by mouth. 10/19/16   [provider]  FLUoxetine (PROZAC) 20 MG capsule Take 1 capsule (20 mg total) by mouth 2 (two) times daily. 01/08/17   McVey, Gelene Mink, PA-C  gabapentin (NEURONTIN) 400 MG capsule Take 400 mg by mouth 3 (three)  times daily. 11/23/19   [provider]  ibuprofen (ADVIL,MOTRIN) 800 MG tablet Take 1 tablet (800 mg total) by mouth 3 (three) times daily. 03/05/18   McDonald, Mia A, PA-C  lamoTRIgine (LAMICTAL) 100 MG tablet Take 0.5 tablets (50 mg total) by mouth daily. 01/08/17   McVey, Gelene Mink, PA-C  lamoTRIgine (LAMICTAL) 150 MG tablet Take 150 mg by mouth 2 (two) times daily. 11/16/20   [provider]  lidocaine (LIDODERM) 5 % Place 1 patch onto the skin daily. Remove & Discard patch within 12 hours or as directed by MD 12/10/19   Blanchie Dessert, MD  [facility] 4 MG/0.1ML LIQD nasal spray kit  07/24/19   [provider]  ondansetron (ZOFRAN ODT) 4 MG disintegrating tablet Take 1 tablet (4 mg total) by mouth every 8 (eight) hours as needed for nausea or vomiting. 02/03/16   Joy, Shawn C, PA-C  oxyCODONE-acetaminophen (PERCOCET/ROXICET) 5-325 MG tablet SMARTSIG:1 Tablet(s) By Mouth Every 12 Hours 07/25/19   [provider]  prazosin (MINIPRESS) 2 MG capsule Take 2 mg by mouth at bedtime. 11/16/20   [provider]  predniSONE (DELTASONE) 20 MG tablet Take 2 tablets (40 mg total) by mouth daily. 12/10/19   Blanchie Dessert, MD  SUMAtriptan (IMITREX) 50 MG tablet Take 1 tablet (50 mg total) by mouth once for 1 dose. May repeat in 2 hours if headache persists or recurs. 09/25/17 09/25/17  McVey, Gelene Mink, PA-C  tiZANidine (ZANAFLEX) 4 MG tablet Take 4 mg by mouth 3 (three) times daily as needed. 11/07/19   [provider]    Allergies    Patient has no known allergies.  Review of Systems   Review of Systems  Constitutional: Negative for fever.  HENT: Negative for mouth sores and sore throat.        Cracked lips  Cardiovascular: Negative for chest pain.  Gastrointestinal: Negative for abdominal pain, nausea and vomiting.  Genitourinary: Positive for vaginal discharge. Negative for dysuria and vaginal pain.  All other systems reviewed and are  negative.   Physical Exam Updated Vital Signs BP 118/82   Pulse 70   Temp 98.2 F (36.8 C) (Oral)   Resp 19   Ht 1.727 m ('5\' 8"' )   Wt 69.9 kg   LMP 01/16/2017   SpO2 98%   BMI 23.42 kg/m   Physical Exam Vitals and nursing note reviewed.  Constitutional:      Appearance: She is well-developed. She is not ill-appearing.  HENT:     Head: Normocephalic and atraumatic.     Nose: Nose normal.  Mouth/Throat:     Comments: Dry cracked lips specifically at the bilateral corners, no intraoral or mucosal lesions noted Eyes:     Pupils: Pupils are equal, round, and reactive to light.  Cardiovascular:     Rate and Rhythm: Normal rate and regular rhythm.  Pulmonary:     Effort: Pulmonary effort is normal. No respiratory distress.  Abdominal:     Palpations: Abdomen is soft.     Tenderness: There is no abdominal tenderness.  Genitourinary:    Comments: Deferred Musculoskeletal:     Cervical back: Neck supple.  Skin:    General: Skin is warm and dry.  Neurological:     Mental Status: She is alert and oriented to person, place, and time.  Psychiatric:     Comments: Pressured speech but cooperative and directable     ED Results / Procedures / Treatments   Labs (all labs ordered are listed, but only abnormal results are displayed) Labs Reviewed - No data to display  EKG None  Radiology No results found.  Procedures Procedures   Medications Ordered in ED Medications  fluconazole (DIFLUCAN) tablet 150 mg (has no administration in time range)    ED Course  I have reviewed the triage vital signs and the nursing notes.  Pertinent labs & imaging results that were available during my care of the patient were reviewed by me and considered in my medical decision making (see chart for details).    MDM Rules/Calculators/A&P                          Patient presents with cracked lips and concern for yeast infection.  She is overall nontoxic and vital signs are  reassuring.  She has no other intraoral lesions.  Lips just appear dry.  May be related to recent antibiotic use and pharyngitis; however, also could be related to hydration.  Recommend Vaseline or Aquaphor.  No indication of lesions concerning for skin infection such as Stevens-Johnson's.  Additionally, reports symptoms consistent with yeast infection.  She prefers to defer vaginal exam.  She was recently on a biotics.  She was given 1 dose of Diflucan.  I had a long discussion with the patient.  She reports recent poor sleep and appears hypomanic with some pressured speech.  However, she is cooperative and appears to be making good decisions.  She reports that she has a good support system at home and is taking her medications.  At this time do not feel she needs any psychiatric evaluation.  After history, exam, and medical workup I feel the patient has been appropriately medically screened and is safe for discharge home. Pertinent diagnoses were discussed with the patient. Patient was given return precautions.  Final Clinical Impression(s) / ED Diagnoses Final diagnoses:  Cracked lips  Yeast infection    Rx / DC Orders ED Discharge Orders    None       Horton, Barbette Hair, MD 12/29/20 [phone]

## 2020-12-29 NOTE — ED Notes (Signed)
See EDP assessment 

## 2020-12-29 NOTE — Discharge Instructions (Addendum)
You were seen today for cracking of your lips.  This is likely related to recent pharyngitis.  Apply Vaseline or Aquaphor to moisten.  Otherwise your infection appears to have cleared.  He also complained of yeast infection.  You were given Diflucan.  This is likely related to recent antibiotic use.

## 2020-12-29 NOTE — ED Triage Notes (Signed)
Pt arrive POV, reports being recently diganosed pharygnitis and finshed antibiotics "this weekend"; she now reports "I think I have a yeast infection" and that "it has traveled to my mouth and cracks the corners of my mouth"

## 2021-02-18 ENCOUNTER — Encounter (HOSPITAL_COMMUNITY): Payer: Self-pay | Admitting: Oral Surgery

## 2021-02-18 ENCOUNTER — Other Ambulatory Visit: Payer: Self-pay

## 2021-02-18 ENCOUNTER — Other Ambulatory Visit (HOSPITAL_COMMUNITY)
Admission: RE | Admit: 2021-02-18 | Discharge: 2021-02-18 | Disposition: A | Discharge status: Home / Self Care | Payer: Medicaid Other | Source: Ambulatory Visit | Attending: Oral Surgery | Admitting: Oral Surgery

## 2021-02-18 DIAGNOSIS — Z20822 Contact with and (suspected) exposure to covid-19: Secondary | ICD-10-CM | POA: Diagnosis not present

## 2021-02-18 DIAGNOSIS — Z01812 Encounter for preprocedural laboratory examination: Secondary | ICD-10-CM | POA: Insufficient documentation

## 2021-02-18 NOTE — Progress Notes (Addendum)
Mrs. Carrie [name] denies chest pain or shortness of breath.  Patient was tested for Covid today, she reports that there will be 5 nieces and nephews, plus her Mom and other relatives. Mrs Carrie [name] states that she could quarantine away from tem.  I asked the patient how likely it would be that she would not be in close contact with guest , she laughed. I informed Mrs  Carrie [name] that she may have to be retested on the morning of surgery, patient said that would be ok.  I instructed patient to stop Voltaren pills and gels an the Aleve.  Mrs. Carrie [name] reports that she has low blood pressure most of the time, patient's PCP is Dr. Fleet Contras.

## 2021-02-19 LAB — SARS CORONAVIRUS 2 (TAT 6-24 HRS): SARS Coronavirus 2: NEGATIVE

## 2021-02-22 ENCOUNTER — Ambulatory Visit (HOSPITAL_COMMUNITY)
Admission: RE | Admit: 2021-02-22 | Discharge: 2021-02-22 | Disposition: A | Discharge status: Home / Self Care | Payer: Medicaid Other | Attending: Oral Surgery | Admitting: Oral Surgery

## 2021-02-22 ENCOUNTER — Ambulatory Visit (HOSPITAL_COMMUNITY): Payer: Medicaid Other | Admitting: Anesthesiology

## 2021-02-22 ENCOUNTER — Encounter (HOSPITAL_COMMUNITY): Payer: Self-pay | Admitting: Oral Surgery

## 2021-02-22 ENCOUNTER — Encounter (HOSPITAL_COMMUNITY)
Admission: RE | Disposition: A | Discharge status: Home / Self Care | Payer: Self-pay | Source: Home / Self Care | Attending: Oral Surgery

## 2021-02-22 DIAGNOSIS — K0530 Chronic periodontitis, unspecified: Secondary | ICD-10-CM | POA: Insufficient documentation

## 2021-02-22 DIAGNOSIS — K029 Dental caries, unspecified: Secondary | ICD-10-CM | POA: Diagnosis not present

## 2021-02-22 DIAGNOSIS — Z79899 Other long term (current) drug therapy: Secondary | ICD-10-CM | POA: Insufficient documentation

## 2021-02-22 DIAGNOSIS — Z791 Long term (current) use of non-steroidal anti-inflammatories (NSAID): Secondary | ICD-10-CM | POA: Insufficient documentation

## 2021-02-22 DIAGNOSIS — Z87891 Personal history of nicotine dependence: Secondary | ICD-10-CM | POA: Insufficient documentation

## 2021-02-22 DIAGNOSIS — M270 Developmental disorders of jaws: Secondary | ICD-10-CM | POA: Insufficient documentation

## 2021-02-22 HISTORY — DX: Bipolar disorder, unspecified: F31.9

## 2021-02-22 HISTORY — DX: Pneumonia, unspecified organism: J18.9

## 2021-02-22 HISTORY — DX: Myoneural disorder, unspecified: G70.9

## 2021-02-22 HISTORY — DX: Unspecified osteoarthritis, unspecified site: M19.90

## 2021-02-22 HISTORY — PX: TOOTH EXTRACTION: SHX859

## 2021-02-22 HISTORY — DX: Personality disorder, unspecified: F60.9

## 2021-02-22 HISTORY — DX: Anxiety disorder, unspecified: F41.9

## 2021-02-22 HISTORY — DX: Depression, unspecified: F32.A

## 2021-02-22 HISTORY — DX: Post-traumatic stress disorder, unspecified: F43.10

## 2021-02-22 LAB — COMPREHENSIVE METABOLIC PANEL
ALT: 15 U/L (ref 0–44)
AST: 20 U/L (ref 15–41)
Albumin: 3.6 g/dL (ref 3.5–5.0)
Alkaline Phosphatase: 75 U/L (ref 38–126)
Anion gap: 9 (ref 5–15)
BUN: 5 mg/dL — ABNORMAL LOW (ref 6–20)
CO2: 23 mmol/L (ref 22–32)
Calcium: 9.1 mg/dL (ref 8.9–10.3)
Chloride: 106 mmol/L (ref 98–111)
Creatinine, Ser: 0.82 mg/dL (ref 0.44–1.00)
GFR, Estimated: >60 mL/min (ref >60)
Glucose, Bld: 98 mg/dL (ref 70–99)
Potassium: 3.1 mmol/L — ABNORMAL LOW (ref 3.5–5.1)
Sodium: 138 mmol/L (ref 135–145)
Total Bilirubin: 0.4 mg/dL (ref 0.3–1.2)
Total Protein: 6.6 g/dL (ref 6.5–8.1)

## 2021-02-22 LAB — CBC
HCT: 40.7 % (ref 36.0–46.0)
Hemoglobin: 12.7 g/dL (ref 12.0–15.0)
MCH: 31.2 pg (ref 26.0–34.0)
MCHC: 31.2 g/dL (ref 30.0–36.0)
MCV: 100 fL (ref 80.0–100.0)
Platelets: 274 10*3/uL (ref 150–400)
RBC: 4.07 MIL/uL (ref 3.87–5.11)
RDW: 11.3 % — ABNORMAL LOW (ref 11.5–15.5)
WBC: 5.7 10*3/uL (ref 4.0–10.5)
nRBC: 0 % (ref 0.0–0.2)

## 2021-02-22 SURGERY — DENTAL RESTORATION/EXTRACTIONS
Anesthesia: General

## 2021-02-22 MED ORDER — DEXAMETHASONE SODIUM PHOSPHATE 10 MG/ML IJ SOLN
INTRAMUSCULAR | Status: AC
  Filled 2021-02-22: qty 1

## 2021-02-22 MED ORDER — PROPOFOL 10 MG/ML IV BOLUS
INTRAVENOUS | Status: DC | PRN
  Administered 2021-02-22: 200 mg via INTRAVENOUS

## 2021-02-22 MED ORDER — FENTANYL CITRATE (PF) 250 MCG/5ML IJ SOLN
INTRAMUSCULAR | Status: DC | PRN
  Administered 2021-02-22: 100 ug via INTRAVENOUS

## 2021-02-22 MED ORDER — LIDOCAINE 2% (20 MG/ML) 5 ML SYRINGE
INTRAMUSCULAR | Status: AC
  Filled 2021-02-22: qty 5

## 2021-02-22 MED ORDER — LIDOCAINE-EPINEPHRINE 2 %-1:100000 IJ SOLN
INTRAMUSCULAR | Status: AC
  Filled 2021-02-22: qty 1

## 2021-02-22 MED ORDER — MIDAZOLAM HCL 2 MG/2ML IJ SOLN
INTRAMUSCULAR | Status: AC
  Filled 2021-02-22: qty 2

## 2021-02-22 MED ORDER — LACTATED RINGERS IV SOLN: INTRAVENOUS | Status: DC

## 2021-02-22 MED ORDER — PROPOFOL 10 MG/ML IV BOLUS
INTRAVENOUS | Status: AC
  Filled 2021-02-22: qty 20

## 2021-02-22 MED ORDER — ROCURONIUM BROMIDE 10 MG/ML (PF) SYRINGE
PREFILLED_SYRINGE | INTRAVENOUS | Status: AC
  Filled 2021-02-22: qty 10

## 2021-02-22 MED ORDER — AMOXICILLIN 500 MG PO CAPS: 500.0000 mg | ORAL_CAPSULE | Freq: Three times a day (TID) | ORAL | 0 refills | Status: DC

## 2021-02-22 MED ORDER — 0.9 % SODIUM CHLORIDE (POUR BTL) OPTIME
TOPICAL | Status: DC | PRN
  Administered 2021-02-22: 1000 mL

## 2021-02-22 MED ORDER — ONDANSETRON HCL 4 MG/2ML IJ SOLN
INTRAMUSCULAR | Status: AC
  Filled 2021-02-22: qty 2

## 2021-02-22 MED ORDER — OXYMETAZOLINE HCL 0.05 % NA SOLN
NASAL | Status: AC
  Filled 2021-02-22: qty 30

## 2021-02-22 MED ORDER — FENTANYL CITRATE (PF) 100 MCG/2ML IJ SOLN
25.0000 ug | INTRAMUSCULAR | Status: DC | PRN
  Administered 2021-02-22 (×2): 25 ug via INTRAVENOUS
  Administered 2021-02-22: 50 ug via INTRAVENOUS

## 2021-02-22 MED ORDER — SODIUM CHLORIDE 0.9 % IR SOLN
Status: DC | PRN
  Administered 2021-02-22: 1000 mL

## 2021-02-22 MED ORDER — OXYCODONE-ACETAMINOPHEN 5-325 MG PO TABS: 1.0000 | ORAL_TABLET | ORAL | 0 refills | Status: DC | PRN

## 2021-02-22 MED ORDER — GLYCOPYRROLATE PF 0.2 MG/ML IJ SOSY
PREFILLED_SYRINGE | INTRAMUSCULAR | Status: AC
  Filled 2021-02-22: qty 1

## 2021-02-22 MED ORDER — ONDANSETRON HCL 4 MG/2ML IJ SOLN
INTRAMUSCULAR | Status: DC | PRN
  Administered 2021-02-22: 4 mg via INTRAVENOUS

## 2021-02-22 MED ORDER — PHENYLEPHRINE 40 MCG/ML (10ML) SYRINGE FOR IV PUSH (FOR BLOOD PRESSURE SUPPORT)
PREFILLED_SYRINGE | INTRAVENOUS | Status: DC | PRN
  Administered 2021-02-22: 80 ug via INTRAVENOUS

## 2021-02-22 MED ORDER — OXYMETAZOLINE HCL 0.05 % NA SOLN
NASAL | Status: DC | PRN
  Administered 2021-02-22 (×2): 2 via NASAL

## 2021-02-22 MED ORDER — ROCURONIUM BROMIDE 10 MG/ML (PF) SYRINGE
PREFILLED_SYRINGE | INTRAVENOUS | Status: DC | PRN
  Administered 2021-02-22: 40 mg via INTRAVENOUS

## 2021-02-22 MED ORDER — DEXAMETHASONE SODIUM PHOSPHATE 10 MG/ML IJ SOLN
INTRAMUSCULAR | Status: DC | PRN
  Administered 2021-02-22: 10 mg via INTRAVENOUS

## 2021-02-22 MED ORDER — GABAPENTIN 300 MG PO CAPS
300.0000 mg | ORAL_CAPSULE | Freq: Once | ORAL | Status: AC
  Administered 2021-02-22: 300 mg via ORAL
  Filled 2021-02-22: qty 1

## 2021-02-22 MED ORDER — FENTANYL CITRATE (PF) 250 MCG/5ML IJ SOLN
INTRAMUSCULAR | Status: AC
  Filled 2021-02-22: qty 5

## 2021-02-22 MED ORDER — CHLORHEXIDINE GLUCONATE 0.12 % MT SOLN
OROMUCOSAL | Status: AC
  Filled 2021-02-22: qty 15

## 2021-02-22 MED ORDER — PROMETHAZINE HCL 25 MG/ML IJ SOLN: 6.2500 mg | INTRAMUSCULAR | Status: DC | PRN

## 2021-02-22 MED ORDER — ORAL CARE MOUTH RINSE: 15.0000 mL | Freq: Once | OROMUCOSAL | Status: AC

## 2021-02-22 MED ORDER — FENTANYL CITRATE (PF) 100 MCG/2ML IJ SOLN
INTRAMUSCULAR | Status: AC
  Filled 2021-02-22: qty 2

## 2021-02-22 MED ORDER — OXYMETAZOLINE HCL 0.05 % NA SOLN
NASAL | Status: DC | PRN
  Administered 2021-02-22: 1

## 2021-02-22 MED ORDER — LIDOCAINE-EPINEPHRINE 2 %-1:100000 IJ SOLN
INTRAMUSCULAR | Status: DC | PRN
  Administered 2021-02-22: 13 mL via INTRADERMAL

## 2021-02-22 MED ORDER — ACETAMINOPHEN 500 MG PO TABS
1000.0000 mg | ORAL_TABLET | Freq: Once | ORAL | Status: AC
  Administered 2021-02-22: 1000 mg via ORAL
  Filled 2021-02-22: qty 2

## 2021-02-22 MED ORDER — CHLORHEXIDINE GLUCONATE 0.12 % MT SOLN
15.0000 mL | Freq: Once | OROMUCOSAL | Status: AC
  Administered 2021-02-22: 15 mL via OROMUCOSAL
  Filled 2021-02-22: qty 15

## 2021-02-22 MED ORDER — CEFAZOLIN SODIUM-DEXTROSE 2-4 GM/100ML-% IV SOLN
2.0000 g | INTRAVENOUS | Status: AC
  Administered 2021-02-22: 2 g via INTRAVENOUS
  Filled 2021-02-22: qty 100

## 2021-02-22 MED ORDER — LIDOCAINE 2% (20 MG/ML) 5 ML SYRINGE
INTRAMUSCULAR | Status: DC | PRN
  Administered 2021-02-22: 60 mg via INTRAVENOUS

## 2021-02-22 MED ORDER — GLYCOPYRROLATE PF 0.2 MG/ML IJ SOSY
PREFILLED_SYRINGE | INTRAMUSCULAR | Status: DC | PRN
  Administered 2021-02-22: .2 mg via INTRAVENOUS

## 2021-02-22 MED ORDER — MIDAZOLAM HCL 2 MG/2ML IJ SOLN
INTRAMUSCULAR | Status: DC | PRN
  Administered 2021-02-22: 2 mg via INTRAVENOUS

## 2021-02-22 MED ORDER — SUGAMMADEX SODIUM 200 MG/2ML IV SOLN
INTRAVENOUS | Status: DC | PRN
  Administered 2021-02-22: 150 mg via INTRAVENOUS

## 2021-02-22 SURGICAL SUPPLY — 37 items
BLADE 15 SAFETY STRL DISP (BLADE) ×1 IMPLANT
BLADE SURG 15 STRL LF DISP TIS (BLADE) ×1 IMPLANT
BLADE SURG 15 STRL SS (BLADE) ×2
BUR CROSS CUT FISSURE 1.6 (BURR) ×3 IMPLANT
BUR EGG ELITE 4.0 (BURR) ×1 IMPLANT
CANISTER SUCT 3000ML PPV (MISCELLANEOUS) ×2 IMPLANT
COVER SURGICAL LIGHT HANDLE (MISCELLANEOUS) ×1 IMPLANT
DECANTER SPIKE VIAL GLASS SM (MISCELLANEOUS) ×3 IMPLANT
DRAPE U-SHAPE 76X120 STRL (DRAPES) ×2 IMPLANT
GAUZE PACKING FOLDED 1IN STRL (GAUZE/BANDAGES/DRESSINGS) ×1 IMPLANT
GAUZE PACKING FOLDED 2  STR (GAUZE/BANDAGES/DRESSINGS) ×2
GAUZE PACKING FOLDED 2 STR (GAUZE/BANDAGES/DRESSINGS) ×1 IMPLANT
GLOVE BIO SURGEON STRL SZ 6.5 (GLOVE) IMPLANT
GLOVE BIO SURGEON STRL SZ7 (GLOVE) IMPLANT
GLOVE BIO SURGEON STRL SZ8 (GLOVE) ×2 IMPLANT
GLOVE SURG UNDER POLY LF SZ6.5 (GLOVE) IMPLANT
GLOVE SURG UNDER POLY LF SZ7 (GLOVE) IMPLANT
GOWN STRL REUS W/ TWL LRG LVL3 (GOWN DISPOSABLE) ×1 IMPLANT
GOWN STRL REUS W/ TWL XL LVL3 (GOWN DISPOSABLE) ×1 IMPLANT
GOWN STRL REUS W/TWL LRG LVL3 (GOWN DISPOSABLE) ×2
GOWN STRL REUS W/TWL XL LVL3 (GOWN DISPOSABLE) ×2
IV NS 1000ML (IV SOLUTION) ×2
IV NS 1000ML BAXH (IV SOLUTION) ×1 IMPLANT
KIT BASIN OR (CUSTOM PROCEDURE TRAY) ×2 IMPLANT
KIT TURNOVER KIT B (KITS) ×2 IMPLANT
NDL HYPO 25GX1X1/2 BEV (NEEDLE) ×2 IMPLANT
NEEDLE HYPO 25GX1X1/2 BEV (NEEDLE) ×4 IMPLANT
NS IRRIG 1000ML POUR BTL (IV SOLUTION) ×2 IMPLANT
PAD ARMBOARD 7.5X6 YLW CONV (MISCELLANEOUS) ×2 IMPLANT
SLEEVE IRRIGATION ELITE 7 (MISCELLANEOUS) ×4 IMPLANT
SPONGE SURGIFOAM ABS GEL 12-7 (HEMOSTASIS) IMPLANT
SUT CHROMIC 3 0 PS 2 (SUTURE) ×4 IMPLANT
SYR BULB IRRIG 60ML STRL (SYRINGE) ×2 IMPLANT
SYR CONTROL 10ML LL (SYRINGE) ×3 IMPLANT
TRAY ENT MC OR (CUSTOM PROCEDURE TRAY) ×2 IMPLANT
TUBING IRRIGATION (MISCELLANEOUS) ×2 IMPLANT
YANKAUER SUCT BULB TIP NO VENT (SUCTIONS) ×2 IMPLANT

## 2021-02-22 NOTE — H&P (Signed)
H&P documentation  -History and Physical Reviewed  -Patient has been re-examined  -No change in the plan of care  Carrie [name]  

## 2021-02-22 NOTE — Transfer of Care (Signed)
Immediate Anesthesia Transfer of Care Note  Patient: Carrie [name]  Procedure(s) Performed: DENTAL RESTORATION/EXTRACTION OF TEETH NUMBER SIX, SEVEN, EIGHT, NINE, TEN, ELEVEN, TWENTY-ONE, TWENTY-TWO, TWENTY-THREE, TWENTY-FOUR, TWENTY-FIVE, TWENTY-SIX, TWENTY-SEVEN, TWENTY-EIGHT WITH ALVEOLOPLASTY AND REMOVAL OF MANDIBULAR LINGUAL TORI (N/A )  Patient Location: PACU  Anesthesia Type:General  Level of Consciousness: awake, alert  and patient cooperative  Airway & Oxygen Therapy: Patient Spontanous Breathing and Patient connected to nasal cannula oxygen  Post-op Assessment: Report given to RN and Post -op Vital signs reviewed and stable  Post vital signs: Reviewed and stable  Last Vitals:  Vitals Value Taken Time  BP 133/77 02/22/21 1151  Temp    Pulse    Resp 19 02/22/21 1151  SpO2    Vitals shown include unvalidated device data.  Last Pain:  Vitals:   02/22/21 0952  TempSrc: Oral  PainSc:          Complications: No complications documented.

## 2021-02-22 NOTE — Anesthesia Procedure Notes (Signed)
Procedure Name: Intubation Date/Time: 02/22/2021 10:45 AM Performed by: Shary Decamp, CRNA Pre-anesthesia Checklist: Patient identified, Emergency Drugs available, Suction available and Patient being monitored Patient Re-evaluated:Patient Re-evaluated prior to induction Oxygen Delivery Method: Circle system utilized Preoxygenation: Pre-oxygenation with 100% oxygen Induction Type: IV induction Ventilation: Mask ventilation without difficulty Laryngoscope Size: Miller and 2 Nasal Tubes: Nasal prep performed and Nasal Rae Tube size: 7.0 mm Placement Confirmation: ETT inserted through vocal cords under direct vision,  positive ETCO2 and breath sounds checked- equal and bilateral Tube secured with: Tape Dental Injury: Teeth and Oropharynx as per pre-operative assessment

## 2021-02-22 NOTE — H&P (Signed)
HISTORY AND PHYSICAL  Carrie [name] is a 50 y.o. female patient with CC: Painful teeth.Cant eat well. No back teeth.    No diagnosis found.  Past Medical History:  Diagnosis Date  . Anxiety   . Arthritis   . Bipolar disorder ([facility])   . Chronic back pain   . Complication of anesthesia 2008   woke up during D/C x 2 same year - during D/C  . Depression   . Head injury 2021   concussion - fell at home  . Hypercholesteremia   . Migraine   . Neuromuscular disorder ([facility])    " fracure to L3"  . Personality disorder ([facility])   . Pneumonia   . PTSD (post-traumatic stress disorder)     No current facility-administered medications for this encounter.   Current Outpatient Medications  Medication Sig Dispense Refill  . acetaminophen (TYLENOL) 500 MG tablet Take 1 tablet (500 mg total) by mouth every 6 (six) hours as needed. (Patient taking differently: Take 1,000 mg by mouth every 6 (six) hours as needed for moderate pain.) 30 tablet 0  . ALPRAZolam (XANAX) 0.5 MG tablet Take 0.5 mg by mouth 3 (three) times daily as needed for anxiety.    . butalbital-acetaminophen-caffeine (FIORICET) 50-325-40 MG tablet Take 1 tablet by mouth every 6 (six) hours as needed for migraine.    . diclofenac (VOLTAREN) 75 MG EC tablet Take 75 mg by mouth 2 (two) times daily.    . diclofenac Sodium (VOLTAREN) 1 % GEL Apply 1 application topically 4 (four) times daily as needed (pain).    Marland Kitchen doxepin (SINEQUAN) 25 MG capsule Take 1 capsule (25 mg total) by mouth at bedtime. (Patient taking differently: Take 25 mg by mouth at bedtime as needed (anxiety).) 30 capsule 2  . ECHINACEA PO Take 1 capsule by mouth daily.    Marland Kitchen FLUoxetine (PROZAC) 40 MG capsule Take 40 mg by mouth daily.    . folic acid (FOLVITE) 1 MG tablet Take 1 mg by mouth daily.    Marland Kitchen gabapentin (NEURONTIN) 400 MG capsule Take 400 mg by mouth 3 (three) times daily.    Marland Kitchen lamoTRIgine (LAMICTAL) 150 MG tablet Take 150 mg by mouth 2 (two) times daily.    .  naproxen sodium (ALEVE) 220 MG tablet Take 440 mg by mouth 2 (two) times daily as needed (pain).    . prazosin (MINIPRESS) 2 MG capsule Take 2 mg by mouth at bedtime.    . SUMAtriptan (IMITREX) 50 MG tablet Take 1 tablet (50 mg total) by mouth once for 1 dose. May repeat in 2 hours if headache persists or recurs. (Patient taking differently: Take 50 mg by mouth every 2 (two) hours as needed for migraine. May repeat in 2 hours if headache persists or recurs.) 9 tablet 5  . tiZANidine (ZANAFLEX) 4 MG tablet Take 4 mg by mouth 3 (three) times daily.    Marland Kitchen FLUoxetine (PROZAC) 20 MG capsule Take 1 capsule (20 mg total) by mouth 2 (two) times daily. (Patient not taking: Reported on 02/15/2021) 60 capsule 2  . lidocaine (LIDODERM) 5 % Place 1 patch onto the skin daily. Remove & Discard patch within 12 hours or as directed by MD (Patient not taking: No sig reported) 30 patch 0   No Known Allergies Active Problems:   * No active hospital problems. *  Vitals: Last menstrual period 01/16/2017. Lab results:No results found for this or any previous visit (from the past 24 hour(s)). Radiology Results: No  results found. General appearance: alert, cooperative and no distress Head: Normocephalic, without obvious abnormality, atraumatic Eyes: negative Nose: Nares normal. Septum midline. Mucosa normal. No drainage or sinus tenderness. Throat: Teeth 6-11, 20-28 only teeth remaining. Bilateral mandibular lingual tori.          Neck: no adenopathy and supple, symmetrical, trachea midline Resp: clear to auscultation bilaterally Cardio: regular rate and rhythm, S1, S2 normal, no murmur, click, rub or gallop  Assessment: ASA 2. Non-restorable teeth 6, 7, 8, 9, 10, 11, 21, 22, 23, 24, 25, 26, 27, 28. Bilateral mandibular lingual tori.           Plan: Extraction Teeth #  6, 7, 8, 9, 10, 11, 21, 22, 23, 24, 25, 26, 27, 28, Alveoplasty. Removal bilateral mandibular lingual tori.  Hospital Day surgery.          Ocie Doyne 02/22/2021

## 2021-02-22 NOTE — Anesthesia Preprocedure Evaluation (Signed)
Anesthesia Evaluation  Patient identified by MRN, date of birth, ID band Patient awake    Reviewed: Allergy & Precautions, NPO status , Patient's Chart, lab work & pertinent test results  Airway Mallampati: II  TM Distance: >3 FB Neck ROM: Full    Dental  (+) Dental Advisory Given   Pulmonary former smoker,    breath sounds clear to auscultation       Cardiovascular negative cardio ROS   Rhythm:Regular     Neuro/Psych  Headaches,    GI/Hepatic negative GI ROS, Neg liver ROS,   Endo/Other  negative endocrine ROS  Renal/GU negative Renal ROS     Musculoskeletal  (+) Arthritis ,   Abdominal   Peds  Hematology negative hematology ROS (+)   Anesthesia Other Findings   Reproductive/Obstetrics                             Anesthesia Physical Anesthesia Plan  ASA: II  Anesthesia Plan: General   Post-op Pain Management:    Induction: Intravenous  PONV Risk Score and Plan: 3 and Midazolam, Dexamethasone, Ondansetron and Treatment may vary due to age or medical condition  Airway Management Planned: Nasal ETT  Additional Equipment:   Intra-op Plan:   Post-operative Plan: Extubation in OR  Informed Consent: I have reviewed the patients History and Physical, chart, labs and discussed the procedure including the risks, benefits and alternatives for the proposed anesthesia with the patient or authorized representative who has indicated his/her understanding and acceptance.     Dental advisory given  Plan Discussed with: CRNA  Anesthesia Plan Comments:         Anesthesia Quick Evaluation

## 2021-02-22 NOTE — Op Note (Signed)
02/22/2021  11:39 AM  PATIENT:  Carrie [name]  50 y.o. female  PRE-OPERATIVE DIAGNOSIS:  NON RESTORABLE TEETH # 6, 7, 8, 9, 10, 11, 21, 22, 23, 24, 25, 26, 27, 28, BILATERAL MANDIBULAR LINGUAL TORI.  POST-OPERATIVE DIAGNOSIS:  SAME  PROCEDURE:  Procedure(s): DENTAL EXTRACTIONS TEETH # 6, 7, 8, 9, 10, 11, 21, 22, 23, 24, 25, 26, 27, 28, ALVEOLOPLASTY RIGHT AND LEFT MAXILLA AND MANDIBLE, REMOVAL BILATERAL MANDIBULAR LINGUAL TORI.    SURGEON:  Surgeon(s): Ocie Doyne, DMD  ANESTHESIA:   local and general  EBL:  minimal  DRAINS: none   SPECIMEN:  No Specimen  COUNTS:  YES  PLAN OF CARE: Discharge to home after PACU  PATIENT DISPOSITION:  PACU - hemodynamically stable.   PROCEDURE DETAILS: Dictation #[id number]  Georgia Lopes, DMD 02/22/2021 11:39 AM

## 2021-02-23 ENCOUNTER — Encounter (HOSPITAL_COMMUNITY): Payer: Self-pay | Admitting: Oral Surgery

## 2021-02-23 NOTE — Op Note (Signed)
Carrie [name], GIANCOLA MEDICAL RECORD NO: [id number] ACCOUNT NO: [id number] DATE OF BIRTH: 1971/05/20 FACILITY: [facility] LOCATION: [facility] PHYSICIAN: Georgia Lopes, DDS  Operative Report   DATE OF PROCEDURE: 02/22/2021  PREOPERATIVE DIAGNOSES:  Nonrestorable teeth secondary to dental caries and periodontitis #6, 7, 8, 9. 10, 11, 21, 22, 23, 24, 25, 26, 27, 28 and bilateral mandibular lingual tori.  PROCEDURE:  Extraction of teeth #6, 7, 8, 9. 10, 11, 21, 22, 23, 24, 25, 26, 27, 28; alveoplasty right and left maxilla and mandible; removal of bilateral mandibular lingual tori. . SURGEON:  Georgia Lopes, DDS  ANESTHESIA:  General, nasal intubation, Dr. Marcene Duos attending.  DESCRIPTION OF PROCEDURE:  The patient was taken to the operating room and placed on the table in supine position.  General anesthesia was administered and nasoendotracheal tube was placed and secured.  The eyes were protected and the patient was draped  for surgery.  Timeout was performed.  The posterior pharynx was suctioned and a throat pack was placed. 2% lidocaine 1:100,000 epinephrine was infiltrated in an inferior alveolar block on the right and left sides and in buccal infiltration on the  anterior mandible and in the buccal and palatal mucosa of the upper jaw.  A total of 13 mL was administered.  A bite block was placed on the right side of the mouth, a sweetheart retractor was used to retract the tongue.  A #15 blade was used to make an  incision around teeth #21, 22, 23, 24, 25 and 26.  The periosteum was reflected from around these teeth.  The teeth were elevated ____.  Teeth #21, 22 and 23 were grasped by the Ash forceps, but the crown fractured upon attempted removal.  Teeth #24, 25  and 26 were removed with the dental forceps in a simple fashion. The periosteum was reflected further to expose the roots remaining from teeth numbers 21, 22 and 23.  Bone was removed circumferentially around the roots and  then the roots were removed  using the 301 elevator and rongeurs.  The sockets were then curetted.  The periosteum was reflected to expose the lingual torus.  This was removed using the egg-shaped bur under irrigation and the Stryker handpiece with a Seldin retractor used to protect  the lingual tissues.  Alveoplasty was then performed as the bone reduction to remove the roots caused sharp edges and irregular borders of the alveolus.  Then, the bone file was used to further smooth the areas and the left mandible was closed with 3-0  chromic.  Attention was turned to the left maxilla.  A 15 blade was used to make an incision around teeth #11, 10, 9, 8 and 7.  The periosteum was reflected from around these teeth.  The teeth were elevated with 301 elevator.  Teeth #10, 9, 8 and 7 were  removed in simple fashion with dental forceps.  Tooth #11 fractured upon attempted removal, necessitating incision proximally on the alveolar crest approximately 1 cm posterior to tooth #11.  The tissue was reflected and bone was removed around tooth #11  with a Stryker handpiece and the fissure bur under irrigation.  The root was difficult to remove necessitating removal of the excess bone.  The root was eventually removed using the rongeurs.  Then, the sockets were curetted.  The periosteum was  reflected to expose the alveolar crest.  Alveoplasty was performed using the egg bur followed by the bone file and then attention was turned to the  right mandible.  The 15 blade was used to make an incision around teeth #27 and 28 and around tooth #6 in  the right maxilla. The periosteum was reflected.  Tooth #28 was removed after elevating with 301 elevator using the Ash forceps.  Tooth #27 fractured upon attempted removal as did tooth #6.  Then, bone was removed around tooth #27 and 6 to expose the  roots and the roots were removed using the 301 elevator and rongeurs.  The incision in the mandible was carried forward distally on the  alveolar crest to the molar region and was reflected lingually to expose the lingual torus.  This was removed using  the egg bur under irrigation followed by the bone file.  Then, alveoplasty was performed in the right maxilla and right mandible with the egg bur followed by the bone file and the areas were irrigated and closed with 3-0 chromic.  The oral cavity was  then inspected and found to have good contour, hemostasis and closure.  The oral cavity was irrigated and suctioned.  The throat pack was removed.  The patient was left under the care of the anesthesia for extubation and transported to recovery room with  plans for discharge home through day surgery.  ESTIMATED BLOOD LOSS:  Minimal.  COMPLICATIONS:  None.  SPECIMENS:  None.   NIK D: 02/22/2021 11:46:09 am T: 02/23/2021 1:29:00 am  JOB: [id number]/ [id number]

## 2021-02-23 NOTE — Anesthesia Postprocedure Evaluation (Signed)
Anesthesia Post Note  Patient: Carrie [name]  Procedure(s) Performed: DENTAL RESTORATION/EXTRACTION OF TEETH NUMBER SIX, SEVEN, EIGHT, NINE, TEN, ELEVEN, TWENTY-ONE, TWENTY-TWO, TWENTY-THREE, TWENTY-FOUR, TWENTY-FIVE, TWENTY-SIX, TWENTY-SEVEN, TWENTY-EIGHT WITH ALVEOLOPLASTY AND REMOVAL OF MANDIBULAR LINGUAL TORI (N/A )     Patient location during evaluation: PACU Anesthesia Type: General Level of consciousness: awake and alert Pain management: pain level controlled Vital Signs Assessment: post-procedure vital signs reviewed and stable Respiratory status: spontaneous breathing, nonlabored ventilation, respiratory function stable and patient connected to nasal cannula oxygen Cardiovascular status: blood pressure returned to baseline and stable Postop Assessment: no apparent nausea or vomiting Anesthetic complications: no   No complications documented.  Last Vitals:  Vitals:   02/22/21 1306 02/22/21 1321  BP: 116/71 117/63  Pulse: (!) 59 (!) 54  Resp: (!) 9 16  Temp:  36.6 C  SpO2: 93% 94%    Last Pain:  Vitals:   02/22/21 1321  TempSrc:   PainSc: 3                  Kennieth Rad

## 2022-04-25 ENCOUNTER — Encounter (HOSPITAL_BASED_OUTPATIENT_CLINIC_OR_DEPARTMENT_OTHER): Payer: Self-pay | Admitting: Emergency Medicine

## 2022-04-25 ENCOUNTER — Other Ambulatory Visit: Payer: Self-pay

## 2022-04-25 DIAGNOSIS — L739 Follicular disorder, unspecified: Secondary | ICD-10-CM | POA: Insufficient documentation

## 2022-04-25 DIAGNOSIS — R21 Rash and other nonspecific skin eruption: Secondary | ICD-10-CM | POA: Diagnosis present

## 2022-04-25 NOTE — ED Triage Notes (Signed)
Pt woke up with insect bite on chin with swelling , also rash on right side of face due to a scratch.

## 2022-04-26 ENCOUNTER — Emergency Department (HOSPITAL_BASED_OUTPATIENT_CLINIC_OR_DEPARTMENT_OTHER)
Admission: EM | Admit: 2022-04-26 | Discharge: 2022-04-26 | Disposition: A | Discharge status: Home / Self Care | Payer: Medicaid Other | Attending: Emergency Medicine | Admitting: Emergency Medicine

## 2022-04-26 DIAGNOSIS — L739 Follicular disorder, unspecified: Secondary | ICD-10-CM

## 2022-04-26 MED ORDER — FLUCONAZOLE 150 MG PO TABS: 150.0000 mg | ORAL_TABLET | Freq: Every day | ORAL | 0 refills | Status: AC

## 2022-04-26 MED ORDER — DOXYCYCLINE HYCLATE 100 MG PO CAPS: 100.0000 mg | ORAL_CAPSULE | Freq: Two times a day (BID) | ORAL | 0 refills | Status: DC

## 2022-04-26 NOTE — ED Provider Notes (Signed)
[facility] EMERGENCY DEPARTMENT Provider Note  CSN: [id number] Arrival date & time: 04/25/22 2130  Chief Complaint(s) Insect Bite  HPI Carrie [name] is a 51 y.o. female    The history is provided by the patient.  Rash Location:  Face Facial rash location:  R cheek and chin Quality: itchiness and redness   Severity:  Moderate Onset quality:  Gradual Timing:  Constant Chronicity:  New Context comment:  Reports that she was scratched by a friend prior to rash showing up. Associated symptoms: induration   Associated symptoms: no fever, no headaches and no nausea    Reports prior h/o MRSA Past Medical History Past Medical History:  Diagnosis Date   Anxiety    Arthritis    Bipolar disorder (HCC)    Chronic back pain    Complication of anesthesia 2008   woke up during D/C x 2 same year - during D/C   Depression    Head injury 2021   concussion - fell at home   Hypercholesteremia    Migraine    Neuromuscular disorder (HCC)    " fracure to L3"   Personality disorder (HCC)    Pneumonia    PTSD (post-traumatic stress disorder)    Patient Active Problem List   Diagnosis Date Noted   Pelvic pain 01/04/2017   Uterine prolapse 01/04/2017   Bipolar disorder (HCC) 04/21/2014   Chronic low back pain 04/21/2014   Screening 04/21/2014   Morning stiffness of joints 04/21/2014   Chronic neck pain 09/15/2013   Home Medication(s) Prior to Admission medications   Medication Sig Start Date End Date Taking? Authorizing Provider  doxycycline (VIBRAMYCIN) 100 MG capsule Take 1 capsule (100 mg total) by mouth 2 (two) times daily. 04/26/22  Yes Ivette Castronova, Amadeo Garnet, MD  fluconazole (DIFLUCAN) 150 MG tablet Take 1 tablet (150 mg total) by mouth daily for 1 day. 04/26/22 04/27/22 Yes Addysyn Fern, Amadeo Garnet, MD  acetaminophen (TYLENOL) 500 MG tablet Take 1 tablet (500 mg total) by mouth every 6 (six) hours as needed. Patient taking differently: Take 1,000 mg by mouth every 6  (six) hours as needed for moderate pain. 10/31/17   Law, Waylan Boga, PA-C  ALPRAZolam Prudy Feeler) 0.5 MG tablet Take 0.5 mg by mouth 3 (three) times daily as needed for anxiety.    [provider]  butalbital-acetaminophen-caffeine (FIORICET) 50-325-40 MG tablet Take 1 tablet by mouth every 6 (six) hours as needed for migraine. 10/24/16   [provider]  diclofenac (VOLTAREN) 75 MG EC tablet Take 75 mg by mouth 2 (two) times daily.    [provider]  diclofenac Sodium (VOLTAREN) 1 % GEL Apply 1 application topically 4 (four) times daily as needed (pain).    [provider]  doxepin (SINEQUAN) 25 MG capsule Take 1 capsule (25 mg total) by mouth at bedtime. Patient taking differently: Take 25 mg by mouth at bedtime as needed (anxiety). 01/08/17   McVey, Madelaine Bhat, PA-C  ECHINACEA PO Take 1 capsule by mouth daily.    [provider]  FLUoxetine (PROZAC) 40 MG capsule Take 40 mg by mouth daily.    [provider]  folic acid (FOLVITE) 1 MG tablet Take 1 mg by mouth daily.    [provider]  gabapentin (NEURONTIN) 400 MG capsule Take 400 mg by mouth 3 (three) times daily. 11/23/19   [provider]  lamoTRIgine (LAMICTAL) 150 MG tablet Take 150 mg by mouth 2 (two) times daily. 11/16/20   [provider]  naproxen sodium (ALEVE) 220 MG tablet Take 440 mg by mouth 2 (two) times daily as needed (pain).    [provider]  oxyCODONE-acetaminophen (PERCOCET) 5-325 MG tablet Take 1-2 tablets by mouth every 4 (four) hours as needed. 02/22/21   Ocie Doyne, DMD  prazosin (MINIPRESS) 2 MG capsule Take 2 mg by mouth at bedtime. 11/16/20   [provider]  SUMAtriptan (IMITREX) 50 MG tablet Take 1 tablet (50 mg total) by mouth once for 1 dose. May repeat in 2 hours if headache persists or recurs. Patient taking differently: Take 50 mg by mouth every 2 (two) hours as needed for migraine. May repeat in 2 hours if  headache persists or recurs. 09/25/17 09/25/17  McVey, Madelaine Bhat, PA-C  tiZANidine (ZANAFLEX) 4 MG tablet Take 4 mg by mouth 3 (three) times daily. 11/07/19   [provider]                                                                                                                                    Allergies Patient has no known allergies.  Review of Systems Review of Systems  Constitutional:  Negative for fever.  Gastrointestinal:  Negative for nausea.  Skin:  Positive for rash.  Neurological:  Negative for headaches.   As noted in HPI  Physical Exam Vital Signs  I have reviewed the triage vital signs BP 131/72   Pulse (!) 56   Temp 98.3 F (36.8 C) (Oral)   Resp 18   Ht 5\' 8"  (1.727 m)   Wt 67.6 kg   LMP 01/16/2017   SpO2 99%   BMI 22.66 kg/m   Physical Exam Vitals reviewed.  Constitutional:      General: She is not in acute distress.    Appearance: She is well-developed. She is not diaphoretic.  HENT:     Head: Normocephalic and atraumatic.      Right Ear: External ear normal.     Left Ear: External ear normal.     Nose: Nose normal.  Eyes:     General: No scleral icterus.    Conjunctiva/sclera: Conjunctivae normal.  Neck:     Trachea: Phonation normal.  Cardiovascular:     Rate and Rhythm: Normal rate and regular rhythm.  Pulmonary:     Effort: Pulmonary effort is normal. No respiratory distress.     Breath sounds: No stridor.  Abdominal:     General: There is no distension.  Musculoskeletal:        General: Normal range of motion.     Cervical back: Normal range of motion.  Neurological:     Mental Status: She is alert and oriented to person, place, and time.  Psychiatric:        Behavior: Behavior normal.     ED Results and Treatments Labs (all labs ordered are listed, but only abnormal results are displayed) Labs Reviewed - No  data to display                                                                                                                        EKG  EKG Interpretation  Date/Time:    Ventricular Rate:    PR Interval:    QRS Duration:   QT Interval:    QTC Calculation:   R Axis:     Text Interpretation:         Radiology No results found.  Pertinent labs & imaging results that were available during my care of the patient were reviewed by me and considered in my medical decision making (see MDM for details).  Medications Ordered in ED Medications - No data to display                                                                                                                                   Procedures Procedures  (including critical care time)  Medical Decision Making / ED Course    Complexity of Problem:  Patient's presenting problem/concern, DDX, and MDM listed below: Rash Consistent with folliculitis to right face and small abscess to chin With reported h/o MRSA will treat with Abx No need for I&D She request ppx diflucan   Final Clinical Impression(s) / ED Diagnoses Final diagnoses:  Folliculitis   The patient appears reasonably screened and/or stabilized for discharge and I doubt any other medical condition or other [facility] requiring further screening, evaluation, or treatment in the ED at this time prior to discharge. Safe for discharge with strict return precautions.  Disposition: Discharge  Condition: Good  I have discussed the results, Dx and Tx plan with the patient/family who expressed understanding and agree(s) with the plan. Discharge instructions discussed at length. The patient/family was given strict return precautions who verbalized understanding of the instructions. No further questions at time of discharge.    ED Discharge Orders          Ordered    doxycycline (VIBRAMYCIN) 100 MG capsule  2 times daily        04/26/22 0216    fluconazole (DIFLUCAN) 150 MG tablet  Daily        04/26/22 0216             Follow Up: Fleet Contras,  MD [address] [place] [address] [phone]  Call  to schedule an appointment for close follow  up           This chart was dictated using voice recognition software.  Despite best efforts to proofread,  errors can occur which can change the documentation meaning.    Nira Conn, MD 04/26/22 [phone]

## 2022-04-26 NOTE — ED Notes (Signed)
Swollen, red area noted to chin.  Pt states painful and thinks she was bitten by a spider

## 2022-04-27 ENCOUNTER — Emergency Department (HOSPITAL_COMMUNITY): Payer: Medicaid Other

## 2022-04-27 ENCOUNTER — Other Ambulatory Visit: Payer: Self-pay

## 2022-04-27 ENCOUNTER — Emergency Department (HOSPITAL_COMMUNITY)
Admission: EM | Admit: 2022-04-27 | Discharge: 2022-04-27 | Disposition: A | Discharge status: Home / Self Care | Payer: Medicaid Other | Attending: Emergency Medicine | Admitting: Emergency Medicine

## 2022-04-27 DIAGNOSIS — L0201 Cutaneous abscess of face: Secondary | ICD-10-CM | POA: Diagnosis not present

## 2022-04-27 DIAGNOSIS — R220 Localized swelling, mass and lump, head: Secondary | ICD-10-CM | POA: Diagnosis present

## 2022-04-27 LAB — BASIC METABOLIC PANEL
Anion gap: 9 (ref 5–15)
BUN: 7 mg/dL (ref 6–20)
CO2: 25 mmol/L (ref 22–32)
Calcium: 8.8 mg/dL — ABNORMAL LOW (ref 8.9–10.3)
Chloride: 105 mmol/L (ref 98–111)
Creatinine, Ser: 0.87 mg/dL (ref 0.44–1.00)
GFR, Estimated: >60 mL/min (ref >60)
Glucose, Bld: 92 mg/dL (ref 70–99)
Potassium: 3.2 mmol/L — ABNORMAL LOW (ref 3.5–5.1)
Sodium: 139 mmol/L (ref 135–145)

## 2022-04-27 LAB — CBC WITH DIFFERENTIAL/PLATELET
Abs Immature Granulocytes: 0.03 10*3/uL (ref 0.00–0.07)
Basophils Absolute: 0 10*3/uL (ref 0.0–0.1)
Basophils Relative: 0 %
Eosinophils Absolute: 0.2 10*3/uL (ref 0.0–0.5)
Eosinophils Relative: 2 %
HCT: 32.7 % — ABNORMAL LOW (ref 36.0–46.0)
Hemoglobin: 10.6 g/dL — ABNORMAL LOW (ref 12.0–15.0)
Immature Granulocytes: 0 %
Lymphocytes Relative: 22 %
Lymphs Abs: 1.7 10*3/uL (ref 0.7–4.0)
MCH: 30.8 pg (ref 26.0–34.0)
MCHC: 32.4 g/dL (ref 30.0–36.0)
MCV: 95.1 fL (ref 80.0–100.0)
Monocytes Absolute: 0.5 10*3/uL (ref 0.1–1.0)
Monocytes Relative: 7 %
Neutro Abs: 5.4 10*3/uL (ref 1.7–7.7)
Neutrophils Relative %: 69 %
Platelets: 279 10*3/uL (ref 150–400)
RBC: 3.44 MIL/uL — ABNORMAL LOW (ref 3.87–5.11)
RDW: 11.7 % (ref 11.5–15.5)
WBC: 7.9 10*3/uL (ref 4.0–10.5)
nRBC: 0 % (ref 0.0–0.2)

## 2022-04-27 MED ORDER — FENTANYL CITRATE PF 50 MCG/ML IJ SOSY
50.0000 ug | PREFILLED_SYRINGE | INTRAMUSCULAR | Status: DC | PRN
  Administered 2022-04-27: 50 ug via INTRAVENOUS
  Filled 2022-04-27: qty 1

## 2022-04-27 MED ORDER — OXYCODONE-ACETAMINOPHEN 5-325 MG PO TABS
1.0000 | ORAL_TABLET | Freq: Once | ORAL | Status: AC
  Administered 2022-04-27: 1 via ORAL
  Filled 2022-04-27: qty 1

## 2022-04-27 MED ORDER — IOHEXOL 300 MG/ML  SOLN
100.0000 mL | Freq: Once | INTRAMUSCULAR | Status: AC | PRN
  Administered 2022-04-27: 100 mL via INTRAVENOUS

## 2022-04-27 MED ORDER — POTASSIUM CHLORIDE CRYS ER 20 MEQ PO TBCR
40.0000 meq | EXTENDED_RELEASE_TABLET | Freq: Once | ORAL | Status: AC
  Administered 2022-04-27: 40 meq via ORAL
  Filled 2022-04-27: qty 2

## 2022-04-27 MED ORDER — BUPIVACAINE HCL (PF) 0.5 % IJ SOLN
10.0000 mL | Freq: Once | INTRAMUSCULAR | Status: AC
  Administered 2022-04-27: 10 mL
  Filled 2022-04-27: qty 10

## 2022-04-27 NOTE — Discharge Instructions (Addendum)
Please continue doing warm compresses 4 times daily, take doxycycline for the entire course, Tylenol and ibuprofen as discussed below  Please use Tylenol or ibuprofen for pain.  You may use 600 mg ibuprofen every 6 hours or 1000 mg of Tylenol every 6 hours.  You may choose to alternate between the 2.  This would be most effective.  Not to exceed 4 g of Tylenol within 24 hours.  Not to exceed 3200 mg ibuprofen 24 hours.    You will need to follow up in your primary care office or return here in 48-72 hours for re-assessment of wound.   Take daily photos of this area to track the improvement.

## 2022-04-27 NOTE — ED Notes (Signed)
Patient transported to CT 

## 2022-04-27 NOTE — ED Triage Notes (Signed)
Pt woke up yesterday morning due insect bite on her chin, pt reports it has gotten worse since - it has redness, swelling, hot to touch. Pt attempted to drain it w/ diabetic husband's lancet and reports some purulent drainage did come out. Denies fevers

## 2022-04-27 NOTE — ED Notes (Signed)
Discharge instructions reviewed with patient. Patient denies any questions or concerns. Pt ambulatory to lobby. 

## 2022-04-27 NOTE — ED Provider Triage Note (Signed)
  Emergency Medicine Provider Triage Evaluation Note  MRN:  [id number]  Arrival date & time: 04/27/22    Medically screening exam initiated at 2:02 AM.   CC:   Facial Abscess  HPI:  Carrie [name] is a 51 y.o. year-old female presents to the ED with chief complaint of worsening facial abscess. States she was seen last night and was prescribed Doxycycline.  Reports worsening symptoms.  History provided by patient. ROS:  -As included in HPI PE:   Vitals:   04/27/22 0045  BP: (!) 93/58  Pulse: 60  Resp: 16  Temp: 98.3 F (36.8 C)  SpO2: 98%    Non-toxic appearing No respiratory distress Significant swelling of the chin MDM:  Based on signs and symptoms, abscess vs cellulitis is highest on my differential. I've ordered labs and imaging in triage to expedite lab/diagnostic workup.  Patient was informed that the remainder of the evaluation will be completed by another provider, this initial triage assessment does not replace that evaluation, and the importance of remaining in the ED until their evaluation is complete.    Roxy Horseman, PA-C 04/27/22 [phone]

## 2022-04-27 NOTE — ED Provider Notes (Signed)
[facility] EMERGENCY DEPARTMENT Provider Note   CSN: [id number] Arrival date & time: 04/27/22  5784     History  Chief Complaint  Patient presents with   Facial Pain    ZORA GLENDENNING is a 51 y.o. female.  HPI Patient is a 51 year old female with no pertinent past medical history presented emergency room today with complaints of right shin redness and swelling.  She states that she has a history of MRSA infections.  She was seen yesterday and initiated on doxycycline and has only taken this for approximately 24 hours.   Denies any fevers nausea vomiting.  Lightheadedness dizziness or any other associate symptoms.  She states that yesterday after she was seen in the ER she used one of her husband's CBG lancets which resulted in purulent drainage.      Home Medications Prior to Admission medications   Medication Sig Start Date End Date Taking? Authorizing Provider  acetaminophen (TYLENOL) 500 MG tablet Take 1 tablet (500 mg total) by mouth every 6 (six) hours as needed. Patient taking differently: Take 1,000 mg by mouth every 6 (six) hours as needed for moderate pain. 10/31/17   Law, Waylan Boga, PA-C  ALPRAZolam Prudy Feeler) 0.5 MG tablet Take 0.5 mg by mouth 3 (three) times daily as needed for anxiety.    [provider]  butalbital-acetaminophen-caffeine (FIORICET) 50-325-40 MG tablet Take 1 tablet by mouth every 6 (six) hours as needed for migraine. 10/24/16   [provider]  diclofenac (VOLTAREN) 75 MG EC tablet Take 75 mg by mouth 2 (two) times daily.    [provider]  diclofenac Sodium (VOLTAREN) 1 % GEL Apply 1 application topically 4 (four) times daily as needed (pain).    [provider]  doxepin (SINEQUAN) 25 MG capsule Take 1 capsule (25 mg total) by mouth at bedtime. Patient taking differently: Take 25 mg by mouth at bedtime as needed (anxiety). 01/08/17   McVey, Madelaine Bhat, PA-C  doxycycline (VIBRAMYCIN) 100  MG capsule Take 1 capsule (100 mg total) by mouth 2 (two) times daily. 04/26/22   Cardama, Amadeo Garnet, MD  ECHINACEA PO Take 1 capsule by mouth daily.    [provider]  fluconazole (DIFLUCAN) 150 MG tablet Take 1 tablet (150 mg total) by mouth daily for 1 day. 04/26/22 04/27/22  Nira Conn, MD  FLUoxetine (PROZAC) 40 MG capsule Take 40 mg by mouth daily.    [provider]  folic acid (FOLVITE) 1 MG tablet Take 1 mg by mouth daily.    [provider]  gabapentin (NEURONTIN) 400 MG capsule Take 400 mg by mouth 3 (three) times daily. 11/23/19   [provider]  lamoTRIgine (LAMICTAL) 150 MG tablet Take 150 mg by mouth 2 (two) times daily. 11/16/20   [provider]  naproxen sodium (ALEVE) 220 MG tablet Take 440 mg by mouth 2 (two) times daily as needed (pain).    [provider]  oxyCODONE-acetaminophen (PERCOCET) 5-325 MG tablet Take 1-2 tablets by mouth every 4 (four) hours as needed. 02/22/21   Ocie Doyne, DMD  prazosin (MINIPRESS) 2 MG capsule Take 2 mg by mouth at bedtime. 11/16/20   [provider]  SUMAtriptan (IMITREX) 50 MG tablet Take 1 tablet (50 mg total) by mouth once for 1 dose. May repeat in 2 hours if headache persists or recurs. Patient taking differently: Take 50 mg by mouth every 2 (two) hours as needed for migraine. May repeat in 2 hours if  headache persists or recurs. 09/25/17 09/25/17  McVey, Madelaine Bhat, PA-C  tiZANidine (ZANAFLEX) 4 MG tablet Take 4 mg by mouth 3 (three) times daily. 11/07/19   [provider]      Allergies    Patient has no known allergies.    Review of Systems   Review of Systems  Physical Exam Updated Vital Signs BP 106/80 (BP Location: Left Arm)   Pulse 60   Temp 98 F (36.7 C) (Oral)   Resp 17   LMP 01/16/2017   SpO2 98%  Physical Exam Vitals and nursing note reviewed.  Constitutional:      General: She is not in acute distress.    Appearance: Normal  appearance. She is not ill-appearing.  HENT:     Head: Normocephalic and atraumatic.  Eyes:     General: No scleral icterus.       Right eye: No discharge.        Left eye: No discharge.     Conjunctiva/sclera: Conjunctivae normal.  Pulmonary:     Effort: Pulmonary effort is normal.     Breath sounds: No stridor.  Skin:    Comments: Right shin with redness and swelling approximately 2 x 2 cm there is also a 1 x 1 cm area of fluctuance with a head.  Neurological:     Mental Status: She is alert and oriented to person, place, and time. Mental status is at baseline.     ED Results / Procedures / Treatments   Labs (all labs ordered are listed, but only abnormal results are displayed) Labs Reviewed  CBC WITH DIFFERENTIAL/PLATELET - Abnormal; Notable for the following components:      Result Value   RBC 3.44 (*)    Hemoglobin 10.6 (*)    HCT 32.7 (*)    All other components within normal limits  BASIC METABOLIC PANEL - Abnormal; Notable for the following components:   Potassium 3.2 (*)    Calcium 8.8 (*)    All other components within normal limits    EKG None  Radiology CT Maxillofacial W Contrast  Result Date: 04/27/2022 CLINICAL DATA:  Maxillary/facial abscess. Bitten on chin by a spider. EXAM: CT MAXILLOFACIAL WITH CONTRAST TECHNIQUE: Multidetector CT imaging of the maxillofacial structures was performed with intravenous contrast. Multiplanar CT image reconstructions were also generated. RADIATION DOSE REDUCTION: This exam was performed according to the departmental dose-optimization program which includes automated exposure control, adjustment of the mA and/or kV according to patient size and/or use of iterative reconstruction technique. CONTRAST:  OMNIPAQUE IOHEXOL 300 MG/ML  SOLN COMPARISON:  CT head 11/28/2014 FINDINGS: Osseous: No acute fracture, mandibular dislocation, or suspicious osseous lesion. Absent dentition. Asymmetric facet arthrosis on the left at C2-3 and  on the right at C3-4 and C4-5 with grade 1 anterolisthesis at each level. Advanced disc degeneration at C5-6 and C6-7. Orbits: Unremarkable. Sinuses: Paranasal sinuses and mastoid air cells are clear. Rightward nasal septal deviation. Soft tissues: Moderate soft tissue swelling involving the chin within and predominantly to the right of midline. No organized fluid collection or soft tissue gas. Limited intracranial: Unremarkable. IMPRESSION: Soft tissue swelling involving the right aspect of the chin which may reflect cellulitis. No abscess identified. Electronically Signed   By: Sebastian Ache M.D.   On: 04/27/2022 13:28    Procedures .Marland KitchenIncision and Drainage  Date/Time: 04/27/2022 2:30 PM  Performed by: Gailen Shelter, PA Authorized by: Gailen Shelter, PA   Consent:    Consent obtained:  Verbal   Consent given by:  Patient   Risks discussed:  Bleeding, incomplete drainage, pain and damage to other organs   Alternatives discussed:  No treatment Universal protocol:    Procedure explained and questions answered to patient or proxy's satisfaction: yes     Relevant documents present and verified: yes     Test results available : yes     Imaging studies available: yes     Required blood products, implants, devices, and special equipment available: yes     Site/side marked: yes     Immediately prior to procedure, a time out was called: yes     Patient identity confirmed:  Verbally with patient Location:    Type:  Abscess   Size:  1x1 cm   Location:  Head   Head/neck location: chin. Pre-procedure details:    Skin preparation:  Betadine Anesthesia:    Anesthesia method:  Local infiltration   Local anesthetic:  Bupivacaine 0.5% w/o epi Procedure type:    Complexity:  Simple Procedure details:    Incision types:  Single straight   Incision depth:  Subcutaneous   Wound management:  Probed and deloculated, irrigated with saline and extensive cleaning   Drainage:  Purulent   Drainage  amount:  Moderate Post-procedure details:    Procedure completion:  Tolerated well, no immediate complications     Medications Ordered in ED Medications  fentaNYL (SUBLIMAZE) injection 50 mcg (50 mcg Intravenous Given 04/27/22 1257)  oxyCODONE-acetaminophen (PERCOCET/ROXICET) 5-325 MG per tablet 1 tablet (1 tablet Oral Given 04/27/22 0211)  potassium chloride SA (KLOR-CON M) CR tablet 40 mEq (40 mEq Oral Given 04/27/22 1258)  iohexol (OMNIPAQUE) 300 MG/ML solution 100 mL (100 mLs Intravenous Contrast Given 04/27/22 1310)  bupivacaine(PF) (MARCAINE) 0.5 % injection 10 mL (10 mLs Infiltration Given by Other 04/27/22 1407)    ED Course/ Medical Decision Making/ A&P Clinical Course as of 04/27/22 1434  Thu Apr 27, 2022  1433 IMPRESSION: Soft tissue swelling involving the right aspect of the chin which may reflect cellulitis. No abscess identified.   Electronically Signed   By: Logan Bores M.D.   On: 04/27/2022 13:28   [WF]    Clinical Course User Index [WF] Tedd Sias, PA                           Medical Decision Making Risk Prescription drug management.    Right shin with redness and swelling.  Clinically patient has an abscess.  CT was obtained in triage which I viewed the images of.  Agree with radiology read soft tissue swelling is present.  Will incise and drain abscess.  Purulence was noted with draining and patient tolerated procedure well.  Will discharge home with close follow-up with PCP or return to the ER  and continue doxycycline she was prescribed yesterday  CBC unremarkable. BMP w hypo K repleted here. FU w pcp for recheck   Final Clinical Impression(s) / ED Diagnoses Final diagnoses:  Abscess of chin    Rx / DC Orders ED Discharge Orders     None         Tedd Sias, [place] 04/27/22 1435    Wyvonnia Dusky, MD 04/27/22 1651

## 2022-07-20 NOTE — H&P (Signed)
PREOPERATIVE H&P  Chief Complaint: RIGHT SHOULDER ROTATOR CUFF REPAIR  HPI: Carrie [name] is a 51 y.o. female who presents with a diagnosis of RIGHT SHOULDER ROTATOR CUFF TEAR. Symptoms are rated as moderate to severe, and have been worsening.  This is significantly impairing activities of daily living.  She has elected for surgical management.   Past Medical History:  Diagnosis Date   Anxiety    Arthritis    Bipolar disorder (HCC)    Chronic back pain    Complication of anesthesia 2008   woke up during D/C x 2 same year - during D/C   Depression    Head injury 2021   concussion - fell at home   Hypercholesteremia    Migraine    Neuromuscular disorder (HCC)    " fracure to L3"   Personality disorder (HCC)    Pneumonia    PTSD (post-traumatic stress disorder)    Past Surgical History:  Procedure Laterality Date   ABDOMINAL HYSTERECTOMY  2018   BREAST BIOPSY Right 10/31/2016   BREAST EXCISIONAL BIOPSY Right 2007   CESAREAN SECTION     DILATION AND CURETTAGE OF UTERUS     TOOTH EXTRACTION N/A 02/22/2021   Procedure: DENTAL RESTORATION/EXTRACTION OF TEETH NUMBER SIX, SEVEN, EIGHT, NINE, TEN, ELEVEN, TWENTY-ONE, TWENTY-TWO, TWENTY-THREE, TWENTY-FOUR, TWENTY-FIVE, TWENTY-SIX, TWENTY-SEVEN, TWENTY-EIGHT WITH ALVEOLOPLASTY AND REMOVAL OF MANDIBULAR LINGUAL TORI;  Surgeon: Ocie Doyne, DMD;  Location: [facility] OR;  Service: Oral Surgery;  Laterality: N/A;   Social History   Socioeconomic History   Marital status: Married    Spouse name: Not on file   Number of children: Not on file   Years of education: Not on file   Highest education level: Not on file  Occupational History   Not on file  Tobacco Use   Smoking status: Former    Types: Cigarettes    Quit date: 2015    Years since quitting: 8.7   Smokeless tobacco: Current  Vaping Use   Vaping Use: Every day   Substances: Nicotine, Flavoring  Substance and Sexual Activity   Alcohol use: No    Comment: OCCASIONAL   Drug  use: Yes    Frequency: 14.0 times per week    Types: Marijuana   Sexual activity: Yes    Partners: Male    Birth control/protection: Surgical  Other Topics Concern   Not on file  Social History Narrative   Not on file   Social Determinants of Health   Financial Resource Strain: Not on file  Food Insecurity: Not on file  Transportation Needs: Not on file  Physical Activity: Not on file  Stress: Not on file  Social Connections: Not on file   Family History  Problem Relation Age of Onset   Cancer Mother        breast cancer    Multiple sclerosis Mother    Thyroid disease Mother    Cancer Maternal Grandmother    Breast cancer Maternal Grandmother    No Known Allergies Prior to Admission medications   Medication Sig Start Date End Date Taking? Authorizing Provider  acetaminophen (TYLENOL) 500 MG tablet Take 1 tablet (500 mg total) by mouth every 6 (six) hours as needed. Patient taking differently: Take 1,000 mg by mouth every 6 (six) hours as needed for moderate pain. 10/31/17   Law, Waylan Boga, PA-C  ALPRAZolam Prudy Feeler) 0.5 MG tablet Take 0.5 mg by mouth 3 (three) times daily as needed for anxiety.    [provider]  butalbital-acetaminophen-caffeine (FIORICET) 50-325-40 MG tablet Take 1 tablet by mouth every 6 (six) hours as needed for migraine. 10/24/16   [provider]  diclofenac (VOLTAREN) 75 MG EC tablet Take 75 mg by mouth 2 (two) times daily.    [provider]  diclofenac Sodium (VOLTAREN) 1 % GEL Apply 1 application topically 4 (four) times daily as needed (pain).    [provider]  doxepin (SINEQUAN) 25 MG capsule Take 1 capsule (25 mg total) by mouth at bedtime. Patient taking differently: Take 25 mg by mouth at bedtime as needed (anxiety). 01/08/17   McVey, Gelene Mink, PA-C  doxycycline (VIBRAMYCIN) 100 MG capsule Take 1 capsule (100 mg total) by mouth 2 (two) times daily. 04/26/22   Cardama, Grayce Sessions, MD  ECHINACEA PO  Take 1 capsule by mouth daily.    [provider]  FLUoxetine (PROZAC) 40 MG capsule Take 40 mg by mouth daily.    [provider]  folic acid (FOLVITE) 1 MG tablet Take 1 mg by mouth daily.    [provider]  gabapentin (NEURONTIN) 400 MG capsule Take 400 mg by mouth 3 (three) times daily. 11/23/19   [provider]  lamoTRIgine (LAMICTAL) 150 MG tablet Take 150 mg by mouth 2 (two) times daily. 11/16/20   [provider]  naproxen sodium (ALEVE) 220 MG tablet Take 440 mg by mouth 2 (two) times daily as needed (pain).    [provider]  oxyCODONE-acetaminophen (PERCOCET) 5-325 MG tablet Take 1-2 tablets by mouth every 4 (four) hours as needed. 02/22/21   Diona Browner, DMD  prazosin (MINIPRESS) 2 MG capsule Take 2 mg by mouth at bedtime. 11/16/20   [provider]  SUMAtriptan (IMITREX) 50 MG tablet Take 1 tablet (50 mg total) by mouth once for 1 dose. May repeat in 2 hours if headache persists or recurs. Patient taking differently: Take 50 mg by mouth every 2 (two) hours as needed for migraine. May repeat in 2 hours if headache persists or recurs. 09/25/17 09/25/17  McVey, Gelene Mink, PA-C  tiZANidine (ZANAFLEX) 4 MG tablet Take 4 mg by mouth 3 (three) times daily. 11/07/19   [provider]     Positive ROS: All other systems have been reviewed and were otherwise negative with the exception of those mentioned in the HPI and as above.  Physical Exam: General: Alert, no acute distress Cardiovascular: No pedal edema Respiratory: No cyanosis, no use of accessory musculature GI: No organomegaly, abdomen is soft and non-tender Skin: No lesions in the area of chief complaint Neurologic: Sensation intact distally Psychiatric: Patient is competent for consent with normal mood and affect Lymphatic: No axillary or cervical lymphadenopathy  MUSCULOSKELETAL: TTP right shoulder, limited ROM d/t pain in all directions, decreased  strength, + O'Brien's test, + Hawkins test, NVI   Imaging: MRI shows supraspinatus tear, rotator cuff interval tear, osteoarthritis, and bursitis   Assessment: RIGHT SHOULDER ROTATOR CUFF TEAR  Plan: Plan for Procedure(s): RIGHT SHOULDER ARTHROSCOPY WITH ROTATOR CUFF REPAIR x2 (SUBSCAPULARIS, SUPRASPINATUS) AND SUBACROMIAL DECOMPRESSION, DISTAL CLAVICLECTOMY, BICEPS TENODESIS, MANIPULATION WITH LYSIS OF ADHESIONS  The risks benefits and alternatives were discussed with the patient including but not limited to the risks of nonoperative treatment, versus surgical intervention including infection, bleeding, nerve injury,  blood clots, cardiopulmonary complications, morbidity, mortality, among others, and they were willing to proceed.   Weightbearing: NWB RUE Orthopedic devices: sling Showering: POD 3 Dressing: reinforce PRN Medicines: ASA, Oxy, Tylenol, Mobic, Zofran  Discharge: home Follow  up: 08/18/22 at 11:00am    [place] Board Office [phone] 07/20/2022 11:34 AM

## 2022-08-03 ENCOUNTER — Encounter (HOSPITAL_BASED_OUTPATIENT_CLINIC_OR_DEPARTMENT_OTHER): Payer: Self-pay | Admitting: Orthopedic Surgery

## 2022-08-04 ENCOUNTER — Encounter (HOSPITAL_BASED_OUTPATIENT_CLINIC_OR_DEPARTMENT_OTHER): Payer: Self-pay | Admitting: Orthopedic Surgery

## 2022-08-04 NOTE — Progress Notes (Signed)
Spoke w/ via phone for pre-op interview--- pt Lab needs dos----   no            Lab results------ no COVID test -----patient states asymptomatic no test needed Arrive at ------- 0900 on 08-08-2022 NPO after MN NO Solid Food.  Clear liquids from MN until--- 0800 Med rec completed Medications to take morning of surgery ----- gabapentin, lamictal, prozac, zanaflex, if needed may take fioricet/ imitrex  Diabetic medication ----- n/a Patient instructed no nail polish to be worn day of surgery Patient instructed to bring photo id and insurance card day of surgery Patient aware to have Driver (ride ) / caregiver  for 24 hours after surgery -- husband, Carrie [name] Patient Special Instructions ----- n/a Pre-Op special Istructions ----- n/a Patient verbalized understanding of instructions that were given at this phone interview. Patient denies shortness of breath, chest pain, fever, cough at this phone interview.

## 2022-08-08 ENCOUNTER — Other Ambulatory Visit: Payer: Self-pay

## 2022-08-08 ENCOUNTER — Ambulatory Visit (HOSPITAL_BASED_OUTPATIENT_CLINIC_OR_DEPARTMENT_OTHER)
Admission: RE | Admit: 2022-08-08 | Discharge: 2022-08-08 | Disposition: A | Discharge status: Home / Self Care | Payer: Medicaid Other | Attending: Orthopedic Surgery | Admitting: Orthopedic Surgery

## 2022-08-08 ENCOUNTER — Encounter (HOSPITAL_BASED_OUTPATIENT_CLINIC_OR_DEPARTMENT_OTHER)
Admission: RE | Disposition: A | Discharge status: Home / Self Care | Payer: Self-pay | Source: Home / Self Care | Attending: Orthopedic Surgery

## 2022-08-08 ENCOUNTER — Ambulatory Visit (HOSPITAL_BASED_OUTPATIENT_CLINIC_OR_DEPARTMENT_OTHER): Payer: Medicaid Other | Admitting: Anesthesiology

## 2022-08-08 ENCOUNTER — Encounter (HOSPITAL_BASED_OUTPATIENT_CLINIC_OR_DEPARTMENT_OTHER): Payer: Self-pay | Admitting: Orthopedic Surgery

## 2022-08-08 DIAGNOSIS — M7501 Adhesive capsulitis of right shoulder: Secondary | ICD-10-CM | POA: Diagnosis not present

## 2022-08-08 DIAGNOSIS — Z9889 Other specified postprocedural states: Secondary | ICD-10-CM

## 2022-08-08 DIAGNOSIS — M7521 Bicipital tendinitis, right shoulder: Secondary | ICD-10-CM | POA: Insufficient documentation

## 2022-08-08 DIAGNOSIS — M75111 Incomplete rotator cuff tear or rupture of right shoulder, not specified as traumatic: Secondary | ICD-10-CM | POA: Diagnosis present

## 2022-08-08 DIAGNOSIS — G629 Polyneuropathy, unspecified: Secondary | ICD-10-CM | POA: Diagnosis not present

## 2022-08-08 DIAGNOSIS — Z87891 Personal history of nicotine dependence: Secondary | ICD-10-CM | POA: Diagnosis not present

## 2022-08-08 DIAGNOSIS — F319 Bipolar disorder, unspecified: Secondary | ICD-10-CM | POA: Diagnosis not present

## 2022-08-08 DIAGNOSIS — M75101 Unspecified rotator cuff tear or rupture of right shoulder, not specified as traumatic: Secondary | ICD-10-CM | POA: Diagnosis not present

## 2022-08-08 DIAGNOSIS — F410 Panic disorder [episodic paroxysmal anxiety] without agoraphobia: Secondary | ICD-10-CM | POA: Diagnosis not present

## 2022-08-08 DIAGNOSIS — S43431A Superior glenoid labrum lesion of right shoulder, initial encounter: Secondary | ICD-10-CM | POA: Diagnosis not present

## 2022-08-08 DIAGNOSIS — M19011 Primary osteoarthritis, right shoulder: Secondary | ICD-10-CM | POA: Diagnosis not present

## 2022-08-08 DIAGNOSIS — G43909 Migraine, unspecified, not intractable, without status migrainosus: Secondary | ICD-10-CM | POA: Diagnosis not present

## 2022-08-08 DIAGNOSIS — M7541 Impingement syndrome of right shoulder: Secondary | ICD-10-CM | POA: Insufficient documentation

## 2022-08-08 DIAGNOSIS — G8929 Other chronic pain: Secondary | ICD-10-CM | POA: Diagnosis not present

## 2022-08-08 DIAGNOSIS — X58XXXA Exposure to other specified factors, initial encounter: Secondary | ICD-10-CM | POA: Insufficient documentation

## 2022-08-08 DIAGNOSIS — Z01818 Encounter for other preprocedural examination: Secondary | ICD-10-CM

## 2022-08-08 HISTORY — DX: Unspecified rotator cuff tear or rupture of right shoulder, not specified as traumatic: M75.101

## 2022-08-08 HISTORY — DX: Complete loss of teeth, unspecified cause, unspecified class: Z97.2

## 2022-08-08 HISTORY — PX: SHOULDER ARTHROSCOPY WITH ROTATOR CUFF REPAIR AND SUBACROMIAL DECOMPRESSION: SHX5686

## 2022-08-08 HISTORY — DX: Major depressive disorder, single episode, unspecified: F32.9

## 2022-08-08 HISTORY — DX: Neuralgia and neuritis, unspecified: M79.2

## 2022-08-08 HISTORY — DX: Other intervertebral disc degeneration, lumbosacral region without mention of lumbar back pain or lower extremity pain: M51.379

## 2022-08-08 HISTORY — DX: Unspecified osteoarthritis, unspecified site: M19.90

## 2022-08-08 HISTORY — DX: Other intervertebral disc degeneration, lumbosacral region: M51.37

## 2022-08-08 HISTORY — DX: Hyperlipidemia, unspecified: E78.5

## 2022-08-08 HISTORY — DX: Agoraphobia with panic disorder: F40.01

## 2022-08-08 HISTORY — DX: Unspecified mood (affective) disorder: F39

## 2022-08-08 HISTORY — DX: Migraine, unspecified, not intractable, without status migrainosus: G43.909

## 2022-08-08 HISTORY — DX: Generalized anxiety disorder: F41.1

## 2022-08-08 HISTORY — DX: Complete loss of teeth, unspecified cause, unspecified class: K08.109

## 2022-08-08 HISTORY — DX: Carpal tunnel syndrome, bilateral upper limbs: G56.03

## 2022-08-08 SURGERY — SHOULDER ARTHROSCOPY WITH ROTATOR CUFF REPAIR AND SUBACROMIAL DECOMPRESSION
Anesthesia: General | Site: Shoulder | Laterality: Right

## 2022-08-08 MED ORDER — MIDAZOLAM HCL 2 MG/2ML IJ SOLN
INTRAMUSCULAR | Status: AC
  Filled 2022-08-08: qty 2

## 2022-08-08 MED ORDER — ASPIRIN 81 MG PO TBEC: 81.0000 mg | DELAYED_RELEASE_TABLET | Freq: Two times a day (BID) | ORAL | 0 refills | Status: DC

## 2022-08-08 MED ORDER — OXYCODONE HCL 5 MG PO TABS
ORAL_TABLET | ORAL | Status: AC
  Filled 2022-08-08: qty 1

## 2022-08-08 MED ORDER — PHENYLEPHRINE HCL (PRESSORS) 10 MG/ML IV SOLN
INTRAVENOUS | Status: AC
  Filled 2022-08-08: qty 1

## 2022-08-08 MED ORDER — ROCURONIUM BROMIDE 10 MG/ML (PF) SYRINGE
PREFILLED_SYRINGE | INTRAVENOUS | Status: DC | PRN
  Administered 2022-08-08: 50 mg via INTRAVENOUS

## 2022-08-08 MED ORDER — BUPIVACAINE HCL 0.25 % IJ SOLN
INTRAMUSCULAR | Status: DC | PRN
  Administered 2022-08-08: 10 mL

## 2022-08-08 MED ORDER — SUGAMMADEX SODIUM 200 MG/2ML IV SOLN
INTRAVENOUS | Status: DC | PRN
  Administered 2022-08-08: 150 mg via INTRAVENOUS

## 2022-08-08 MED ORDER — BUPIVACAINE LIPOSOME 1.3 % IJ SUSP
INTRAMUSCULAR | Status: DC | PRN
  Administered 2022-08-08: 10 mL

## 2022-08-08 MED ORDER — ROCURONIUM BROMIDE 10 MG/ML (PF) SYRINGE
PREFILLED_SYRINGE | INTRAVENOUS | Status: AC
  Filled 2022-08-08: qty 10

## 2022-08-08 MED ORDER — PROMETHAZINE HCL 25 MG/ML IJ SOLN: 6.2500 mg | INTRAMUSCULAR | Status: DC | PRN

## 2022-08-08 MED ORDER — OXYCODONE HCL 5 MG/5ML PO SOLN: 5.0000 mg | Freq: Once | ORAL | Status: AC | PRN

## 2022-08-08 MED ORDER — PROPOFOL 10 MG/ML IV BOLUS
INTRAVENOUS | Status: DC | PRN
  Administered 2022-08-08: 160 mg via INTRAVENOUS
  Administered 2022-08-08: 20 mg via INTRAVENOUS

## 2022-08-08 MED ORDER — ONDANSETRON HCL 4 MG/2ML IJ SOLN
INTRAMUSCULAR | Status: DC | PRN
  Administered 2022-08-08: 4 mg via INTRAVENOUS

## 2022-08-08 MED ORDER — FENTANYL CITRATE (PF) 100 MCG/2ML IJ SOLN
INTRAMUSCULAR | Status: DC | PRN
  Administered 2022-08-08 (×2): 50 ug via INTRAVENOUS

## 2022-08-08 MED ORDER — SODIUM CHLORIDE 0.9 % IR SOLN
Status: DC | PRN
  Administered 2022-08-08 (×2): 6000 mL
  Administered 2022-08-08: 3000 mL

## 2022-08-08 MED ORDER — ACETAMINOPHEN 500 MG PO TABS
1000.0000 mg | ORAL_TABLET | Freq: Once | ORAL | Status: AC
  Administered 2022-08-08: 1000 mg via ORAL

## 2022-08-08 MED ORDER — PROPOFOL 10 MG/ML IV BOLUS
INTRAVENOUS | Status: AC
  Filled 2022-08-08: qty 20

## 2022-08-08 MED ORDER — MELOXICAM 15 MG PO TABS: 15.0000 mg | ORAL_TABLET | Freq: Every day | ORAL | 0 refills | Status: DC

## 2022-08-08 MED ORDER — HYDROMORPHONE HCL 1 MG/ML IJ SOLN
0.5000 mg | INTRAMUSCULAR | Status: DC | PRN
  Administered 2022-08-08: 0.5 mg via INTRAVENOUS

## 2022-08-08 MED ORDER — LIDOCAINE 2% (20 MG/ML) 5 ML SYRINGE
INTRAMUSCULAR | Status: DC | PRN
  Administered 2022-08-08: 60 mg via INTRAVENOUS

## 2022-08-08 MED ORDER — LACTATED RINGERS IV SOLN: INTRAVENOUS | Status: DC

## 2022-08-08 MED ORDER — PHENYLEPHRINE HCL-NACL 20-0.9 MG/250ML-% IV SOLN
INTRAVENOUS | Status: DC | PRN
  Administered 2022-08-08: 10 ug/min via INTRAVENOUS

## 2022-08-08 MED ORDER — FENTANYL CITRATE (PF) 100 MCG/2ML IJ SOLN
INTRAMUSCULAR | Status: AC
  Filled 2022-08-08: qty 2

## 2022-08-08 MED ORDER — PHENYLEPHRINE 80 MCG/ML (10ML) SYRINGE FOR IV PUSH (FOR BLOOD PRESSURE SUPPORT)
PREFILLED_SYRINGE | INTRAVENOUS | Status: DC | PRN
  Administered 2022-08-08 (×4): 80 ug via INTRAVENOUS

## 2022-08-08 MED ORDER — GLYCOPYRROLATE PF 0.2 MG/ML IJ SOSY
PREFILLED_SYRINGE | INTRAMUSCULAR | Status: DC | PRN
  Administered 2022-08-08: .2 mg via INTRAVENOUS

## 2022-08-08 MED ORDER — ONDANSETRON 4 MG PO TBDP: 4.0000 mg | ORAL_TABLET | Freq: Two times a day (BID) | ORAL | 0 refills | Status: DC | PRN

## 2022-08-08 MED ORDER — CELECOXIB 200 MG PO CAPS
200.0000 mg | ORAL_CAPSULE | Freq: Once | ORAL | Status: AC
  Administered 2022-08-08: 200 mg via ORAL

## 2022-08-08 MED ORDER — FENTANYL CITRATE (PF) 100 MCG/2ML IJ SOLN
50.0000 ug | Freq: Once | INTRAMUSCULAR | Status: AC
  Administered 2022-08-08: 50 ug via INTRAVENOUS

## 2022-08-08 MED ORDER — ACETAMINOPHEN 500 MG PO TABS: 1000.0000 mg | ORAL_TABLET | Freq: Once | ORAL | Status: DC

## 2022-08-08 MED ORDER — MIDAZOLAM HCL 5 MG/5ML IJ SOLN
INTRAMUSCULAR | Status: DC | PRN
  Administered 2022-08-08 (×2): 1 mg via INTRAVENOUS

## 2022-08-08 MED ORDER — CEFAZOLIN SODIUM-DEXTROSE 2-4 GM/100ML-% IV SOLN
2.0000 g | INTRAVENOUS | Status: AC
  Administered 2022-08-08: 2 g via INTRAVENOUS

## 2022-08-08 MED ORDER — HYDROMORPHONE HCL 1 MG/ML IJ SOLN
INTRAMUSCULAR | Status: AC
  Filled 2022-08-08: qty 1

## 2022-08-08 MED ORDER — MIDAZOLAM HCL 2 MG/2ML IJ SOLN
1.0000 mg | Freq: Once | INTRAMUSCULAR | Status: AC
  Administered 2022-08-08: 1 mg via INTRAVENOUS

## 2022-08-08 MED ORDER — HYDROMORPHONE HCL 1 MG/ML IJ SOLN
INTRAMUSCULAR | Status: DC | PRN
  Administered 2022-08-08 (×2): .5 mg via INTRAVENOUS

## 2022-08-08 MED ORDER — LIDOCAINE HCL (PF) 2 % IJ SOLN
INTRAMUSCULAR | Status: AC
  Filled 2022-08-08: qty 5

## 2022-08-08 MED ORDER — OXYCODONE HCL 5 MG PO TABS: 5.0000 mg | ORAL_TABLET | Freq: Four times a day (QID) | ORAL | 0 refills | Status: DC | PRN

## 2022-08-08 MED ORDER — OXYCODONE HCL 5 MG PO TABS
5.0000 mg | ORAL_TABLET | Freq: Once | ORAL | Status: AC | PRN
  Administered 2022-08-08: 5 mg via ORAL

## 2022-08-08 MED ORDER — EPHEDRINE SULFATE-NACL 50-0.9 MG/10ML-% IV SOSY
PREFILLED_SYRINGE | INTRAVENOUS | Status: DC | PRN
  Administered 2022-08-08: 10 mg via INTRAVENOUS
  Administered 2022-08-08: 15 mg via INTRAVENOUS
  Administered 2022-08-08: 25 mg via INTRAVENOUS

## 2022-08-08 MED ORDER — POVIDONE-IODINE 10 % EX SWAB: 2.0000 | Freq: Once | CUTANEOUS | Status: DC

## 2022-08-08 MED ORDER — BUPIVACAINE HCL (PF) 0.5 % IJ SOLN
INTRAMUSCULAR | Status: DC | PRN
  Administered 2022-08-08: 15 mL via PERINEURAL

## 2022-08-08 MED ORDER — DEXAMETHASONE SODIUM PHOSPHATE 10 MG/ML IJ SOLN
8.0000 mg | Freq: Once | INTRAMUSCULAR | Status: AC
  Administered 2022-08-08: 10 mg via INTRAVENOUS

## 2022-08-08 MED ORDER — FENTANYL CITRATE (PF) 100 MCG/2ML IJ SOLN
25.0000 ug | INTRAMUSCULAR | Status: DC | PRN
  Administered 2022-08-08 (×2): 50 ug via INTRAVENOUS

## 2022-08-08 SURGICAL SUPPLY — 49 items
AID PSTN UNV HD RSTRNT DISP (MISCELLANEOUS) ×1
ANCH SUT 2.9 PUSHLOCK ANCH (Orthopedic Implant) ×1 IMPLANT
ANCH SUT FBRTAPE 1.3X2.6X1.7 (Anchor) IMPLANT
ANCHOR SUT FBRTK 2.6 SOFT 1.7 (Anchor) IMPLANT
APL PRP STRL LF DISP 70% ISPRP (MISCELLANEOUS) ×1
BURR CLEARCUT OVAL 5.5X13 (MISCELLANEOUS) IMPLANT
BURR OVAL 12 FL 5.5X13 (MISCELLANEOUS)
BURR OVAL 8 FLU 4.0X13 (MISCELLANEOUS) IMPLANT
CANNULA TWIST IN 8.25X7CM (CANNULA) IMPLANT
CHLORAPREP W/TINT 26 (MISCELLANEOUS) ×2 IMPLANT
DISSECTOR 4.0MM X 13CM (MISCELLANEOUS) IMPLANT
DRAPE IMP U-DRAPE 54X76 (DRAPES) ×2 IMPLANT
DRAPE INCISE IOBAN 66X45 STRL (DRAPES) ×2 IMPLANT
DRAPE ORTHO SPLIT 77X108 STRL (DRAPES) ×1
DRAPE SHOULDER BEACH CHAIR (DRAPES) ×2 IMPLANT
DRAPE SURG ORHT 6 SPLT 77X108 (DRAPES) ×4 IMPLANT
DRAPE U-SHAPE 47X51 STRL (DRAPES) ×2 IMPLANT
DRSG EMULSION OIL 3X3 NADH (GAUZE/BANDAGES/DRESSINGS) IMPLANT
GAUZE 4X4 16PLY ~~LOC~~+RFID DBL (SPONGE) ×2 IMPLANT
GAUZE PAD ABD 8X10 STRL (GAUZE/BANDAGES/DRESSINGS) ×2 IMPLANT
GAUZE SPONGE 4X4 12PLY STRL (GAUZE/BANDAGES/DRESSINGS) ×4 IMPLANT
GLOVE BIO SURGEON STRL SZ 6 (GLOVE) IMPLANT
GLOVE BIO SURGEON STRL SZ 6.5 (GLOVE) ×2 IMPLANT
GLOVE BIO SURGEON STRL SZ7.5 (GLOVE) ×4 IMPLANT
GLOVE BIOGEL PI IND STRL 6 (GLOVE) IMPLANT
GLOVE BIOGEL PI IND STRL 6.5 (GLOVE) ×2 IMPLANT
GLOVE BIOGEL PI IND STRL 7.5 (GLOVE) ×2 IMPLANT
GLOVE BIOGEL PI IND STRL 8 (GLOVE) ×4 IMPLANT
GLOVE SURG SS PI 7.5 STRL IVOR (GLOVE) ×2 IMPLANT
GOWN STRL REUS W/TWL LRG LVL3 (GOWN DISPOSABLE) ×2 IMPLANT
GOWN STRL REUS W/TWL XL LVL3 (GOWN DISPOSABLE) ×2 IMPLANT
IV NS IRRIG 3000ML ARTHROMATIC (IV SOLUTION) ×8 IMPLANT
KIT ANCHOR FBRTK 2.6 STR (KITS) IMPLANT
KIT TURNOVER CYSTO (KITS) ×2 IMPLANT
MANIFOLD NEPTUNE II (INSTRUMENTS) ×2 IMPLANT
NDL HD SCORPION MEGA LOADER (NEEDLE) IMPLANT
PACK ARTHROSCOPY DSU (CUSTOM PROCEDURE TRAY) ×2 IMPLANT
PACK BASIN DAY SURGERY FS (CUSTOM PROCEDURE TRAY) ×2 IMPLANT
PASSER SUT SWIFTSTITCH HIP CRT (INSTRUMENTS) ×2 IMPLANT
RESTRAINT HEAD UNIVERSAL NS (MISCELLANEOUS) ×2 IMPLANT
SLEEVE SCD COMPRESS KNEE MED (STOCKING) ×2 IMPLANT
SLING ARM FOAM STRAP MED (SOFTGOODS) IMPLANT
SUT ETHILON 3 0 PS 1 (SUTURE) ×2 IMPLANT
SUTURE TAPE TIGERLINK 1.3MM BL (SUTURE) IMPLANT
SUTURETAPE TIGERLINK 1.3MM BL (SUTURE) ×1
SYSTEM IMPL TENODESIS LNT 2.9 (Orthopedic Implant) IMPLANT
TOWEL OR 17X26 10 PK STRL BLUE (TOWEL DISPOSABLE) ×2 IMPLANT
TUBING ARTHROSCOPY IRRIG 16FT (MISCELLANEOUS) ×2 IMPLANT
WAND ABLATOR APOLLO I90 (BUR) IMPLANT

## 2022-08-08 NOTE — Transfer of Care (Signed)
Immediate Anesthesia Transfer of Care Note  Patient: Carrie [name]  Procedure(s) Performed: Procedure(s) (LRB): SHOULDER ARTHROSCOPY WITH ROTATOR CUFF REPAIR (SUBSCAPULARIS) AND SUBACROMIAL DECOMPRESSION, DISTAL CLAVICLECTOMY, BICEPS TENODESIS, MANIPULATION WITH LYSIS OF ADHESIONS (Right)  Patient Location: PACU  Anesthesia Type: General  Level of Consciousness: awake, alert  and oriented  Airway & Oxygen Therapy: Patient Spontanous Breathing and Patient connected to nasal cannula oxygen  Post-op Assessment: Report given to PACU RN and Post -op Vital signs reviewed and stable  Post vital signs: Reviewed and stable  Complications: No apparent anesthesia complications  Last Vitals:  Vitals Value Taken Time  BP 115/62 08/08/22 1330  Temp    Pulse 89 08/08/22 1331  Resp 24 08/08/22 1331  SpO2 98 % 08/08/22 1331  Vitals shown include unvalidated device data.  Last Pain:  Vitals:   08/08/22 0850  TempSrc: Oral      Patients Stated Pain Goal: 4 (21/97/58 8325)  Complications: No notable events documented.

## 2022-08-08 NOTE — Op Note (Signed)
08/08/2022  2:16 PM  PATIENT:  Carrie [name]    PRE-OPERATIVE DIAGNOSIS:  RIGHT SHOULDER ROTATOR CUFF REPAIR  POST-OPERATIVE DIAGNOSIS:  Same  PROCEDURE:  SHOULDER ARTHROSCOPY WITH ROTATOR CUFF REPAIR (SUBSCAPULARIS) AND SUBACROMIAL DECOMPRESSION, DISTAL CLAVICLECTOMY, BICEPS TENODESIS, MANIPULATION WITH LYSIS OF ADHESIONS  SURGEON:  Sheral Apley, MD  ASSISTANT: Levester Fresh, PA-C, he was present and scrubbed throughout the case, critical for completion in a timely fashion, and for retraction, instrumentation, and closure.   ANESTHESIA:   General  PREOPERATIVE INDICATIONS:  Carrie [name] is a  51 y.o. female with a diagnosis of RIGHT SHOULDER ROTATOR CUFF REPAIR who failed conservative measures and elected for surgical management.    The risks benefits and alternatives were discussed with the patient preoperatively including but not limited to the risks of infection, bleeding, nerve injury, cardiopulmonary complications, the need for revision surgery, among others, and the patient was willing to proceed.  DIAGNOSES: Right shoulder, chronic rotator cuff tear, SLAP tear, biceps tendinitis, AC arthritis, subacromial impingement, and adhessive capsulitis .  POST-OPERATIVE DIAGNOSIS: same  PROCEDURE: Arthroscopic extensive debridement - 29823 Subdeltoid Bursa, Superior Labrum, and synovium Arthroscopic distal clavicle excision - 18299 Arthroscopic subacromial decompression - 29826 Arthroscopic rotator cuff repair - 29827 Arthroscopic biceps tenodesis - 29828 Manipulation with lysis of adhesions 29825   OPERATIVE FINDING: Exam under anesthesia: Normal Articular space: Normal Chondral surfaces: Normal Biceps:  tendonitis Subscapularis: Incomplete tear  Supraspinatus: Intact  Infraspinatus: Intact      BLOOD LOSS: minimal  COMPLICATIONS: none  OPERATIVE PROCEDURE:  Patient was identified in the preoperative holding area and site was marked by me He was  transported to the operating theater and placed on the table in beach chair position taking care to pad all bony prominences. After a preincinduction time out anesthesia was induced. The right upper extremity was prepped and draped in normal sterile fashion and a pre-incision timeout was performed. Carrie [name] received ancef for preoperative antibiotics.   I performed a firm but gentle manipulation under anesthesia and had palpable breakup of scar tissue and improvement in range of motion.  Initially made a posterior arthroscopic portal and inserted the arthroscope into the glenohumeral joint. tour of the joint demonstrated the above operative findings  I created an anterior portal just lateral to the coracoid under direct visualization using a spinal needle.  I performed an extensive debridement of the scarred synovial tissue, subdeltoid bursa, superior labrum and remaining structures   I used a combination of biter and shaver to release the biceps tendon from the superior labrum and then used the shaver to debride the superior labrum to a smooth rim. I performed a loop and tack tenodesis  I placed a stitch around the superior torn edge of the subscap and secured it into the 2.9 pushlock  I performed a lysis of adhesions at the interval  I then introduced the arthroscope into the subacromial space and brought the shaver into the anterior portal. I debrided the bursa for appropriate visualization.  I then performed a subacromial decompression using combination of the shaver ArthroCare and burr using a cutting block technique. As happy with the final elevation of the subacromial space on multiple portal views.   Next I turned my attention to the distal clavicle and through the anterior portal using the bur and shaver I was able to perform a distal clavicle excision. I then switched portals and inserted the arthroscope into the anterior portal and was happy with an appropriate resection  of the  distal clavicle.  I examined the supra tendon and it was intact  Next I removed all arthroscopic equipment expressed all fluid and closed the portals with a nylon stitch. A sterile dressing was applied the patient was taken the PACU in stable condition.  POST OPERATIVE PLAN: The patient will be in a sling full-time and keep the dressings clean dry and intact. DVT prophylaxis will consist of early ambulation

## 2022-08-08 NOTE — Anesthesia Procedure Notes (Signed)
Anesthesia Regional Block: Interscalene brachial plexus block   Pre-Anesthetic Checklist: , timeout performed,  Correct Patient, Correct Site, Correct Laterality,  Correct Procedure, Correct Position, site marked,  Risks and benefits discussed,  Surgical consent,  Pre-op evaluation,  At surgeon's request and post-op pain management  Laterality: Right  Prep: chloraprep       Needles:  Injection technique: Single-shot  Needle Type: Echogenic Needle     Needle Length: 5cm  Needle Gauge: 21     Additional Needles:   Narrative:  Start time: 08/08/2022 10:30 AM End time: 08/08/2022 10:34 AM Injection made incrementally with aspirations every 5 mL.  Performed by: Personally  Anesthesiologist: Audry Pili, MD  Additional Notes: No pain on injection. No increased resistance to injection. Injection made in 5cc increments. Good needle visualization. Patient tolerated the procedure well.

## 2022-08-08 NOTE — Anesthesia Preprocedure Evaluation (Addendum)
Anesthesia Evaluation  Patient identified by MRN, date of birth, ID bandGeneral Assessment Comment: Drowsy, arousable   Reviewed: Allergy & Precautions, NPO status , Patient's Chart, lab work & pertinent test results  History of Anesthesia Complications Negative for: history of anesthetic complications  Airway Mallampati: II  TM Distance: >3 FB Neck ROM: Full    Dental  (+) Edentulous Lower, Edentulous Upper   Pulmonary former smoker,    Pulmonary exam normal        Cardiovascular negative cardio ROS Normal cardiovascular exam     Neuro/Psych  Headaches, PSYCHIATRIC DISORDERS Anxiety Depression Bipolar Disorder  Agoraphobia, panic attacks  Neuromuscular disease (neuropathic pain)    GI/Hepatic negative GI ROS, (+)     substance abuse  marijuana use,   Endo/Other  negative endocrine ROS  Renal/GU negative Renal ROS     Musculoskeletal  (+) Arthritis , Osteoarthritis,    Abdominal   Peds  Hematology negative hematology ROS (+)   Anesthesia Other Findings Chronic pain   Reproductive/Obstetrics                            Anesthesia Physical Anesthesia Plan  ASA: 2  Anesthesia Plan: General   Post-op Pain Management: Regional block*, Tylenol PO (pre-op)* and Celebrex PO (pre-op)*   Induction: Intravenous  PONV Risk Score and Plan: 3 and Treatment may vary due to age or medical condition, Ondansetron, Midazolam and Diphenhydramine  Airway Management Planned: Oral ETT  Additional Equipment: None  Intra-op Plan:   Post-operative Plan: Extubation in OR  Informed Consent: I have reviewed the patients History and Physical, chart, labs and discussed the procedure including the risks, benefits and alternatives for the proposed anesthesia with the patient or authorized representative who has indicated his/her understanding and acceptance.     Dental advisory given  Plan Discussed  with: CRNA and Anesthesiologist  Anesthesia Plan Comments:        Anesthesia Quick Evaluation

## 2022-08-08 NOTE — Discharge Instructions (Addendum)
POST-OPERATIVE OPIOID TAPER INSTRUCTIONS: It is important to wean off of your opioid medication as soon as possible. If you do not need pain medication after your surgery it is ok to stop day one. Opioids include: Codeine, Hydrocodone(Norco, Vicodin), Oxycodone(Percocet, oxycontin) and hydromorphone amongst others.  Long term and even short term use of opiods can cause: Increased pain response Dependence Constipation Depression Respiratory depression And more.  Withdrawal symptoms can include Flu like symptoms Nausea, vomiting And more Techniques to manage these symptoms Hydrate well Eat regular healthy meals Stay active Use relaxation techniques(deep breathing, meditating, yoga) Do Not substitute Alcohol to help with tapering If you have been on opioids for less than two weeks and do not have pain than it is ok to stop all together.  Plan to wean off of opioids This plan should start within one week post op of your joint replacement. Maintain the same interval or time between taking each dose and first decrease the dose.  Cut the total daily intake of opioids by one tablet each day Next start to increase the time between doses. The last dose that should be eliminated is the evening dose.       No Tylenol until after 2:30

## 2022-08-08 NOTE — Progress Notes (Signed)
Assisted Dr. Brock with right, interscalene , ultrasound guided block. Side rails up, monitors on throughout procedure. See vital signs in flow sheet. Tolerated Procedure well. 

## 2022-08-08 NOTE — Anesthesia Procedure Notes (Signed)
Procedure Name: Intubation Date/Time: 08/08/2022 12:41 PM  Performed by: Mechele Claude, CRNAPre-anesthesia Checklist: Patient identified, Emergency Drugs available, Suction available and Patient being monitored Patient Re-evaluated:Patient Re-evaluated prior to induction Oxygen Delivery Method: Circle system utilized Preoxygenation: Pre-oxygenation with 100% oxygen Induction Type: IV induction Ventilation: Mask ventilation without difficulty Laryngoscope Size: Mac and 3 Grade View: Grade I Tube type: Oral Number of attempts: 1 Airway Equipment and Method: Stylet and Oral airway Placement Confirmation: ETT inserted through vocal cords under direct vision, positive ETCO2 and breath sounds checked- equal and bilateral Secured at: 22 cm Tube secured with: Tape Dental Injury: Teeth and Oropharynx as per pre-operative assessment

## 2022-08-08 NOTE — Interval H&P Note (Signed)
History and Physical Interval Note:  08/08/2022 9:46 AM  Carrie [name]  has presented today for surgery, with the diagnosis of [place].  The various methods of treatment have been discussed with the patient and family. After consideration of risks, benefits and other options for treatment, the patient has consented to  Procedure(s): SHOULDER ARTHROSCOPY WITH ROTATOR CUFF REPAIR x2 (SUBSCAPULARIS, SUPRASPINATUS) AND SUBACROMIAL DECOMPRESSION, DISTAL CLAVICLECTOMY, BICEPS TENODESIS, MANIPULATION WITH LYSIS OF ADHESIONS (Right) as a surgical intervention.  The patient's history has been reviewed, patient examined, no change in status, stable for surgery.  I have reviewed the patient's chart and labs.  Questions were answered to the patient's satisfaction.     Renette Butters

## 2022-08-09 ENCOUNTER — Encounter (HOSPITAL_BASED_OUTPATIENT_CLINIC_OR_DEPARTMENT_OTHER): Payer: Self-pay | Admitting: Orthopedic Surgery

## 2022-08-09 NOTE — Anesthesia Postprocedure Evaluation (Signed)
Anesthesia Post Note  Patient: Carrie [name]  Procedure(s) Performed: SHOULDER ARTHROSCOPY WITH ROTATOR CUFF REPAIR (SUBSCAPULARIS) AND SUBACROMIAL DECOMPRESSION, DISTAL CLAVICLECTOMY, BICEPS TENODESIS, MANIPULATION WITH LYSIS OF ADHESIONS (Right: Shoulder)     Patient location during evaluation: PACU Anesthesia Type: General Level of consciousness: awake and alert Pain management: pain level controlled Vital Signs Assessment: post-procedure vital signs reviewed and stable Respiratory status: spontaneous breathing, nonlabored ventilation and respiratory function stable Cardiovascular status: stable and blood pressure returned to baseline Anesthetic complications: no   No notable events documented.  Last Vitals:  Vitals:   08/08/22 1500 08/08/22 1530  BP: 123/75 103/74  Pulse: 74 84  Resp: 10 16  Temp: (!) 36.4 C (!) 36.4 C  SpO2: 95% 94%    Last Pain:  Vitals:   08/08/22 1543  TempSrc:   PainSc: [place]

## 2022-08-25 ENCOUNTER — Other Ambulatory Visit: Payer: Self-pay

## 2022-08-25 ENCOUNTER — Ambulatory Visit: Payer: Medicaid Other | Attending: Orthopedic Surgery | Admitting: Physical Therapy

## 2022-08-25 ENCOUNTER — Encounter: Payer: Self-pay | Admitting: Physical Therapy

## 2022-08-25 DIAGNOSIS — M25511 Pain in right shoulder: Secondary | ICD-10-CM | POA: Insufficient documentation

## 2022-08-25 DIAGNOSIS — G8929 Other chronic pain: Secondary | ICD-10-CM | POA: Insufficient documentation

## 2022-08-25 DIAGNOSIS — M6281 Muscle weakness (generalized): Secondary | ICD-10-CM | POA: Diagnosis present
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
## 2022-08-25 NOTE — Patient Instructions (Signed)
Access Code: [email or web address] URL: [email or web address] Date: 08/25/2022 Prepared by: Rosana Hoes  Exercises - Seated Shoulder Flexion Towel Slide at Table Top  - 3-5 x daily - 10 reps - 10 seconds hold - Circular Shoulder Pendulum with Table Support  - 3-5 x daily - 1-2 minutes hold

## 2022-08-28 ENCOUNTER — Ambulatory Visit: Payer: Medicaid Other | Admitting: Physical Therapy

## 2022-08-28 ENCOUNTER — Other Ambulatory Visit: Payer: Self-pay

## 2022-08-28 ENCOUNTER — Encounter: Payer: Self-pay | Admitting: Physical Therapy

## 2022-08-28 DIAGNOSIS — G8929 Other chronic pain: Secondary | ICD-10-CM

## 2022-08-28 DIAGNOSIS — M6281 Muscle weakness (generalized): Secondary | ICD-10-CM

## 2022-08-28 DIAGNOSIS — M25511 Pain in right shoulder: Secondary | ICD-10-CM | POA: Diagnosis not present
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
## 2022-09-06 ENCOUNTER — Ambulatory Visit: Payer: Medicaid Other | Admitting: Physical Therapy
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
## 2022-09-08 ENCOUNTER — Encounter: Payer: Self-pay | Admitting: Physical Therapy

## 2022-09-08 ENCOUNTER — Other Ambulatory Visit: Payer: Self-pay

## 2022-09-08 ENCOUNTER — Ambulatory Visit: Payer: Medicaid Other | Attending: Orthopedic Surgery | Admitting: Physical Therapy

## 2022-09-08 DIAGNOSIS — M25511 Pain in right shoulder: Secondary | ICD-10-CM | POA: Insufficient documentation

## 2022-09-08 DIAGNOSIS — G8929 Other chronic pain: Secondary | ICD-10-CM | POA: Diagnosis present

## 2022-09-08 DIAGNOSIS — M6281 Muscle weakness (generalized): Secondary | ICD-10-CM | POA: Insufficient documentation

## 2022-09-13 ENCOUNTER — Ambulatory Visit: Payer: Medicaid Other | Admitting: Physical Therapy
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
## 2022-09-15 ENCOUNTER — Ambulatory Visit: Payer: Medicaid Other | Admitting: Physical Therapy
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
## 2022-09-19 ENCOUNTER — Ambulatory Visit: Payer: Medicaid Other | Admitting: Physical Therapy
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
## 2022-09-21 ENCOUNTER — Encounter: Payer: Medicaid Other | Admitting: Physical Therapy
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
## 2022-09-22 ENCOUNTER — Other Ambulatory Visit: Payer: Self-pay

## 2022-09-22 ENCOUNTER — Encounter: Payer: Self-pay | Admitting: Physical Therapy

## 2022-09-22 ENCOUNTER — Ambulatory Visit: Payer: Medicaid Other | Admitting: Physical Therapy

## 2022-09-22 DIAGNOSIS — M25511 Pain in right shoulder: Secondary | ICD-10-CM | POA: Diagnosis not present

## 2022-09-22 DIAGNOSIS — M6281 Muscle weakness (generalized): Secondary | ICD-10-CM

## 2022-09-22 DIAGNOSIS — G8929 Other chronic pain: Secondary | ICD-10-CM

## 2022-09-22 NOTE — Patient Instructions (Signed)
Access Code: [email or web address] URL: [email or web address] Date: 09/22/2022 Prepared by: Rosana Hoes  Exercises - Step Back Shoulder Stretch with Chair  - 2 x daily - 10 reps - 5 seconds hold - Standing shoulder flexion wall slides  - 2 x daily - 2 sets - 10 reps - 5 seconds hold - Sidelying Shoulder Abduction Palm Forward  - 2 x daily - 2 sets - 10 reps - Sidelying Shoulder External Rotation  - 2 x daily - 2 sets - 10 reps

## 2022-09-25 ENCOUNTER — Ambulatory Visit: Payer: Medicaid Other | Admitting: Physical Therapy
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
## 2022-09-26 ENCOUNTER — Other Ambulatory Visit: Payer: Self-pay

## 2022-09-26 ENCOUNTER — Ambulatory Visit: Payer: Medicaid Other | Admitting: Physical Therapy

## 2022-09-26 ENCOUNTER — Encounter: Payer: Self-pay | Admitting: Physical Therapy

## 2022-09-26 DIAGNOSIS — M6281 Muscle weakness (generalized): Secondary | ICD-10-CM

## 2022-09-26 DIAGNOSIS — G8929 Other chronic pain: Secondary | ICD-10-CM

## 2022-09-26 DIAGNOSIS — M25511 Pain in right shoulder: Secondary | ICD-10-CM | POA: Diagnosis not present
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
## 2022-10-10 ENCOUNTER — Ambulatory Visit: Payer: Medicaid Other | Attending: Orthopedic Surgery | Admitting: Physical Therapy

## 2022-10-10 ENCOUNTER — Encounter: Payer: Self-pay | Admitting: Physical Therapy

## 2022-10-10 ENCOUNTER — Other Ambulatory Visit: Payer: Self-pay

## 2022-10-10 DIAGNOSIS — M25511 Pain in right shoulder: Secondary | ICD-10-CM | POA: Insufficient documentation

## 2022-10-10 DIAGNOSIS — M6281 Muscle weakness (generalized): Secondary | ICD-10-CM | POA: Diagnosis present

## 2022-10-10 DIAGNOSIS — G8929 Other chronic pain: Secondary | ICD-10-CM | POA: Diagnosis present

## 2022-10-11 ENCOUNTER — Encounter: Payer: Medicaid Other | Admitting: Physical Therapy
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
## 2022-10-17 ENCOUNTER — Ambulatory Visit: Payer: Medicaid Other | Admitting: Physical Therapy

## 2022-10-18 ENCOUNTER — Encounter: Payer: Medicaid Other | Admitting: Physical Therapy
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
## 2022-10-24 ENCOUNTER — Ambulatory Visit: Payer: Medicaid Other | Admitting: Physical Therapy

## 2022-10-25 ENCOUNTER — Encounter: Payer: Medicaid Other | Admitting: Physical Therapy

## 2022-10-31 ENCOUNTER — Ambulatory Visit: Payer: Medicaid Other | Admitting: Physical Therapy
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
## 2022-11-01 ENCOUNTER — Encounter: Payer: Medicaid Other | Admitting: Physical Therapy

## 2022-11-07 ENCOUNTER — Other Ambulatory Visit: Payer: Self-pay

## 2022-11-07 ENCOUNTER — Encounter: Payer: Self-pay | Admitting: Physical Therapy

## 2022-11-07 ENCOUNTER — Ambulatory Visit: Payer: Medicaid Other | Admitting: Physical Therapy

## 2022-11-07 DIAGNOSIS — M6281 Muscle weakness (generalized): Secondary | ICD-10-CM

## 2022-11-07 DIAGNOSIS — G8929 Other chronic pain: Secondary | ICD-10-CM

## 2022-11-07 DIAGNOSIS — M25511 Pain in right shoulder: Secondary | ICD-10-CM | POA: Diagnosis not present
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
## 2022-11-08 ENCOUNTER — Encounter: Payer: Medicaid Other | Admitting: Physical Therapy

## 2022-11-15 ENCOUNTER — Ambulatory Visit: Payer: Medicaid Other | Admitting: Physical Therapy

## 2022-11-15 ENCOUNTER — Other Ambulatory Visit: Payer: Self-pay

## 2022-11-15 ENCOUNTER — Emergency Department (HOSPITAL_BASED_OUTPATIENT_CLINIC_OR_DEPARTMENT_OTHER)
Admission: EM | Admit: 2022-11-15 | Discharge: 2022-11-15 | Disposition: A | Discharge status: Home / Self Care | Payer: Medicaid Other | Attending: Emergency Medicine | Admitting: Emergency Medicine

## 2022-11-15 DIAGNOSIS — R509 Fever, unspecified: Secondary | ICD-10-CM | POA: Diagnosis present

## 2022-11-15 DIAGNOSIS — J101 Influenza due to other identified influenza virus with other respiratory manifestations: Secondary | ICD-10-CM | POA: Diagnosis not present

## 2022-11-15 DIAGNOSIS — Z20822 Contact with and (suspected) exposure to covid-19: Secondary | ICD-10-CM | POA: Diagnosis not present

## 2022-11-15 DIAGNOSIS — Z7982 Long term (current) use of aspirin: Secondary | ICD-10-CM | POA: Insufficient documentation

## 2022-11-15 LAB — RESP PANEL BY RT-PCR (RSV, FLU A&B, COVID)  RVPGX2
Influenza A by PCR: POSITIVE — AB
Influenza B by PCR: NEGATIVE
Resp Syncytial Virus by PCR: NEGATIVE
SARS Coronavirus 2 by RT PCR: NEGATIVE

## 2022-11-15 LAB — GROUP A STREP BY PCR: Group A Strep by PCR: NOT DETECTED
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
## 2022-11-15 NOTE — ED Provider Notes (Signed)
[facility] Provider Note   CSN: [id number] Arrival date & time: 11/15/22  1227     History  Chief Complaint  Patient presents with   Fever   Sore Throat   Nasal Congestion    Carrie [name] is a 52 y.o. female.   Fever Sore Throat     This is a 52 year old female presenting today due to sore throat and fever and generalized bodyaches.  Her symptoms started 5 days ago, her daughter is sick at home with similar symptoms started 2 days ago.  She is not any antipyretics prior to arrival, denies any chest pain or shortness of breath.  She has had a nonproductive cough.  Home Medications Prior to Admission medications   Medication Sig Start Date End Date Taking? Authorizing Provider  acetaminophen (TYLENOL) 500 MG tablet Take 1,000 mg by mouth every 6 (six) hours as needed.    [provider]  ALPRAZolam Duanne Moron) 0.5 MG tablet Take 0.5 mg by mouth 3 (three) times daily as needed for anxiety.    [provider]  aspirin EC 81 MG tablet Take 1 tablet (81 mg total) by mouth 2 (two) times daily. To prevent blood clots for 30 days after surgery. 08/08/22   Britt Bottom, PA-C  butalbital-acetaminophen-caffeine (FIORICET) 50-325-40 MG tablet Take 1 tablet by mouth every 6 (six) hours as needed for migraine. 10/24/16   [provider]  diclofenac Sodium (VOLTAREN) 1 % GEL Apply 1 application topically 4 (four) times daily as needed (pain).    [provider]  doxepin (SINEQUAN) 25 MG capsule Take 1 capsule (25 mg total) by mouth at bedtime. Patient taking differently: Take 25 mg by mouth at bedtime as needed (anxiety). 01/08/17   McVey, Gelene Mink, PA-C  ECHINACEA PO Take 1 capsule by mouth at bedtime.    [provider]  fluconazole (DIFLUCAN) 150 MG tablet Take 150 mg by mouth as directed. Prn when  on antibiotics Patient not taking: Reported on 08/04/2022    [provider]   FLUoxetine (PROZAC) 40 MG capsule Take 40 mg by mouth daily.    [provider]  folic acid (FOLVITE) 1 MG tablet Take 1 mg by mouth at bedtime.    [provider]  gabapentin (NEURONTIN) 800 MG tablet Take 800 mg by mouth 3 (three) times daily.    [provider]  lamoTRIgine (LAMICTAL) 150 MG tablet Take 150 mg by mouth 2 (two) times daily. 11/16/20   [provider]  meloxicam (MOBIC) 15 MG tablet Take 1 tablet (15 mg total) by mouth daily. For pain and inflammation 08/08/22   Aggie Moats M, PA-C  ondansetron (ZOFRAN-ODT) 4 MG disintegrating tablet Take 1 tablet (4 mg total) by mouth 2 (two) times daily as needed for nausea or vomiting. 08/08/22   Britt Bottom, PA-C  oxyCODONE (ROXICODONE) 5 MG immediate release tablet Take 1 tablet (5 mg total) by mouth every 6 (six) hours as needed for severe pain. Do not take more than 6 tablets in a 24 hour period. 08/08/22   Britt Bottom, PA-C  prazosin (MINIPRESS) 2 MG capsule Take 2 mg by mouth at bedtime. 11/16/20   [provider]  SUMAtriptan (IMITREX) 50 MG tablet Take 1 tablet (50 mg total) by mouth once for 1 dose. May repeat in 2 hours if headache persists or recurs. Patient taking differently: Take 50 mg by mouth every 2 (two) hours as needed for  migraine. May repeat in 2 hours if headache persists or recurs. 09/25/17 08/08/22  McVey, Gelene Mink, PA-C  tiZANidine (ZANAFLEX) 4 MG tablet Take 4 mg by mouth 3 (three) times daily. 11/07/19   [provider]      Allergies    Patient has no known allergies.    Review of Systems   Review of Systems  Constitutional:  Positive for fever.    Physical Exam Updated Vital Signs BP 105/72 (BP Location: Left Arm)   Pulse 81   Temp 98.2 F (36.8 C) (Oral)   Resp 18   Ht 5' 8"$  (1.727 m)   Wt 67.6 kg   LMP 01/16/2017   SpO2 97%   BMI 22.66 kg/m  Physical Exam Vitals and nursing note reviewed. Exam conducted with a chaperone present.   Constitutional:      Appearance: Normal appearance.  HENT:     Head: Normocephalic and atraumatic.     Nose: Congestion present.     Mouth/Throat:     Pharynx: Uvula midline. Posterior oropharyngeal erythema present.  Eyes:     General: No scleral icterus.       Right eye: No discharge.        Left eye: No discharge.     Extraocular Movements: Extraocular movements intact.     Pupils: Pupils are equal, round, and reactive to light.  Cardiovascular:     Rate and Rhythm: Normal rate and regular rhythm.     Pulses: Normal pulses.     Heart sounds: Normal heart sounds. No murmur heard.    No friction rub. No gallop.  Pulmonary:     Effort: Pulmonary effort is normal. No respiratory distress.     Breath sounds: Normal breath sounds.  Abdominal:     General: Abdomen is flat. Bowel sounds are normal. There is no distension.     Palpations: Abdomen is soft.     Tenderness: There is no abdominal tenderness.  Skin:    General: Skin is warm and dry.     Coloration: Skin is not jaundiced.  Neurological:     Mental Status: She is alert. Mental status is at baseline.     Coordination: Coordination normal.     ED Results / Procedures / Treatments   Labs (all labs ordered are listed, but only abnormal results are displayed) Labs Reviewed  RESP PANEL BY RT-PCR (RSV, FLU A&B, COVID)  RVPGX2 - Abnormal; Notable for the following components:      Result Value   Influenza A by PCR POSITIVE (*)    All other components within normal limits  GROUP A STREP BY PCR    EKG None  Radiology No results found.  Procedures Procedures    Medications Ordered in ED Medications - No data to display  ED Course/ Medical Decision Making/ A&P                             Medical Decision Making  This is 51 year old female presents today due to viral symptoms.  On exam she is very well-appearing, afebrile.  No SIRS criteria, not septic.  No signs of other angina, peritonsillar, retropharyngeal  abscess.  Will proceed with viral panel and strep test.  Patient is influenza A positive, advised antipyretics and supportive care.  Return precaution discussed, stable for discharge at this time.        Final Clinical Impression(s) / ED Diagnoses Final diagnoses:  None  Rx / DC Orders ED Discharge Orders     None         Sherrill Raring, [place] 11/15/22 1636    Wyvonnia Dusky, MD 11/18/22 [phone]

## 2022-11-15 NOTE — ED Triage Notes (Signed)
C/O fever, nasal congestion, and sore throat since Friday; took alka seltzer around 9 am.

## 2022-11-15 NOTE — Discharge Instructions (Signed)
You have the flu. take tylenol and motrin as needed for fever.  Drink plenty of fluids, return to the ED for new or concerning symptoms.
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
## 2022-11-27 ENCOUNTER — Other Ambulatory Visit: Payer: Self-pay

## 2022-11-27 ENCOUNTER — Encounter (HOSPITAL_BASED_OUTPATIENT_CLINIC_OR_DEPARTMENT_OTHER): Payer: Self-pay | Admitting: Orthopedic Surgery

## 2022-12-01 ENCOUNTER — Encounter (HOSPITAL_BASED_OUTPATIENT_CLINIC_OR_DEPARTMENT_OTHER): Payer: Self-pay | Admitting: Orthopedic Surgery

## 2022-12-01 ENCOUNTER — Other Ambulatory Visit: Payer: Self-pay

## 2022-12-01 ENCOUNTER — Ambulatory Visit (HOSPITAL_BASED_OUTPATIENT_CLINIC_OR_DEPARTMENT_OTHER): Payer: Medicaid Other | Admitting: Anesthesiology

## 2022-12-01 ENCOUNTER — Ambulatory Visit (HOSPITAL_BASED_OUTPATIENT_CLINIC_OR_DEPARTMENT_OTHER)
Admission: RE | Admit: 2022-12-01 | Discharge: 2022-12-01 | Disposition: A | Discharge status: Home / Self Care | Payer: Medicaid Other | Attending: Orthopedic Surgery | Admitting: Orthopedic Surgery

## 2022-12-01 ENCOUNTER — Encounter (HOSPITAL_BASED_OUTPATIENT_CLINIC_OR_DEPARTMENT_OTHER)
Admission: RE | Disposition: A | Discharge status: Home / Self Care | Payer: Self-pay | Source: Home / Self Care | Attending: Orthopedic Surgery

## 2022-12-01 DIAGNOSIS — F411 Generalized anxiety disorder: Secondary | ICD-10-CM | POA: Insufficient documentation

## 2022-12-01 DIAGNOSIS — F319 Bipolar disorder, unspecified: Secondary | ICD-10-CM | POA: Insufficient documentation

## 2022-12-01 DIAGNOSIS — M19012 Primary osteoarthritis, left shoulder: Secondary | ICD-10-CM | POA: Diagnosis not present

## 2022-12-01 DIAGNOSIS — M75112 Incomplete rotator cuff tear or rupture of left shoulder, not specified as traumatic: Secondary | ICD-10-CM | POA: Diagnosis present

## 2022-12-01 DIAGNOSIS — M25812 Other specified joint disorders, left shoulder: Secondary | ICD-10-CM | POA: Insufficient documentation

## 2022-12-01 DIAGNOSIS — M75102 Unspecified rotator cuff tear or rupture of left shoulder, not specified as traumatic: Secondary | ICD-10-CM | POA: Diagnosis not present

## 2022-12-01 DIAGNOSIS — Z01818 Encounter for other preprocedural examination: Secondary | ICD-10-CM

## 2022-12-01 DIAGNOSIS — M7522 Bicipital tendinitis, left shoulder: Secondary | ICD-10-CM | POA: Insufficient documentation

## 2022-12-01 HISTORY — PX: SHOULDER ARTHROSCOPY WITH ROTATOR CUFF REPAIR AND SUBACROMIAL DECOMPRESSION: SHX5686

## 2022-12-01 SURGERY — SHOULDER ARTHROSCOPY WITH ROTATOR CUFF REPAIR AND SUBACROMIAL DECOMPRESSION
Anesthesia: General | Site: Shoulder | Laterality: Left

## 2022-12-01 MED ORDER — CEFAZOLIN SODIUM-DEXTROSE 2-4 GM/100ML-% IV SOLN
2.0000 g | INTRAVENOUS | Status: AC
  Administered 2022-12-01: 2 g via INTRAVENOUS

## 2022-12-01 MED ORDER — ACETAMINOPHEN 500 MG PO TABS
ORAL_TABLET | ORAL | Status: AC
  Filled 2022-12-01: qty 2

## 2022-12-01 MED ORDER — ROCURONIUM BROMIDE 100 MG/10ML IV SOLN
INTRAVENOUS | Status: DC | PRN
  Administered 2022-12-01: 50 mg via INTRAVENOUS

## 2022-12-01 MED ORDER — GLYCOPYRROLATE PF 0.2 MG/ML IJ SOSY
PREFILLED_SYRINGE | INTRAMUSCULAR | Status: AC
  Filled 2022-12-01: qty 1

## 2022-12-01 MED ORDER — ACETAMINOPHEN 500 MG PO TABS: 1000.0000 mg | ORAL_TABLET | Freq: Once | ORAL | Status: DC

## 2022-12-01 MED ORDER — PROPOFOL 10 MG/ML IV BOLUS
INTRAVENOUS | Status: DC | PRN
  Administered 2022-12-01: 150 mg via INTRAVENOUS

## 2022-12-01 MED ORDER — BUPIVACAINE HCL (PF) 0.25 % IJ SOLN
INTRAMUSCULAR | Status: AC
  Filled 2022-12-01: qty 30

## 2022-12-01 MED ORDER — ASPIRIN 81 MG PO TBEC: 81.0000 mg | DELAYED_RELEASE_TABLET | Freq: Two times a day (BID) | ORAL | 0 refills | Status: AC

## 2022-12-01 MED ORDER — METHYLPREDNISOLONE ACETATE 40 MG/ML IJ SUSP
INTRAMUSCULAR | Status: AC
  Filled 2022-12-01: qty 1

## 2022-12-01 MED ORDER — POVIDONE-IODINE 10 % EX SWAB: 2.0000 | Freq: Once | CUTANEOUS | Status: DC

## 2022-12-01 MED ORDER — FENTANYL CITRATE (PF) 100 MCG/2ML IJ SOLN
INTRAMUSCULAR | Status: AC
  Filled 2022-12-01: qty 2

## 2022-12-01 MED ORDER — FENTANYL CITRATE (PF) 100 MCG/2ML IJ SOLN
25.0000 ug | INTRAMUSCULAR | Status: DC | PRN
  Administered 2022-12-01 (×2): 50 ug via INTRAVENOUS

## 2022-12-01 MED ORDER — ONDANSETRON HCL 4 MG/2ML IJ SOLN
INTRAMUSCULAR | Status: AC
  Filled 2022-12-01: qty 2

## 2022-12-01 MED ORDER — DEXAMETHASONE SODIUM PHOSPHATE 10 MG/ML IJ SOLN
INTRAMUSCULAR | Status: AC
  Filled 2022-12-01: qty 1

## 2022-12-01 MED ORDER — LACTATED RINGERS IV SOLN: INTRAVENOUS | Status: DC

## 2022-12-01 MED ORDER — MIDAZOLAM HCL 2 MG/2ML IJ SOLN
2.0000 mg | Freq: Once | INTRAMUSCULAR | Status: AC
  Administered 2022-12-01: 1 mg via INTRAVENOUS

## 2022-12-01 MED ORDER — ROCURONIUM BROMIDE 10 MG/ML (PF) SYRINGE
PREFILLED_SYRINGE | INTRAVENOUS | Status: AC
  Filled 2022-12-01: qty 10

## 2022-12-01 MED ORDER — DEXAMETHASONE SODIUM PHOSPHATE 10 MG/ML IJ SOLN: 8.0000 mg | Freq: Once | INTRAMUSCULAR | Status: DC

## 2022-12-01 MED ORDER — DEXAMETHASONE SODIUM PHOSPHATE 4 MG/ML IJ SOLN
INTRAMUSCULAR | Status: DC | PRN
  Administered 2022-12-01: 5 mg via INTRAVENOUS

## 2022-12-01 MED ORDER — MIDAZOLAM HCL 2 MG/2ML IJ SOLN
INTRAMUSCULAR | Status: AC
  Filled 2022-12-01: qty 2

## 2022-12-01 MED ORDER — CEFAZOLIN SODIUM-DEXTROSE 2-4 GM/100ML-% IV SOLN
INTRAVENOUS | Status: AC
  Filled 2022-12-01: qty 100

## 2022-12-01 MED ORDER — FENTANYL CITRATE (PF) 100 MCG/2ML IJ SOLN
100.0000 ug | Freq: Once | INTRAMUSCULAR | Status: AC
  Administered 2022-12-01: 50 ug via INTRAVENOUS

## 2022-12-01 MED ORDER — ONDANSETRON HCL 4 MG/2ML IJ SOLN
INTRAMUSCULAR | Status: DC | PRN
  Administered 2022-12-01: 4 mg via INTRAVENOUS

## 2022-12-01 MED ORDER — GLYCOPYRROLATE 0.2 MG/ML IJ SOLN
INTRAMUSCULAR | Status: DC | PRN
  Administered 2022-12-01: .2 mg via INTRAVENOUS

## 2022-12-01 MED ORDER — ACETAMINOPHEN 500 MG PO TABS
1000.0000 mg | ORAL_TABLET | Freq: Once | ORAL | Status: AC
  Administered 2022-12-01: 1000 mg via ORAL

## 2022-12-01 MED ORDER — BUPIVACAINE LIPOSOME 1.3 % IJ SUSP
INTRAMUSCULAR | Status: DC | PRN
  Administered 2022-12-01: 10 mL via PERINEURAL

## 2022-12-01 MED ORDER — LIDOCAINE HCL (CARDIAC) PF 100 MG/5ML IV SOSY
PREFILLED_SYRINGE | INTRAVENOUS | Status: DC | PRN
  Administered 2022-12-01: 80 mg via INTRAVENOUS

## 2022-12-01 MED ORDER — ONDANSETRON 4 MG PO TBDP: 4.0000 mg | ORAL_TABLET | Freq: Three times a day (TID) | ORAL | 0 refills | Status: DC | PRN

## 2022-12-01 MED ORDER — FENTANYL CITRATE (PF) 100 MCG/2ML IJ SOLN
INTRAMUSCULAR | Status: DC | PRN
  Administered 2022-12-01: 50 ug via INTRAVENOUS

## 2022-12-01 MED ORDER — SUGAMMADEX SODIUM 200 MG/2ML IV SOLN
INTRAVENOUS | Status: DC | PRN
  Administered 2022-12-01: 200 mg via INTRAVENOUS

## 2022-12-01 MED ORDER — SODIUM CHLORIDE 0.9 % IR SOLN
Status: DC | PRN
  Administered 2022-12-01: 6000 mL

## 2022-12-01 MED ORDER — BUPIVACAINE HCL (PF) 0.5 % IJ SOLN
INTRAMUSCULAR | Status: DC | PRN
  Administered 2022-12-01: 10 mL via PERINEURAL

## 2022-12-01 MED ORDER — MELOXICAM 15 MG PO TABS: 15.0000 mg | ORAL_TABLET | Freq: Every day | ORAL | 0 refills | Status: DC | PRN

## 2022-12-01 MED ORDER — OXYCODONE HCL 5 MG PO TABS: 5.0000 mg | ORAL_TABLET | Freq: Four times a day (QID) | ORAL | 0 refills | Status: DC | PRN

## 2022-12-01 MED ORDER — PROPOFOL 10 MG/ML IV BOLUS
INTRAVENOUS | Status: AC
  Filled 2022-12-01: qty 20

## 2022-12-01 MED ORDER — PROMETHAZINE HCL 25 MG/ML IJ SOLN: 6.2500 mg | INTRAMUSCULAR | Status: DC | PRN

## 2022-12-01 MED ORDER — PHENYLEPHRINE HCL (PRESSORS) 10 MG/ML IV SOLN
INTRAVENOUS | Status: DC | PRN
  Administered 2022-12-01: 160 ug via INTRAVENOUS

## 2022-12-01 MED ORDER — EPHEDRINE SULFATE (PRESSORS) 50 MG/ML IJ SOLN
INTRAMUSCULAR | Status: DC | PRN
  Administered 2022-12-01 (×3): 10 mg via INTRAVENOUS

## 2022-12-01 SURGICAL SUPPLY — 51 items
AID PSTN UNV HD RSTRNT DISP (MISCELLANEOUS) ×1
ANCH SUT 2.9 PUSHLOCK ANCH (Orthopedic Implant) ×1 IMPLANT
APL PRP STRL LF DISP 70% ISPRP (MISCELLANEOUS) ×1
BLADE EXCALIBUR 4.0X13 (MISCELLANEOUS) IMPLANT
BURR OVAL 8 FLU 5.0X13 (MISCELLANEOUS) ×2 IMPLANT
CANNULA 5.75X71 LONG (CANNULA) IMPLANT
CANNULA TWIST IN 8.25X7CM (CANNULA) IMPLANT
CHLORAPREP W/TINT 26 (MISCELLANEOUS) ×2 IMPLANT
DISSECTOR  3.8MM X 13CM (MISCELLANEOUS) ×1
DISSECTOR 3.8MM X 13CM (MISCELLANEOUS) ×2 IMPLANT
DRAPE IMP U-DRAPE 54X76 (DRAPES) ×2 IMPLANT
DRAPE INCISE IOBAN 66X45 STRL (DRAPES) ×2 IMPLANT
DRAPE POUCH INSTRU U-SHP 10X18 (DRAPES) ×2 IMPLANT
DRAPE SHOULDER BEACH CHAIR (DRAPES) ×2 IMPLANT
DRAPE U-SHAPE 47X51 STRL (DRAPES) ×2 IMPLANT
DRSG EMULSION OIL 3X3 NADH (GAUZE/BANDAGES/DRESSINGS) ×2 IMPLANT
GAUZE PAD ABD 8X10 STRL (GAUZE/BANDAGES/DRESSINGS) ×2 IMPLANT
GAUZE SPONGE 4X4 12PLY STRL (GAUZE/BANDAGES/DRESSINGS) ×4 IMPLANT
GLOVE BIO SURGEON STRL SZ7.5 (GLOVE) ×2 IMPLANT
GLOVE BIOGEL PI IND STRL 7.5 (GLOVE) ×4 IMPLANT
GLOVE BIOGEL PI IND STRL 8 (GLOVE) ×2 IMPLANT
GLOVE SURG SYN 7.5  E (GLOVE) ×1
GLOVE SURG SYN 7.5 E (GLOVE) ×1 IMPLANT
GLOVE SURG SYN 7.5 PF PI (GLOVE) ×2 IMPLANT
GOWN STRL REUS W/ TWL LRG LVL3 (GOWN DISPOSABLE) ×2 IMPLANT
GOWN STRL REUS W/ TWL XL LVL3 (GOWN DISPOSABLE) ×2 IMPLANT
GOWN STRL REUS W/TWL LRG LVL3 (GOWN DISPOSABLE) ×1
GOWN STRL REUS W/TWL XL LVL3 (GOWN DISPOSABLE) ×3 IMPLANT
KIT PUSHLOCK 2.9 HIP (KITS) IMPLANT
MANIFOLD NEPTUNE II (INSTRUMENTS) ×2 IMPLANT
NDL HD SCORPION MEGA LOADER (NEEDLE) IMPLANT
NS IRRIG 1000ML POUR BTL (IV SOLUTION) IMPLANT
PACK ARTHROSCOPY DSU (CUSTOM PROCEDURE TRAY) ×2 IMPLANT
PACK BASIN DAY SURGERY FS (CUSTOM PROCEDURE TRAY) ×2 IMPLANT
PORT APPOLLO RF 90DEGREE MULTI (SURGICAL WAND) ×2 IMPLANT
RESTRAINT HEAD UNIVERSAL NS (MISCELLANEOUS) ×2 IMPLANT
SLEEVE SCD COMPRESS KNEE MED (STOCKING) ×2 IMPLANT
SLING ARM FOAM STRAP LRG (SOFTGOODS) IMPLANT
SUT ETHILON 3 0 PS 1 (SUTURE) ×2 IMPLANT
SUT FIBERWIRE #2 38 T-5 BLUE (SUTURE)
SUT MNCRL AB 4-0 PS2 18 (SUTURE) IMPLANT
SUT TIGER TAPE 7 IN WHITE (SUTURE) IMPLANT
SUTURE FIBERWR #2 38 T-5 BLUE (SUTURE) IMPLANT
SUTURE TAPE TIGERLINK 1.3MM BL (SUTURE) IMPLANT
SUTURETAPE TIGERLINK 1.3MM BL (SUTURE) ×1
SYSTEM IMPL TENODESIS LNT 2.9 (Orthopedic Implant) IMPLANT
TAPE FIBER 2MM 7IN #2 BLUE (SUTURE) IMPLANT
TOWEL GREEN STERILE FF (TOWEL DISPOSABLE) ×2 IMPLANT
TUBE CONNECTING 20X1/4 (TUBING) ×4 IMPLANT
TUBING ARTHROSCOPY IRRIG 16FT (MISCELLANEOUS) ×2 IMPLANT
WATER STERILE IRR 1000ML POUR (IV SOLUTION) ×2 IMPLANT

## 2022-12-01 NOTE — Interval H&P Note (Signed)
History and Physical Interval Note:  12/01/2022 11:09 AM  Carrie [name]  has presented today for surgery, with the diagnosis of LEFT SHOULDER [place].  The various methods of treatment have been discussed with the patient and family. After consideration of risks, benefits and other options for treatment, the patient has consented to  Procedure(s): SHOULDER ARTHROSCOPY WITH ROTATOR CUFF REPAIR, SUBACROMIAL DECOMPRESSION, DISTAL CLAVICLE EXCISION AND BICEPS TENODESIS (Left) as a surgical intervention.  The patient's history has been reviewed, patient examined, no change in status, stable for surgery.  I have reviewed the patient's chart and labs.  Questions were answered to the patient's satisfaction.     Renette Butters

## 2022-12-01 NOTE — Transfer of Care (Signed)
Immediate Anesthesia Transfer of Care Note  Patient: Carrie [name]  Procedure(s) Performed: SHOULDER ARTHROSCOPY, EXTENSIVE DEBRIDEMENT, SUBACROMIAL DECOMPRESSION, DISTAL CLAVICLE EXCISION AND BICEPS TENODESIS (Left: Shoulder)  Patient Location: PACU  Anesthesia Type:General and Regional  Level of Consciousness: awake, alert , and oriented  Airway & Oxygen Therapy: Patient Spontanous Breathing and Patient connected to face mask oxygen  Post-op Assessment: Report given to RN and Post -op Vital signs reviewed and stable  Post vital signs: Reviewed and stable  Last Vitals:  Vitals Value Taken Time  BP 127/106 12/01/22 1326  Temp    Pulse 109 12/01/22 1326  Resp 12 12/01/22 1326  SpO2 98 % 12/01/22 1326  Vitals shown include unvalidated device data.  Last Pain:  Vitals:   12/01/22 1006  TempSrc: Oral  PainSc: 10-Worst pain ever      Patients Stated Pain Goal: 10 ([id number])  Complications: No notable events documented.

## 2022-12-01 NOTE — Progress Notes (Signed)
Assisted Dr. Gifford Shave with left, interscalene , ultrasound guided block. Side rails up, monitors on throughout procedure. See vital signs in flow sheet. Tolerated Procedure well.

## 2022-12-01 NOTE — Discharge Instructions (Addendum)
POST-OPERATIVE OPIOID TAPER INSTRUCTIONS: It is important to wean off of your opioid medication as soon as possible. If you do not need pain medication after your surgery it is ok to stop day one. Opioids include: Codeine, Hydrocodone(Norco, Vicodin), Oxycodone(Percocet, oxycontin) and hydromorphone amongst others.  Long term and even short term use of opiods can cause: Increased pain response Dependence Constipation Depression Respiratory depression And more.  Withdrawal symptoms can include Flu like symptoms Nausea, vomiting And more Techniques to manage these symptoms Hydrate well Eat regular healthy meals Stay active Use relaxation techniques(deep breathing, meditating, yoga) Do Not substitute Alcohol to help with tapering If you have been on opioids for less than two weeks and do not have pain than it is ok to stop all together.  Plan to wean off of opioids This plan should start within one week post op of your joint replacement. Maintain the same interval or time between taking each dose and first decrease the dose.  Cut the total daily intake of opioids by one tablet each day Next start to increase the time between doses. The last dose that should be eliminated is the evening dose.     Post Anesthesia Home Care Instructions  Activity: Get plenty of rest for the remainder of the day. A responsible individual must stay with you for 24 hours following the procedure.  For the next 24 hours, DO NOT: -Drive a car -Paediatric nurse -Drink alcoholic beverages -Take any medication unless instructed by your physician -Make any legal decisions or sign important papers.  Meals: Start with liquid foods such as gelatin or soup. Progress to regular foods as tolerated. Avoid greasy, spicy, heavy foods. If nausea and/or vomiting occur, drink only clear liquids until the nausea and/or vomiting subsides. Call your physician if vomiting continues.  Special Instructions/Symptoms: Your  throat may feel dry or sore from the anesthesia or the breathing tube placed in your throat during surgery. If this causes discomfort, gargle with warm salt water. The discomfort should disappear within 24 hours.  If you had a scopolamine patch placed behind your ear for the management of post- operative nausea and/or vomiting:  1. The medication in the patch is effective for 72 hours, after which it should be removed.  Wrap patch in a tissue and discard in the trash. Wash hands thoroughly with soap and water. 2. You may remove the patch earlier than 72 hours if you experience unpleasant side effects which may include dry mouth, dizziness or visual disturbances. 3. Avoid touching the patch. Wash your hands with soap and water after contact with the patch.     Regional Anesthesia Blocks  1. Numbness or the inability to move the "blocked" extremity may last from 3-48 hours after placement. The length of time depends on the medication injected and your individual response to the medication. If the numbness is not going away after 48 hours, call your surgeon.  2. The extremity that is blocked will need to be protected until the numbness is gone and the  Strength has returned. Because you cannot feel it, you will need to take extra care to avoid injury. Because it may be weak, you may have difficulty moving it or using it. You may not know what position it is in without looking at it while the block is in effect.  3. For blocks in the legs and feet, returning to weight bearing and walking needs to be done carefully. You will need to wait until the numbness is entirely  gone and the strength has returned. You should be able to move your leg and foot normally before you try and bear weight or walk. You will need someone to be with you when you first try to ensure you do not fall and possibly risk injury.  4. Bruising and tenderness at the needle site are common side effects and will resolve in a few  days.  5. Persistent numbness or new problems with movement should be communicated to the surgeon or the [facility] [phone] [place] [phone]).  No tylenol until after 4:15pm today if needed.

## 2022-12-01 NOTE — Anesthesia Preprocedure Evaluation (Addendum)
Anesthesia Evaluation  Patient identified by MRN, date of birth, ID band Patient awake    Reviewed: Allergy & Precautions, NPO status , Patient's Chart, lab work & pertinent test results  Airway Mallampati: II  TM Distance: >3 FB Neck ROM: Full    Dental  (+) Dental Advisory Given, Lower Dentures, Upper Dentures   Pulmonary Current SmokerPatient did not abstain from smoking.   Pulmonary exam normal breath sounds clear to auscultation       Cardiovascular negative cardio ROS Normal cardiovascular exam Rhythm:Regular Rate:Normal     Neuro/Psych  Headaches PSYCHIATRIC DISORDERS Anxiety Depression Bipolar Disorder      GI/Hepatic negative GI ROS, Neg liver ROS,,,  Endo/Other  negative endocrine ROS    Renal/GU negative Renal ROS     Musculoskeletal  (+) Arthritis , Osteoarthritis,  LEFT SHOULDER ROTATOR CUFF TEAR   Abdominal   Peds  Hematology negative hematology ROS (+)   Anesthesia Other Findings Day of surgery medications reviewed with the patient.  Reproductive/Obstetrics                             Anesthesia Physical Anesthesia Plan  ASA: 2  Anesthesia Plan: General   Post-op Pain Management: Regional block* and Tylenol PO (pre-op)*   Induction: Intravenous  PONV Risk Score and Plan: 3 and Midazolam, Dexamethasone and Ondansetron  Airway Management Planned: Oral ETT  Additional Equipment:   Intra-op Plan:   Post-operative Plan: Extubation in OR  Informed Consent: I have reviewed the patients History and Physical, chart, labs and discussed the procedure including the risks, benefits and alternatives for the proposed anesthesia with the patient or authorized representative who has indicated his/her understanding and acceptance.     Dental advisory given  Plan Discussed with: CRNA  Anesthesia Plan Comments:         Anesthesia Quick Evaluation

## 2022-12-01 NOTE — Anesthesia Procedure Notes (Signed)
Anesthesia Regional Block: Interscalene brachial plexus block   Pre-Anesthetic Checklist: , timeout performed,  Correct Patient, Correct Site, Correct Laterality,  Correct Procedure, Correct Position, site marked,  Risks and benefits discussed,  Surgical consent,  Pre-op evaluation,  At surgeon's request and post-op pain management  Laterality: Left  Prep: chloraprep       Needles:  Injection technique: Single-shot  Needle Type: Echogenic Stimulator Needle     Needle Length: 5cm  Needle Gauge: 22     Additional Needles:   Procedures:,,,, ultrasound used (permanent image in chart),,    Narrative:  Start time: 12/01/2022 10:44 AM End time: 12/01/2022 10:52 AM Injection made incrementally with aspirations every 5 mL.  Performed by: Personally  Anesthesiologist: Santa Lighter, MD  Additional Notes: Functioning IV was confirmed and monitors were applied.  A 25m 22ga Arrow echogenic stimulator needle was used. Sterile prep and drape, hand hygiene, and sterile gloves were used.  Negative aspiration and negative test dose prior to incremental administration of local anesthetic. The patient tolerated the procedure well.  Ultrasound guidance: relevent anatomy identified, needle position confirmed, local anesthetic spread visualized around nerve(s), vascular puncture avoided.  Image printed for medical record.

## 2022-12-02 NOTE — Anesthesia Postprocedure Evaluation (Signed)
Anesthesia Post Note  Patient: Carrie [name]  Procedure(s) Performed: SHOULDER ARTHROSCOPY, EXTENSIVE DEBRIDEMENT, SUBACROMIAL DECOMPRESSION, DISTAL CLAVICLE EXCISION AND BICEPS TENODESIS (Left: Shoulder)     Patient location during evaluation: PACU Anesthesia Type: General Level of consciousness: awake and alert Pain management: pain level controlled Vital Signs Assessment: post-procedure vital signs reviewed and stable Respiratory status: spontaneous breathing, nonlabored ventilation, respiratory function stable and patient connected to nasal cannula oxygen Cardiovascular status: blood pressure returned to baseline and stable Postop Assessment: no apparent nausea or vomiting Anesthetic complications: no   No notable events documented.  Last Vitals:  Vitals:   12/01/22 1345 12/01/22 1424  BP: 116/81 127/72  Pulse: 87 88  Resp: 17 16  Temp:  36.5 C  SpO2: 98% 98%    Last Pain:  Vitals:   12/01/22 1424  TempSrc: Oral  PainSc: 0-No pain                 Santa Lighter

## 2022-12-04 ENCOUNTER — Encounter (HOSPITAL_BASED_OUTPATIENT_CLINIC_OR_DEPARTMENT_OTHER): Payer: Self-pay | Admitting: Orthopedic Surgery

## 2022-12-04 NOTE — Op Note (Signed)
12/01/2022  8:20 AM  PATIENT:  Carrie [name]    PRE-OPERATIVE DIAGNOSIS:  LEFT SHOULDER ROTATOR CUFF TEAR  POST-OPERATIVE DIAGNOSIS:  Same  PROCEDURE:  SHOULDER ARTHROSCOPY, EXTENSIVE DEBRIDEMENT, SUBACROMIAL DECOMPRESSION, DISTAL CLAVICLE EXCISION AND BICEPS TENODESIS  SURGEON:  Renette Butters, MD  ASSISTANT: Aggie Moats, PA-C, he was present and scrubbed throughout the case, critical for completion in a timely fashion, and for retraction, instrumentation, and closure.   ANESTHESIA:   General  PREOPERATIVE INDICATIONS:  Carrie [name] is a  52 y.o. female with a diagnosis of [place] who failed conservative measures and elected for surgical management.    The risks benefits and alternatives were discussed with the patient preoperatively including but not limited to the risks of infection, bleeding, nerve injury, cardiopulmonary complications, the need for revision surgery, among others, and the patient was willing to proceed.  DIAGNOSES: Left shoulder, chronic rotator cuff tear, SLAP tear, biceps tendinitis, AC arthritis, and subacromial impingement.  POST-OPERATIVE DIAGNOSIS: same  PROCEDURE: Arthroscopic extensive debridement - [place], Anterior Labrum, and subdeltoid bursa Arthroscopic distal clavicle excision [id number] Arthroscopic subacromial decompression [id number] Arthroscopic rotator cuff repair [id number] Arthroscopic biceps tenodesis [id number]   OPERATIVE FINDING: Exam under anesthesia: Normal Articular space: Normal Chondral surfaces: Normal Biceps:  frayed Subscapularis: Incomplete tear  Supraspinatus: Intact  Infraspinatus: Intact      BLOOD LOSS: minimal  COMPLICATIONS: none  OPERATIVE PROCEDURE:  Patient was identified in the preoperative holding area and site was marked by me He was transported to the operating theater and placed on the table in beach chair position taking care to pad all bony prominences. After a  preincinduction time out anesthesia was induced. The left upper extremity was prepped and draped in normal sterile fashion and a pre-incision timeout was performed. Carrie [name] received ancef for preoperative antibiotics.   Initially made a posterior arthroscopic portal and inserted the arthroscope into the glenohumeral joint. tour of the joint demonstrated the above operative findings  I created an anterior portal just lateral to the coracoid under direct visualization using a spinal needle.  I performed an extensive debridement of the scarred synovial tissue and remaining structures   I used a combination of biter and shaver to release the biceps tendon from the superior labrum and then used the shaver to debride the superior labrum to a smooth rim. I performed a loop and tack tenodesis  I then introduced the arthroscope into the subacromial space and brought the shaver into the anterior portal. I debrided the bursa for appropriate visualization.  I then performed a subacromial decompression using combination of the shaver ArthroCare and burr using a cutting block technique. As happy with the final elevation of the subacromial space on multiple portal views.   Next I turned my attention to the distal clavicle and through the anterior portal using the bur and shaver I was able to perform a distal clavicle excision. I then switched portals and inserted the arthroscope into the anterior portal and was happy with an appropriate resection of the distal clavicle.  Supraspinatus was intact  Next I removed all arthroscopic equipment expressed all fluid and closed the portals with a nylon stitch. A sterile dressing was applied the patient was taken the PACU in stable condition.  POST OPERATIVE PLAN: The patient will be in a sling full-time and keep the dressings clean dry and intact. DVT prophylaxis will consist of early ambulation

## 2022-12-11 ENCOUNTER — Emergency Department (HOSPITAL_BASED_OUTPATIENT_CLINIC_OR_DEPARTMENT_OTHER)
Admission: EM | Admit: 2022-12-11 | Discharge: 2022-12-12 | Disposition: A | Discharge status: Home / Self Care | Payer: Medicaid Other | Attending: Emergency Medicine | Admitting: Emergency Medicine

## 2022-12-11 ENCOUNTER — Emergency Department (HOSPITAL_BASED_OUTPATIENT_CLINIC_OR_DEPARTMENT_OTHER): Payer: Medicaid Other

## 2022-12-11 ENCOUNTER — Encounter (HOSPITAL_BASED_OUTPATIENT_CLINIC_OR_DEPARTMENT_OTHER): Payer: Self-pay | Admitting: Urology

## 2022-12-11 DIAGNOSIS — N898 Other specified noninflammatory disorders of vagina: Secondary | ICD-10-CM

## 2022-12-11 DIAGNOSIS — N760 Acute vaginitis: Secondary | ICD-10-CM | POA: Diagnosis not present

## 2022-12-11 DIAGNOSIS — Z7982 Long term (current) use of aspirin: Secondary | ICD-10-CM | POA: Insufficient documentation

## 2022-12-11 DIAGNOSIS — K0511 Chronic gingivitis, non-plaque induced: Secondary | ICD-10-CM | POA: Diagnosis not present

## 2022-12-11 DIAGNOSIS — B001 Herpesviral vesicular dermatitis: Secondary | ICD-10-CM | POA: Diagnosis not present

## 2022-12-11 DIAGNOSIS — B9689 Other specified bacterial agents as the cause of diseases classified elsewhere: Secondary | ICD-10-CM | POA: Diagnosis not present

## 2022-12-11 DIAGNOSIS — M25512 Pain in left shoulder: Secondary | ICD-10-CM | POA: Diagnosis present

## 2022-12-11 DIAGNOSIS — D72829 Elevated white blood cell count, unspecified: Secondary | ICD-10-CM | POA: Diagnosis not present

## 2022-12-11 DIAGNOSIS — K0510 Chronic gingivitis, plaque induced: Secondary | ICD-10-CM

## 2022-12-11 DIAGNOSIS — E876 Hypokalemia: Secondary | ICD-10-CM | POA: Diagnosis not present

## 2022-12-11 DIAGNOSIS — F1990 Other psychoactive substance use, unspecified, uncomplicated: Secondary | ICD-10-CM

## 2022-12-11 LAB — CBC WITH DIFFERENTIAL/PLATELET
Abs Immature Granulocytes: 0.03 10*3/uL (ref 0.00–0.07)
Basophils Absolute: 0.1 10*3/uL (ref 0.0–0.1)
Basophils Relative: 1 %
Eosinophils Absolute: 0.4 10*3/uL (ref 0.0–0.5)
Eosinophils Relative: 4 %
HCT: 33.2 % — ABNORMAL LOW (ref 36.0–46.0)
Hemoglobin: 11 g/dL — ABNORMAL LOW (ref 12.0–15.0)
Immature Granulocytes: 0 %
Lymphocytes Relative: 13 %
Lymphs Abs: 1.6 10*3/uL (ref 0.7–4.0)
MCH: 30.5 pg (ref 26.0–34.0)
MCHC: 33.1 g/dL (ref 30.0–36.0)
MCV: 92 fL (ref 80.0–100.0)
Monocytes Absolute: 0.7 10*3/uL (ref 0.1–1.0)
Monocytes Relative: 6 %
Neutro Abs: 8.9 10*3/uL — ABNORMAL HIGH (ref 1.7–7.7)
Neutrophils Relative %: 76 %
Platelets: 294 10*3/uL (ref 150–400)
RBC: 3.61 MIL/uL — ABNORMAL LOW (ref 3.87–5.11)
RDW: 12.4 % (ref 11.5–15.5)
WBC: 11.6 10*3/uL — ABNORMAL HIGH (ref 4.0–10.5)
nRBC: 0 % (ref 0.0–0.2)

## 2022-12-11 LAB — URINALYSIS, ROUTINE W REFLEX MICROSCOPIC
Bilirubin Urine: NEGATIVE
Glucose, UA: NEGATIVE mg/dL
Ketones, ur: NEGATIVE mg/dL
Nitrite: NEGATIVE
Protein, ur: 30 mg/dL — AB
Specific Gravity, Urine: 1.015 (ref 1.005–1.030)
pH: 5.5 (ref 5.0–8.0)

## 2022-12-11 LAB — COMPREHENSIVE METABOLIC PANEL
ALT: 14 U/L (ref 0–44)
AST: 17 U/L (ref 15–41)
Albumin: 3.4 g/dL — ABNORMAL LOW (ref 3.5–5.0)
Alkaline Phosphatase: 73 U/L (ref 38–126)
Anion gap: 7 (ref 5–15)
BUN: 7 mg/dL (ref 6–20)
CO2: 22 mmol/L (ref 22–32)
Calcium: 8.1 mg/dL — ABNORMAL LOW (ref 8.9–10.3)
Chloride: 108 mmol/L (ref 98–111)
Creatinine, Ser: 0.96 mg/dL (ref 0.44–1.00)
GFR, Estimated: >60 mL/min (ref >60)
Glucose, Bld: 80 mg/dL (ref 70–99)
Potassium: 2.7 mmol/L — CL (ref 3.5–5.1)
Sodium: 137 mmol/L (ref 135–145)
Total Bilirubin: 0.4 mg/dL (ref 0.3–1.2)
Total Protein: 7 g/dL (ref 6.5–8.1)

## 2022-12-11 LAB — MAGNESIUM: Magnesium: 2.2 mg/dL (ref 1.7–2.4)

## 2022-12-11 LAB — RAPID URINE DRUG SCREEN, HOSP PERFORMED
Amphetamines: POSITIVE — AB
Barbiturates: NOT DETECTED
Benzodiazepines: POSITIVE — AB
Cocaine: NOT DETECTED
Opiates: NOT DETECTED
Tetrahydrocannabinol: POSITIVE — AB

## 2022-12-11 LAB — URINALYSIS, MICROSCOPIC (REFLEX)

## 2022-12-11 LAB — WET PREP, GENITAL
Sperm: NONE SEEN
Trich, Wet Prep: NONE SEEN
WBC, Wet Prep HPF POC: <10 (ref <10)
Yeast Wet Prep HPF POC: NONE SEEN

## 2022-12-11 MED ORDER — POTASSIUM CHLORIDE 10 MEQ/100ML IV SOLN
10.0000 meq | INTRAVENOUS | Status: AC
  Administered 2022-12-11 (×3): 10 meq via INTRAVENOUS
  Filled 2022-12-11 (×3): qty 100

## 2022-12-11 MED ORDER — POTASSIUM CHLORIDE CRYS ER 20 MEQ PO TBCR
40.0000 meq | EXTENDED_RELEASE_TABLET | Freq: Once | ORAL | Status: AC
  Administered 2022-12-11: 40 meq via ORAL
  Filled 2022-12-11: qty 2

## 2022-12-11 NOTE — ED Notes (Signed)
Pt is very sleepy and when the IV beeps she states that is my phone , states has been so tired

## 2022-12-11 NOTE — ED Provider Notes (Signed)
[facility] Provider Note   CSN: [id number] Arrival date & time: 12/11/22  1252     History Chief Complaint  Patient presents with   Multiple Complaints    Carrie [name] is a 52 y.o. female with history of hyperlipidemia, migraines, bipolar presents the emergency room today for evaluation of multiple complaints.  Firstly, patient reports that she thinks she may have a yeast infection.  She reports that she is not having white clumpy discharge with some mild itching and also a foul smell.  She has a hysterectomy with removal of her cervix as well.  Denies any concern for any STDs.  No dysuria or hematuria or abdominal pain.  Additionally, patient reports that she has been really stressed lately given that her husband's been in the hospital for over 2 weeks.  She reports that she feels like she is lost 22 pounds in 4 weeks as well.  Thirdly, patient is complaining about sores around her mouth and in her mouth and gums.  She has full dentures and does not take them out whenever she sleeps even though she has been told multiple times by dental professionals.  She reports that she has gotten cold sores before but does not know why she is gotten them today.  She reports that her husband has staph and strep bacteremia and is concerned that kissing him may have given her this again.  She reports that she has been given Augmentin for the sores in her mouth before and this usually helps.  Lastly, she mentions that she feels like her shoulder wounds are getting infected from her recent shoulder surgery on 12-01-2022.  She was asked to go to her postop appointment today however missed it as she thought she was to get an MRI.  She is was to see Dr. Percell Miller again in 2 weeks.  She reports that she does vape daily.  Only admits to marijuana use.  Denies any EtOH use.  HPI     Home Medications Prior to Admission medications   Medication Sig Start Date End Date  Taking? Authorizing Provider  amoxicillin-clavulanate (AUGMENTIN) 875-125 MG tablet Take 1 tablet by mouth every 12 (twelve) hours. 12/12/22  Yes Sherrell Puller, PA-C  fluconazole (DIFLUCAN) 150 MG tablet Take 1 tablet (150 mg total) by mouth daily. 12/12/22  Yes Sherrell Puller, PA-C  lidocaine (XYLOCAINE) 2 % solution Use as directed 15 mLs in the mouth or throat as needed for mouth pain. 12/12/22  Yes Sherrell Puller, PA-C  metroNIDAZOLE (FLAGYL) 500 MG tablet Take 1 tablet (500 mg total) by mouth 3 (three) times daily for 7 days. 12/12/22 12/19/22 Yes Sherrell Puller, PA-C  acetaminophen (TYLENOL) 500 MG tablet Take 1,000 mg by mouth every 6 (six) hours as needed.    [provider]  ALPRAZolam Duanne Moron) 0.5 MG tablet Take 0.5 mg by mouth 3 (three) times daily as needed for anxiety.    [provider]  aspirin EC 81 MG tablet Take 1 tablet (81 mg total) by mouth 2 (two) times daily. To prevent blood clots for 30 days after surgery. 12/01/22   Britt Bottom, PA-C  butalbital-acetaminophen-caffeine (FIORICET) 50-325-40 MG tablet Take 1 tablet by mouth every 6 (six) hours as needed for migraine. 10/24/16   [provider]  diclofenac Sodium (VOLTAREN) 1 % GEL Apply 1 application topically 4 (four) times daily as needed (pain).    [provider]  doxepin (SINEQUAN) 25 MG capsule Take  1 capsule (25 mg total) by mouth at bedtime. Patient taking differently: Take 25 mg by mouth at bedtime as needed (anxiety). 01/08/17   McVey, Gelene Mink, PA-C  ECHINACEA PO Take 1 capsule by mouth at bedtime.    [provider]  FLUoxetine (PROZAC) 40 MG capsule Take 40 mg by mouth daily.    [provider]  folic acid (FOLVITE) 1 MG tablet Take 1 mg by mouth at bedtime.    [provider]  gabapentin (NEURONTIN) 800 MG tablet Take 800 mg by mouth 3 (three) times daily.    [provider]  lamoTRIgine (LAMICTAL) 150 MG tablet Take 150 mg by mouth 2 (two) times  daily. 11/16/20   [provider]  meloxicam (MOBIC) 15 MG tablet Take 1 tablet (15 mg total) by mouth daily as needed for pain (and inflammation). 12/01/22   Britt Bottom, PA-C  ondansetron (ZOFRAN-ODT) 4 MG disintegrating tablet Take 1 tablet (4 mg total) by mouth every 8 (eight) hours as needed for nausea or vomiting. 12/01/22   Britt Bottom, PA-C  oxyCODONE (ROXICODONE) 5 MG immediate release tablet Take 1 tablet (5 mg total) by mouth every 6 (six) hours as needed for severe pain. 12/01/22   Britt Bottom, PA-C  prazosin (MINIPRESS) 2 MG capsule Take 2 mg by mouth at bedtime. 11/16/20   [provider]  SUMAtriptan (IMITREX) 50 MG tablet Take 1 tablet (50 mg total) by mouth once for 1 dose. May repeat in 2 hours if headache persists or recurs. Patient taking differently: Take 50 mg by mouth every 2 (two) hours as needed for migraine. May repeat in 2 hours if headache persists or recurs. 09/25/17 08/08/22  McVey, Gelene Mink, PA-C      Allergies    Patient has no known allergies.    Review of Systems   Review of Systems  Constitutional:  Positive for unexpected weight change. Negative for chills and fever.  HENT:  Positive for mouth sores. Negative for congestion, sore throat and trouble swallowing.   Respiratory:  Negative for cough and shortness of breath.   Cardiovascular:  Negative for chest pain.  Gastrointestinal:  Negative for abdominal pain, nausea and vomiting.  Genitourinary:  Positive for vaginal discharge. Negative for dysuria and hematuria.  Musculoskeletal:  Positive for arthralgias. Negative for back pain and neck pain.  Neurological:  Negative for speech difficulty and headaches.    Physical Exam Updated Vital Signs BP 112/60 (BP Location: Right Arm)   Pulse 70   Temp 98.2 F (36.8 C) (Oral)   Resp 17   Ht 5\' 8"  (1.727 m)   Wt 57.5 kg   LMP 01/16/2017   SpO2 98%   BMI 19.28 kg/m  Physical Exam Vitals and nursing note reviewed. Exam  conducted with a chaperone present Shirlean Mylar, tach).  Constitutional:      General: She is not in acute distress.    Appearance: She is not toxic-appearing.     Comments: Somnolent, but awakens to voice.  She was eating cheese its, honey bun, and Dr. Malachi Bonds at bedside.  Appears in no acute distress.  She appears to be under the influence as her movements are very fluid and her speech is a little slowed.  HENT:     Mouth/Throat:     Mouth: Mucous membranes are moist.     Comments: Herpetic lesions seen to the upper lip, about 3 and count.  She does have sores noted to the lower gums  1 to the outer left back and 1 to the back central side.  No sore seen in the posterior pharyngeal pharynx.  Uvula midline.  Airway patent.  Controlling secretions. Eyes:     General: No scleral icterus.    Extraocular Movements: Extraocular movements intact.     Pupils: Pupils are equal, round, and reactive to light.  Cardiovascular:     Rate and Rhythm: Normal rate.  Pulmonary:     Effort: Pulmonary effort is normal. No respiratory distress.  Abdominal:     Palpations: Abdomen is soft.     Tenderness: There is no abdominal tenderness. There is no guarding or rebound.  Genitourinary:    Comments: Small amount of thick white discharge noted.  No other lesions or rash noted.  No folliculitis seen Musculoskeletal:        General: Tenderness present.     Cervical back: Normal range of motion. No rigidity or tenderness.     Comments: Tenderness to the left shoulder.  Incisions do have increased erythema and warmth.  No significant induration or fluctuance appreciated.  There is a small pus pocket noted to one of the sutures.  Please see image.  Patient does have some motion of her shoulder although limited due to postoperative pain.  Low suspicion for any septic arthritis at this time.  No joint swelling appreciated.  Patient's neurovascular intact distally.  Palpable pulses.  Compartments are soft.  Sensation intact.   Lymphadenopathy:     Cervical: No cervical adenopathy.  Skin:    General: Skin is warm and dry.  Neurological:     General: No focal deficit present.          ED Results / Procedures / Treatments   Labs (all labs ordered are listed, but only abnormal results are displayed) Labs Reviewed  WET PREP, GENITAL - Abnormal; Notable for the following components:      Result Value   Clue Cells Wet Prep HPF POC PRESENT (*)    All other components within normal limits  URINALYSIS, ROUTINE W REFLEX MICROSCOPIC - Abnormal; Notable for the following components:   APPearance CLOUDY (*)    Hgb urine dipstick MODERATE (*)    Protein, ur 30 (*)    Leukocytes,Ua TRACE (*)    All other components within normal limits  URINALYSIS, MICROSCOPIC (REFLEX) - Abnormal; Notable for the following components:   Bacteria, UA FEW (*)    All other components within normal limits  RAPID URINE DRUG SCREEN, HOSP PERFORMED - Abnormal; Notable for the following components:   Benzodiazepines POSITIVE (*)    Amphetamines POSITIVE (*)    Tetrahydrocannabinol POSITIVE (*)    All other components within normal limits  CBC WITH DIFFERENTIAL/PLATELET - Abnormal; Notable for the following components:   WBC 11.6 (*)    RBC 3.61 (*)    Hemoglobin 11.0 (*)    HCT 33.2 (*)    Neutro Abs 8.9 (*)    All other components within normal limits  COMPREHENSIVE METABOLIC PANEL - Abnormal; Notable for the following components:   Potassium 2.7 (*)    Calcium 8.1 (*)    Albumin 3.4 (*)    All other components within normal limits  MAGNESIUM    EKG EKG Interpretation  Date/Time:  Monday December 11 2022 17:57:54 EST Ventricular Rate:  68 PR Interval:  146 QRS Duration: 99 QT Interval:  446 QTC Calculation: 475 R Axis:   75 Text Interpretation: Sinus rhythm no acute ST/T changes No old  tracing to compare Confirmed by Sherwood Gambler [phone]) on 12/11/2022 6:24:30 PM  Radiology DG Shoulder Left  Result Date:  12/11/2022 CLINICAL DATA:  Pain, status post surgery EXAM: LEFT SHOULDER - 2+ VIEW COMPARISON:  None Available. FINDINGS: No fracture or dislocation is seen. The joint spaces are preserved. Visualized soft tissues are within normal limits. Visualized left lung is clear. IMPRESSION: Negative. Electronically Signed   By: Julian Hy M.D.   On: 12/11/2022 23:07     Procedures Procedures   Medications Ordered in ED Medications  potassium chloride SA (KLOR-CON M) CR tablet 40 mEq (40 mEq Oral Given 12/11/22 1749)  potassium chloride 10 mEq in 100 mL IVPB (0 mEq Intravenous Stopped 12/11/22 2137)    ED Course/ Medical Decision Making/ A&P                           Medical Decision Making Amount and/or Complexity of Data Reviewed Labs: ordered. Radiology: ordered.  Risk Prescription drug management.   52 year old female presents emerged from today for evaluation of multiple plaints including vaginal discharge, weight loss from stress, sores in mouth, shoulder pain after surgery.  Differential diagnosis is broad at this time include everything such as bacterial vaginosis, yeast vaginitis, ulcerative gingivitis, herpes simplex, malignancy, weight loss, septic arthritis, incision infection.  This is a limited list and is not all inclusive.  Vital signs are unremarkable.  Patient normotensive, afebrile, normal pulse rate, satting well room air without any fevers or breathing.  Physical exam as noted above.  Labs and imaging ordered. Patient appears to be under the influence of a substance. Ordered UDS in addition.  I independently reviewed and interpreted the patient's labs.  Wet prep positive for clue cells.  CBC does show slightly elevated white blood cell count 11.6 with a left shift.  Hemoglobin 11.  Urinalysis shows cloudy urine with mod amount of hemoglobin and 30 protein with trace leukocytes.  There is few bacteria and 6-10 red blood cells but also 6-10 white blood cells but also 6-10  squamous epithelial present.  Patient not having any urinary symptoms or abdominal pain.  Likely dirty catch.  CMP shows significant hypokalemia with a potassium of 2.7.  This is a new finding for the patient.  Mildly decreased calcium and albumin as well.  No other electrolyte or LFT abnormality.  Magnesium within normal limits.    UDS is positive for benzodiazepines THC and amphetamines.  From looking at the [place], patient is prescribed Xanax.  She admits to [facility] use.  She is not prescribed any prescribed amphetamines such as Vyvanse or Adderall.  Meth use could be from the unintended weight loss or from stress from her husband's hospitalization.  She does have a primary care doctor that she can follow-up with about this further, I do not see any emergent cause for investigation at this time.  For her hypokalemia, she was replenished with oral potassium as well as rounds of IV potassium hopefully bring her numbers back up.  Unsure of why she has such significant hypokalemia as this is never a problem for her for 4.  Could be from drug use although patient denies any alcohol use as well.  Will follow-up with her PCP about this as well.  For her cold sores, patient reports that she does have a history of these however has not had a cold sore in over a year.  She reports that she has not taken Valtrex for  these previously.  Will send her home with some Valtrex and recommended Abreva.  Wet prep shows clue cells, given patient's foul odor with vaginal discharge, likely bacterial vaginosis.  Will prescribe her Flagyl to help with this and her other complaints.  For her canker sores and mouth sores, this is likely ulcerative gingivitis as she reports that she usually takes Augmentin for this problem as well.  Will prescribe her some lidocaine wash as well as some Augmentin and metronidazole that will also help with her bacterial vaginosis as well.  Lastly, her shoulder x-ray is unremarkable.  I spoke with Dr.  Stephens November about this patient and shared with him the images of the patient's shoulder.  He does not recommend me taking the sutures out at this time.  Recommends that I can place her on a antibiotic for gram-positive coverage.  He will also follow-up with the patient to see if he can see her in clinic tomorrow.  After consideration of the diagnostic results and the patients response to treatment, I feel that the patient can be discharged home.  Patient is now more responsive and appears to have metabolized what ever she was on previously.  She is more interactive and appropriate in conversation with me.  I do not think the patient has SJS or TENS with the cold sores.  Does not appear to be shingles like in appearance as well.  It is not involving her pharynx and she is tolerating p.o.  Again, we will prescribe her some Valtrex for this and recommend Abreva.  I discussed with her about needing to remove her dentures at night as she is intolerable times by dentist before as will prevent the ulcers forming in her mouth.  For this, we will prescribe her the Augmentin as well as a viscous lidocaine.  For her left shoulder, I discussed with her the conversation I had with Dr. Birdena Jubilee about how she needs to follow-up in clinic and take the antibiotic that will also help for her gingivitis.  For her vaginal discharge, she did have some clue cells seen.  Likely bacterial vaginosis will need to treat with Flagyl.  Will also give the patient some Diflucan she is because she reports this feels more he is infection although no yeast was seen.  She can take this after antibiotics given that she reports that she is prone to yeast infections.  I do not think that she has a septic arthritis at this time as she does have ranges motion however is limited mostly due to postoperative pain.  She is neurovascularly intact distally as well.  Compartments are soft. Discussed that her weight loss could be from drugs or from her  recent stress however she can follow-up with her primary care doctor for further evaluation on this. We discussed the results of the labs/imaging. The plan is to take the prescribed medications and follow-up with appropriate specialists included in the discharge paperwork. We discussed strict return precautions and red flag symptoms. The patient verbalized their understanding and agrees to the plan. The patient is stable and being discharged home in good condition.  Portions of this report may have been transcribed using voice recognition software. Every effort was made to ensure accuracy; however, inadvertent computerized transcription errors may be present.   Final Clinical Impression(s) / ED Diagnoses Final diagnoses:  Acute pain of left shoulder  Vaginal itching  Hypokalemia  Polysubstance use disorder  Cold sore  Ulcerative gingivitis  Bacterial vaginosis    Rx /  DC Orders ED Discharge Orders          Ordered    metroNIDAZOLE (FLAGYL) 500 MG tablet  3 times daily        12/12/22 0039    amoxicillin-clavulanate (AUGMENTIN) 875-125 MG tablet  Every 12 hours        12/12/22 0039    valACYclovir (VALTREX) 1000 MG tablet  2 times daily        12/12/22 0039    fluconazole (DIFLUCAN) 150 MG tablet  Daily        12/12/22 0039    lidocaine (XYLOCAINE) 2 % solution  As needed        12/12/22 0039              Sherrell Puller, PA-C 12/14/22 0158    Sherwood Gambler, MD 12/21/22 [phone]

## 2022-12-11 NOTE — ED Triage Notes (Signed)
Pt states had rotator cuff sx and sutures are inflamed  Pt woke up with rash around mouth and inside of mouth yesterday Also c/o yeast infection   Pt concern for staph infection because she is around husband with same  Pt tearful and concern for wt loss of 20 lbs  Pt wants augmentin 800 and diflucan

## 2022-12-11 NOTE — ED Notes (Signed)
Walked in room had to speak rather loudly to wake up pt she had open chip bags on the bed beside her, she did rouse to  loud voice

## 2022-12-12 ENCOUNTER — Other Ambulatory Visit: Payer: Self-pay

## 2022-12-12 MED ORDER — FLUCONAZOLE 150 MG PO TABS: 150.0000 mg | ORAL_TABLET | Freq: Every day | ORAL | 0 refills | Status: DC

## 2022-12-12 MED ORDER — METRONIDAZOLE 500 MG PO TABS: 500.0000 mg | ORAL_TABLET | Freq: Three times a day (TID) | ORAL | 0 refills | Status: AC

## 2022-12-12 MED ORDER — VALACYCLOVIR HCL 1 G PO TABS: 2000.0000 mg | ORAL_TABLET | Freq: Two times a day (BID) | ORAL | 0 refills | Status: AC

## 2022-12-12 MED ORDER — LIDOCAINE VISCOUS HCL 2 % MT SOLN: 15.0000 mL | OROMUCOSAL | 0 refills | Status: AC | PRN

## 2022-12-12 MED ORDER — AMOXICILLIN-POT CLAVULANATE 875-125 MG PO TABS: 1.0000 | ORAL_TABLET | Freq: Two times a day (BID) | ORAL | 0 refills | Status: DC

## 2022-12-12 NOTE — Discharge Instructions (Addendum)
You were seen in the emergency department for evaluation of multiple complaints.  You have clue cells seen on your swab which is likely signs of bacterial vaginosis.  Also put you on a Diflucan as well for possible yeast.  Your incision sites do look mildly infected.  I spoken with your surgery specialty and they would like for you to follow-up tomorrow in clinic. Please call them tomorrow to be seen tomorrow. In the meantime, I'm going to place you on another antibiotic for infection prevention.  Your potassium is significantly low today, however we were able to replenish this for you.  Please make sure you follow-up with your primary care doctor for why her potassium is low.  Additionally, for your cold sores I recommended topical like Abreva.  I will place you on some valacyclovir to help with this outbreak as well.  If you have any concerns, new or worsening symptoms, please return to the nearest emergency department for reevaluation.
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
## 2023-02-01 ENCOUNTER — Encounter: Payer: Self-pay | Admitting: Physical Therapy

## 2023-02-01 ENCOUNTER — Ambulatory Visit: Payer: Medicaid Other | Attending: Orthopedic Surgery | Admitting: Physical Therapy

## 2023-02-01 ENCOUNTER — Other Ambulatory Visit: Payer: Self-pay

## 2023-02-01 DIAGNOSIS — M6281 Muscle weakness (generalized): Secondary | ICD-10-CM | POA: Insufficient documentation

## 2023-02-01 DIAGNOSIS — M25512 Pain in left shoulder: Secondary | ICD-10-CM | POA: Diagnosis present

## 2023-02-01 DIAGNOSIS — G8929 Other chronic pain: Secondary | ICD-10-CM | POA: Insufficient documentation

## 2023-02-01 NOTE — Patient Instructions (Signed)
Access Code: [email or web address] URL: [email or web address] Date: 02/01/2023 Prepared by: Rosana Hoes  Exercises - Step Back Shoulder Stretch with Chair  - 2-3 x daily - 10 reps - 5 seconds hold - Standing Single Arm Shoulder Flexion Towel Slide at Table Top  - 2-3 x daily - 10 reps - 5 seconds hold - Standing Shoulder External Rotation Stretch in Doorway  - 2-3 x daily - 10 reps - 5 seconds hold

## 2023-02-06 ENCOUNTER — Ambulatory Visit: Payer: Medicaid Other | Admitting: Physical Therapy

## 2023-02-06 ENCOUNTER — Telehealth: Payer: Self-pay | Admitting: Physical Therapy

## 2023-02-06 NOTE — Telephone Encounter (Signed)
Attempted to contact patient due to missed PT appointment. Left VM informing patient of missed appointment, offered appointment at later time today, and reminded her of next scheduled appointment. Reminded of attendance policy.  Rosana Hoes, PT, DPT, LAT, ATC 02/06/23  11:57 AM Phone: [phone] Fax: [phone]

## 2023-02-22 ENCOUNTER — Ambulatory Visit: Payer: Medicaid Other | Attending: Orthopedic Surgery | Admitting: Physical Therapy

## 2023-02-22 ENCOUNTER — Telehealth: Payer: Self-pay | Admitting: Physical Therapy

## 2023-02-22 NOTE — Telephone Encounter (Signed)
Contacted patient due to missed PT appointment. Patient reports she forgot about the appointment today due to helping her husband and having her dates mixed up. She was scheduled for an appointment on 02/26/2023 at 1:15pm and she was reminded of attendance policy. Patient expressed understanding.   Rosana Hoes, PT, DPT, LAT, ATC 02/22/23  3:05 PM Phone: [phone] Fax: [phone]

## 2023-03-02 ENCOUNTER — Emergency Department (HOSPITAL_BASED_OUTPATIENT_CLINIC_OR_DEPARTMENT_OTHER)
Admission: EM | Admit: 2023-03-02 | Discharge: 2023-03-02 | Disposition: A | Discharge status: Home / Self Care | Payer: Medicaid Other | Attending: Emergency Medicine | Admitting: Emergency Medicine

## 2023-03-02 ENCOUNTER — Emergency Department (HOSPITAL_BASED_OUTPATIENT_CLINIC_OR_DEPARTMENT_OTHER): Payer: Medicaid Other

## 2023-03-02 ENCOUNTER — Other Ambulatory Visit: Payer: Self-pay

## 2023-03-02 ENCOUNTER — Encounter (HOSPITAL_BASED_OUTPATIENT_CLINIC_OR_DEPARTMENT_OTHER): Payer: Self-pay | Admitting: Emergency Medicine

## 2023-03-02 DIAGNOSIS — Y92410 Unspecified street and highway as the place of occurrence of the external cause: Secondary | ICD-10-CM | POA: Diagnosis not present

## 2023-03-02 DIAGNOSIS — S199XXA Unspecified injury of neck, initial encounter: Secondary | ICD-10-CM | POA: Diagnosis present

## 2023-03-02 DIAGNOSIS — Z7982 Long term (current) use of aspirin: Secondary | ICD-10-CM | POA: Insufficient documentation

## 2023-03-02 DIAGNOSIS — T07XXXA Unspecified multiple injuries, initial encounter: Secondary | ICD-10-CM | POA: Diagnosis not present

## 2023-03-02 DIAGNOSIS — Z87891 Personal history of nicotine dependence: Secondary | ICD-10-CM | POA: Diagnosis not present

## 2023-03-02 DIAGNOSIS — S161XXA Strain of muscle, fascia and tendon at neck level, initial encounter: Secondary | ICD-10-CM | POA: Diagnosis not present

## 2023-03-02 LAB — BASIC METABOLIC PANEL
Anion gap: 8 (ref 5–15)
BUN: 6 mg/dL (ref 6–20)
CO2: 21 mmol/L — ABNORMAL LOW (ref 22–32)
Calcium: 8.4 mg/dL — ABNORMAL LOW (ref 8.9–10.3)
Chloride: 109 mmol/L (ref 98–111)
Creatinine, Ser: 0.74 mg/dL (ref 0.44–1.00)
GFR, Estimated: >60 mL/min (ref >60)
Glucose, Bld: 102 mg/dL — ABNORMAL HIGH (ref 70–99)
Potassium: 3.6 mmol/L (ref 3.5–5.1)
Sodium: 138 mmol/L (ref 135–145)

## 2023-03-02 LAB — URINALYSIS, ROUTINE W REFLEX MICROSCOPIC
Bilirubin Urine: NEGATIVE
Glucose, UA: NEGATIVE mg/dL
Ketones, ur: NEGATIVE mg/dL
Leukocytes,Ua: NEGATIVE
Nitrite: NEGATIVE
Protein, ur: NEGATIVE mg/dL
Specific Gravity, Urine: 1.01 (ref 1.005–1.030)
pH: 5.5 (ref 5.0–8.0)

## 2023-03-02 LAB — CBC WITH DIFFERENTIAL/PLATELET
Abs Immature Granulocytes: 0.02 10*3/uL (ref 0.00–0.07)
Basophils Absolute: 0 10*3/uL (ref 0.0–0.1)
Basophils Relative: 1 %
Eosinophils Absolute: 0.3 10*3/uL (ref 0.0–0.5)
Eosinophils Relative: 4 %
HCT: 35.1 % — ABNORMAL LOW (ref 36.0–46.0)
Hemoglobin: 11.3 g/dL — ABNORMAL LOW (ref 12.0–15.0)
Immature Granulocytes: 0 %
Lymphocytes Relative: 27 %
Lymphs Abs: 1.9 10*3/uL (ref 0.7–4.0)
MCH: 30.8 pg (ref 26.0–34.0)
MCHC: 32.2 g/dL (ref 30.0–36.0)
MCV: 95.6 fL (ref 80.0–100.0)
Monocytes Absolute: 0.5 10*3/uL (ref 0.1–1.0)
Monocytes Relative: 7 %
Neutro Abs: 4.3 10*3/uL (ref 1.7–7.7)
Neutrophils Relative %: 61 %
Platelets: 262 10*3/uL (ref 150–400)
RBC: 3.67 MIL/uL — ABNORMAL LOW (ref 3.87–5.11)
RDW: 11.9 % (ref 11.5–15.5)
WBC: 7 10*3/uL (ref 4.0–10.5)
nRBC: 0 % (ref 0.0–0.2)

## 2023-03-02 LAB — URINALYSIS, MICROSCOPIC (REFLEX): Bacteria, UA: NONE SEEN

## 2023-03-02 MED ORDER — HYDROCODONE-ACETAMINOPHEN 5-325 MG PO TABS
1.0000 | ORAL_TABLET | Freq: Once | ORAL | Status: AC
  Administered 2023-03-02: 1 via ORAL
  Filled 2023-03-02: qty 1

## 2023-03-02 MED ORDER — NAPROXEN 375 MG PO TABS: ORAL_TABLET | ORAL | 0 refills | Status: AC

## 2023-03-02 MED ORDER — NAPROXEN 250 MG PO TABS
500.0000 mg | ORAL_TABLET | Freq: Once | ORAL | Status: AC
  Administered 2023-03-02: 500 mg via ORAL
  Filled 2023-03-02: qty 2

## 2023-03-02 MED ORDER — IOHEXOL 300 MG/ML  SOLN
100.0000 mL | Freq: Once | INTRAMUSCULAR | Status: AC | PRN
  Administered 2023-03-02: 100 mL via INTRAVENOUS

## 2023-03-02 MED ORDER — HYDROCODONE-ACETAMINOPHEN 5-325 MG PO TABS: 1.0000 | ORAL_TABLET | Freq: Four times a day (QID) | ORAL | 0 refills | Status: DC | PRN
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
## 2023-03-08 ENCOUNTER — Ambulatory Visit: Payer: Medicaid Other | Admitting: Physical Therapy

## 2023-03-16 ENCOUNTER — Ambulatory Visit: Payer: Medicaid Other | Attending: Orthopedic Surgery | Admitting: Physical Therapy

## 2024-02-10 ENCOUNTER — Other Ambulatory Visit: Payer: Self-pay

## 2024-02-10 ENCOUNTER — Emergency Department (HOSPITAL_COMMUNITY)
Admission: EM | Admit: 2024-02-10 | Discharge: 2024-02-10 | Disposition: A | Discharge status: Home / Self Care | Attending: Emergency Medicine | Admitting: Emergency Medicine

## 2024-02-10 ENCOUNTER — Emergency Department (HOSPITAL_COMMUNITY)

## 2024-02-10 DIAGNOSIS — R112 Nausea with vomiting, unspecified: Secondary | ICD-10-CM | POA: Insufficient documentation

## 2024-02-10 DIAGNOSIS — R519 Headache, unspecified: Secondary | ICD-10-CM | POA: Diagnosis present

## 2024-02-10 DIAGNOSIS — Z7982 Long term (current) use of aspirin: Secondary | ICD-10-CM | POA: Diagnosis not present

## 2024-02-10 LAB — CBC WITH DIFFERENTIAL/PLATELET
Abs Immature Granulocytes: 0.03 10*3/uL (ref 0.00–0.07)
Basophils Absolute: 0 10*3/uL (ref 0.0–0.1)
Basophils Relative: 0 %
Eosinophils Absolute: 0 10*3/uL (ref 0.0–0.5)
Eosinophils Relative: 0 %
HCT: 39.3 % (ref 36.0–46.0)
Hemoglobin: 13 g/dL (ref 12.0–15.0)
Immature Granulocytes: 0 %
Lymphocytes Relative: 12 %
Lymphs Abs: 1.2 10*3/uL (ref 0.7–4.0)
MCH: 30.5 pg (ref 26.0–34.0)
MCHC: 33.1 g/dL (ref 30.0–36.0)
MCV: 92.3 fL (ref 80.0–100.0)
Monocytes Absolute: 0.5 10*3/uL (ref 0.1–1.0)
Monocytes Relative: 5 %
Neutro Abs: 8.1 10*3/uL — ABNORMAL HIGH (ref 1.7–7.7)
Neutrophils Relative %: 83 %
Platelets: 344 10*3/uL (ref 150–400)
RBC: 4.26 MIL/uL (ref 3.87–5.11)
RDW: 11.8 % (ref 11.5–15.5)
WBC: 9.8 10*3/uL (ref 4.0–10.5)
nRBC: 0 % (ref 0.0–0.2)

## 2024-02-10 LAB — BASIC METABOLIC PANEL WITH GFR
Anion gap: 12 (ref 5–15)
BUN: 7 mg/dL (ref 6–20)
CO2: 24 mmol/L (ref 22–32)
Calcium: 9.2 mg/dL (ref 8.9–10.3)
Chloride: 103 mmol/L (ref 98–111)
Creatinine, Ser: 0.52 mg/dL (ref 0.44–1.00)
GFR, Estimated: >60 mL/min (ref >60)
Glucose, Bld: 119 mg/dL — ABNORMAL HIGH (ref 70–99)
Potassium: 3 mmol/L — ABNORMAL LOW (ref 3.5–5.1)
Sodium: 139 mmol/L (ref 135–145)

## 2024-02-10 MED ORDER — IOHEXOL 350 MG/ML SOLN
100.0000 mL | Freq: Once | INTRAVENOUS | Status: AC | PRN
  Administered 2024-02-10: 100 mL via INTRAVENOUS

## 2024-02-10 MED ORDER — POTASSIUM CHLORIDE CRYS ER 20 MEQ PO TBCR
40.0000 meq | EXTENDED_RELEASE_TABLET | Freq: Once | ORAL | Status: AC
  Administered 2024-02-10: 40 meq via ORAL
  Filled 2024-02-10: qty 2

## 2024-02-10 MED ORDER — DEXAMETHASONE SODIUM PHOSPHATE 10 MG/ML IJ SOLN
10.0000 mg | Freq: Once | INTRAMUSCULAR | Status: AC
  Administered 2024-02-10: 10 mg via INTRAVENOUS
  Filled 2024-02-10: qty 1

## 2024-02-10 MED ORDER — METOCLOPRAMIDE HCL 5 MG/ML IJ SOLN
10.0000 mg | Freq: Once | INTRAMUSCULAR | Status: AC
  Administered 2024-02-10: 10 mg via INTRAVENOUS
  Filled 2024-02-10: qty 2

## 2024-02-10 MED ORDER — LACTATED RINGERS IV BOLUS
1000.0000 mL | Freq: Once | INTRAVENOUS | Status: AC
  Administered 2024-02-10: 1000 mL via INTRAVENOUS

## 2024-02-10 MED ORDER — ONDANSETRON HCL 4 MG/2ML IJ SOLN
4.0000 mg | Freq: Once | INTRAMUSCULAR | Status: AC
  Administered 2024-02-10: 4 mg via INTRAVENOUS
  Filled 2024-02-10: qty 2

## 2024-02-10 MED ORDER — SODIUM CHLORIDE (PF) 0.9 % IJ SOLN
INTRAMUSCULAR | Status: AC
  Filled 2024-02-10: qty 50

## 2024-02-10 MED ORDER — VALPROATE SODIUM 100 MG/ML IV SOLN
500.0000 mg | Freq: Once | INTRAVENOUS | Status: AC
  Administered 2024-02-10: 500 mg via INTRAVENOUS
  Filled 2024-02-10: qty 5

## 2024-02-10 MED ORDER — DIPHENHYDRAMINE HCL 50 MG/ML IJ SOLN
25.0000 mg | Freq: Once | INTRAMUSCULAR | Status: AC
  Administered 2024-02-10: 25 mg via INTRAVENOUS
  Filled 2024-02-10: qty 1

## 2024-02-10 MED ORDER — KETOROLAC TROMETHAMINE 30 MG/ML IJ SOLN
15.0000 mg | Freq: Once | INTRAMUSCULAR | Status: AC
  Administered 2024-02-10: 15 mg via INTRAVENOUS
  Filled 2024-02-10: qty 1

## 2024-02-10 NOTE — ED Notes (Signed)
 Patient transported to CT

## 2024-02-10 NOTE — ED Triage Notes (Signed)
 Pt arrived POV. Pt reports having headache since yesterday. Pt reports taking pain meds with no relief. Pt also c/o nausea.

## 2024-02-10 NOTE — ED Provider Notes (Signed)
 [place] EMERGENCY DEPARTMENT AT [facility] Provider Note   CSN: [id number] Arrival date & time: 02/10/24  0139     History  Chief Complaint  Patient presents with   Migraine    Carrie [name] is a 53 y.o. female.  Patient presents with diffuse headache similar to previous migraines.  Associate with nausea and vomiting.  States this headache is similar to her previous migraines but more severe.  Denies thunderclap onset.  Taking Imitrex  and Fioricet  at home without relief.  Multiple episodes of nausea and vomiting today.  No fever.  No focal weakness, numbness or tingling.  Blurry vision at baseline is unchanged.  No neck pain.  No chest pain, abdominal pain, difficulty breathing.  Does have a history of migraines and this feels similar.  Other medical history includes depression, anxiety, chronic pain.  The history is provided by the patient.  Migraine Associated symptoms include headaches. Pertinent negatives include no chest pain and no shortness of breath.       Home Medications Prior to Admission medications   Medication Sig Start Date End Date Taking? Authorizing Provider  ALPRAZolam  (XANAX ) 0.5 MG tablet Take 0.5 mg by mouth 3 (three) times daily as needed for anxiety.    [provider]  aspirin  EC 81 MG tablet Take 1 tablet (81 mg total) by mouth 2 (two) times daily. To prevent blood clots for 30 days after surgery. 12/01/22   Gawne, Meghan M, PA-C  diclofenac  Sodium (VOLTAREN ) 1 % GEL Apply 1 application topically 4 (four) times daily as needed (pain).    [provider]  doxepin  (SINEQUAN ) 25 MG capsule Take 1 capsule (25 mg total) by mouth at bedtime. Patient taking differently: Take 25 mg by mouth at bedtime as needed (anxiety). 01/08/17   McVey, Claud Crumb, PA-C  ECHINACEA PO Take 1 capsule by mouth at bedtime.    [provider]  FLUoxetine  (PROZAC ) 40 MG capsule Take 40 mg by mouth daily.    [provider]   folic acid (FOLVITE) 1 MG tablet Take 1 mg by mouth at bedtime.    [provider]  gabapentin  (NEURONTIN ) 800 MG tablet Take 800 mg by mouth 3 (three) times daily.    [provider]  HYDROcodone -acetaminophen  (NORCO) 5-325 MG tablet Take 1 tablet by mouth every 6 (six) hours as needed for severe pain. 03/02/23   Molpus, John, MD  lamoTRIgine  (LAMICTAL ) 150 MG tablet Take 150 mg by mouth 2 (two) times daily. 11/16/20   [provider]  lidocaine  (XYLOCAINE ) 2 % solution Use as directed 15 mLs in the mouth or throat as needed for mouth pain. 12/12/22   Spence Dux, PA-C  naproxen  (NAPROSYN ) 375 MG tablet Take 1 tablet twice daily as needed for pain. 03/02/23   Molpus, Autry Legions, MD  ondansetron  (ZOFRAN -ODT) 4 MG disintegrating tablet Take 1 tablet (4 mg total) by mouth every 8 (eight) hours as needed for nausea or vomiting. 12/01/22   Gawne, Meghan M, PA-C  prazosin (MINIPRESS) 2 MG capsule Take 2 mg by mouth at bedtime. 11/16/20   [provider]  SUMAtriptan  (IMITREX ) 50 MG tablet Take 1 tablet (50 mg total) by mouth once for 1 dose. May repeat in 2 hours if headache persists or recurs. Patient taking differently: Take 50 mg by mouth every 2 (two) hours as needed for migraine. May repeat in 2 hours if headache persists or recurs. 09/25/17 08/08/22  McVey, Claud Crumb, PA-C  Allergies    Patient has no known allergies.    Review of Systems   Review of Systems  Constitutional:  Negative for activity change, appetite change and fever.  HENT:  Negative for congestion and rhinorrhea.   Respiratory:  Negative for cough, chest tightness and shortness of breath.   Cardiovascular:  Negative for chest pain.  Gastrointestinal:  Positive for nausea and vomiting.  Genitourinary:  Negative for dysuria and hematuria.  Musculoskeletal:  Negative for neck pain.  Skin:  Negative for rash.  Neurological:  Positive for headaches. Negative for dizziness, weakness and  light-headedness.   all other systems are negative except as noted in the HPI and PMH.    Physical Exam Updated Vital Signs BP 124/87   Pulse 66   Temp 98.1 F (36.7 C)   Resp 15   LMP 01/16/2017   SpO2 97%  Physical Exam Vitals and nursing note reviewed.  Constitutional:      General: She is not in acute distress.    Appearance: She is well-developed.  HENT:     Head: Normocephalic and atraumatic.     Mouth/Throat:     Pharynx: No oropharyngeal exudate.  Eyes:     Conjunctiva/sclera: Conjunctivae normal.     Pupils: Pupils are equal, round, and reactive to light.     Comments: Photophobic  Neck:     Comments: No meningismus. Cardiovascular:     Rate and Rhythm: Normal rate and regular rhythm.     Heart sounds: Normal heart sounds. No murmur heard. Pulmonary:     Effort: Pulmonary effort is normal. No respiratory distress.     Breath sounds: Normal breath sounds.  Abdominal:     Palpations: Abdomen is soft.     Tenderness: There is no abdominal tenderness. There is no guarding or rebound.  Musculoskeletal:        General: No tenderness. Normal range of motion.     Cervical back: Normal range of motion and neck supple.  Skin:    General: Skin is warm.  Neurological:     Mental Status: She is alert and oriented to person, place, and time.     Cranial Nerves: No cranial nerve deficit.     Motor: No abnormal muscle tone.     Coordination: Coordination normal.     Comments:  5/5 strength throughout. CN 2-12 intact.Equal grip strength.   Psychiatric:        Behavior: Behavior normal.     ED Results / Procedures / Treatments   Labs (all labs ordered are listed, but only abnormal results are displayed) Labs Reviewed  CBC WITH DIFFERENTIAL/PLATELET - Abnormal; Notable for the following components:      Result Value   Neutro Abs 8.1 (*)    All other components within normal limits  BASIC METABOLIC PANEL WITH GFR - Abnormal; Notable for the following components:    Potassium 3.0 (*)    Glucose, Bld 119 (*)    All other components within normal limits    EKG None  Radiology CT ANGIO HEAD NECK W WO CM Result Date: 02/10/2024 CLINICAL DATA:  53 year old female with neurologic deficit, persistent headache since yesterday. Nausea. EXAM: CT ANGIOGRAPHY HEAD AND NECK WITH AND WITHOUT CONTRAST TECHNIQUE: Multidetector CT imaging of the head and neck was performed using the standard protocol during bolus administration of intravenous contrast. Multiplanar CT image reconstructions and MIPs were obtained to evaluate the vascular anatomy. Carotid stenosis measurements (when applicable) are obtained utilizing NASCET criteria, using  the distal internal carotid diameter as the denominator. RADIATION DOSE REDUCTION: This exam was performed according to the departmental dose-optimization program which includes automated exposure control, adjustment of the mA and/or kV according to patient size and/or use of iterative reconstruction technique. CONTRAST:  OMNIPAQUE  IOHEXOL  350 MG/ML SOLN COMPARISON:  Head CT 11/28/2014. FINDINGS: CT HEAD Brain: Normal cerebral volume. No midline shift, ventriculomegaly, mass effect, evidence of mass lesion, intracranial hemorrhage or evidence of cortically based acute infarction. Gray-white matter differentiation is within normal limits throughout the brain. Calvarium and skull base: Negative; tiny benign left frontal bone exostosis series 8, image 16. No acute or suspicious osseous lesion. Paranasal sinuses: Tympanic cavities com Visualized paranasal sinuses and mastoids are stable and well aerated. Orbits: Visualized orbits and scalp soft tissues are within normal limits. CTA NECK Skeleton: Absent dentition. Advanced chronic cervical spine degeneration, severe disc and endplate degeneration C5-C6 and C6-C7 with associated vertebral sclerosis. Mild degenerative anterolisthesis C3-C4 and C4-C5. No acute osseous abnormality identified. Upper chest:  Emphysema. Symmetric dependent atelectasis in the visible lungs. Visible major airways are patent. Negative visible superior mediastinum. Other neck: Nonvascular neck soft tissue spaces are within normal limits. Aortic arch: Normal 3 vessel arch. Right carotid system: Negative.  No atherosclerosis or stenosis. Left carotid system: Negative. Vertebral arteries: Proximal right subclavian artery and right vertebral artery origin are normal. Proximal left subclavian artery and left vertebral artery origin are normal. Codominant vertebral arteries are mildly ectatic, widely patent to the skull base with no plaque or stenosis identified. CTA HEAD Posterior circulation: Patent distal vertebral arteries and fenestrated vertebrobasilar junction (normal variant). Patent PICA origins. Mild vertebrobasilar tortuosity. Patent basilar artery. Normal SCA and PCA origins. Posterior communicating arteries are diminutive or absent. Bilateral PCA branches are within normal limits. Anterior circulation: Both ICA siphons are patent. Symmetric cavernous sinus enhancement appears to be physiologic, related to contrast timing. No evidence of significant ICA siphon atherosclerosis or stenosis. Patent carotid termini, MCA and ACA origins. Dominant right ACA A1. Tortuous A1 segments. Normal anterior communicating artery. Bilateral ACA branches are within normal limits. Left MCA M1 segment and bifurcation are patent without stenosis. Right MCA M1 segment and bifurcation are patent without stenosis. Bilateral MCA branches are within normal limits. Venous sinuses: Abundant venous contrast on this exam, major intracranial and extracranial venous structures bilaterally are enhancing and patent. Incidentally, the right IJ is dominant. Anatomic variants: Dominant right ACA A1. Review of the MIP images confirms the above findings IMPRESSION: 1. Normal for age CTA Head and Neck. No atherosclerosis or stenosis identified. 2. Normal CT appearance of the  brain. 3. Advanced multilevel degeneration of the Cervical spine. 4. Emphysema (ICD10-J43.9). Electronically Signed   By: Marlise Simpers M.D.   On: 02/10/2024 05:06    Procedures Procedures    Medications Ordered in ED Medications  lactated ringers  bolus 1,000 mL (has no administration in time range)  metoCLOPramide  (REGLAN ) injection 10 mg (has no administration in time range)  diphenhydrAMINE  (BENADRYL ) injection 25 mg (has no administration in time range)  ketorolac  (TORADOL ) 30 MG/ML injection 15 mg (has no administration in time range)  ondansetron  (ZOFRAN ) injection 4 mg (has no administration in time range)    ED Course/ Medical Decision Making/ A&P                                 Medical Decision Making Amount and/or Complexity of Data Reviewed Labs: ordered. Decision-making  details documented in ED Course. Radiology: ordered and independent interpretation performed. Decision-making details documented in ED Course. ECG/medicine tests: ordered and independent interpretation performed. Decision-making details documented in ED Course.  Risk Prescription drug management.   Diffuse headache with nausea and vomiting typical of previous migraines.  Stable vitals.  No fever.  Neurological exam is nonfocal.  Low suspicion for subarachnoid hemorrhage, meningitis, temporal arteritis.  Patient given IV fluids and antiemetics.  Also gave Toradol  and Reglan  and Benadryl .  Headache has improved with treatment.  CT is negative for hemorrhage.  Suspect likely migraine headache.  Low suspicion for subarachnoid hemorrhage, meningitis, temporal arteritis.  She is tolerating p.o. and ambulatory.  Feels improved.  Tolerating p.o.  Discussed outpatient follow-up with his neurologist.  She has her migraine medications at home.  Return to the ED with atypical headache, unilateral weakness, numbness, tingling, difficulty speaking or difficulty swallowing or any other concerns.        Final  Clinical Impression(s) / ED Diagnoses Final diagnoses:  None    Rx / DC Orders ED Discharge Orders     None         Dela Sweeny, Mara [place], MD 02/10/24 [phone]

## 2024-02-10 NOTE — Discharge Instructions (Signed)
 Follow-up with your neurologist.  Take your medications as prescribed.  Return to the ED with new or worsening symptoms.

## 2024-03-13 ENCOUNTER — Encounter: Payer: Self-pay | Admitting: Neurology

## 2024-03-13 ENCOUNTER — Ambulatory Visit (INDEPENDENT_AMBULATORY_CARE_PROVIDER_SITE_OTHER): Admitting: Neurology

## 2024-03-13 VITALS — BP 93/60 | HR 74 | Resp 14 | Ht 68.0 in

## 2024-03-13 DIAGNOSIS — G43E09 Chronic migraine with aura, not intractable, without status migrainosus: Secondary | ICD-10-CM | POA: Diagnosis not present

## 2024-03-13 DIAGNOSIS — G44229 Chronic tension-type headache, not intractable: Secondary | ICD-10-CM | POA: Diagnosis not present

## 2024-03-13 DIAGNOSIS — M542 Cervicalgia: Secondary | ICD-10-CM | POA: Diagnosis not present

## 2024-03-13 MED ORDER — SUMATRIPTAN SUCCINATE 100 MG PO TABS
100.0000 mg | ORAL_TABLET | Freq: Once | ORAL | 5 refills | Status: AC
Start: 2024-03-13 — End: 2024-03-13

## 2024-03-13 MED ORDER — PROPRANOLOL HCL ER 60 MG PO CP24: 60.0000 mg | ORAL_CAPSULE | Freq: Every day | ORAL | 1 refills | Status: DC

## 2024-03-13 NOTE — Patient Instructions (Signed)
 Start with Propranolol 60 mg nightly  Continue with Tizanidine twice daily  Use Sumatriptan  as needed whit the headaches, can combine whit Ibuprofen   If not enough to control the headaches, please take a Fioricet  Return in 6 months for follow up
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
## 2024-03-25 ENCOUNTER — Ambulatory Visit: Attending: Neurology | Admitting: Physical Therapy

## 2024-03-25 DIAGNOSIS — M542 Cervicalgia: Secondary | ICD-10-CM | POA: Diagnosis present

## 2024-03-25 DIAGNOSIS — G43E09 Chronic migraine with aura, not intractable, without status migrainosus: Secondary | ICD-10-CM | POA: Diagnosis not present

## 2024-03-25 DIAGNOSIS — M6281 Muscle weakness (generalized): Secondary | ICD-10-CM | POA: Diagnosis present

## 2024-03-25 DIAGNOSIS — R293 Abnormal posture: Secondary | ICD-10-CM | POA: Insufficient documentation

## 2024-03-25 DIAGNOSIS — G8929 Other chronic pain: Secondary | ICD-10-CM | POA: Insufficient documentation

## 2024-03-25 DIAGNOSIS — M25511 Pain in right shoulder: Secondary | ICD-10-CM | POA: Diagnosis present

## 2024-03-25 DIAGNOSIS — M5459 Other low back pain: Secondary | ICD-10-CM | POA: Diagnosis present

## 2024-03-25 DIAGNOSIS — M25512 Pain in left shoulder: Secondary | ICD-10-CM | POA: Diagnosis present
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
## 2024-04-02 ENCOUNTER — Ambulatory Visit: Admitting: Physical Therapy

## 2024-04-02 DIAGNOSIS — M5459 Other low back pain: Secondary | ICD-10-CM

## 2024-04-02 DIAGNOSIS — R293 Abnormal posture: Secondary | ICD-10-CM

## 2024-04-02 DIAGNOSIS — M6281 Muscle weakness (generalized): Secondary | ICD-10-CM

## 2024-04-02 DIAGNOSIS — M542 Cervicalgia: Secondary | ICD-10-CM

## 2024-04-02 DIAGNOSIS — G8929 Other chronic pain: Secondary | ICD-10-CM
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
## 2024-04-08 ENCOUNTER — Ambulatory Visit: Attending: Neurology | Admitting: Physical Therapy

## 2024-04-08 ENCOUNTER — Telehealth: Payer: Self-pay | Admitting: Physical Therapy

## 2024-04-08 DIAGNOSIS — M5459 Other low back pain: Secondary | ICD-10-CM | POA: Insufficient documentation

## 2024-04-08 DIAGNOSIS — M542 Cervicalgia: Secondary | ICD-10-CM | POA: Insufficient documentation

## 2024-04-08 DIAGNOSIS — G8929 Other chronic pain: Secondary | ICD-10-CM | POA: Insufficient documentation

## 2024-04-08 DIAGNOSIS — M6281 Muscle weakness (generalized): Secondary | ICD-10-CM | POA: Insufficient documentation

## 2024-04-08 DIAGNOSIS — M25512 Pain in left shoulder: Secondary | ICD-10-CM | POA: Insufficient documentation

## 2024-04-08 DIAGNOSIS — R293 Abnormal posture: Secondary | ICD-10-CM | POA: Insufficient documentation

## 2024-04-08 DIAGNOSIS — M25511 Pain in right shoulder: Secondary | ICD-10-CM | POA: Insufficient documentation
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
## 2024-04-16 ENCOUNTER — Ambulatory Visit: Admitting: Physical Therapy

## 2024-04-16 DIAGNOSIS — G8929 Other chronic pain: Secondary | ICD-10-CM | POA: Diagnosis present

## 2024-04-16 DIAGNOSIS — M6281 Muscle weakness (generalized): Secondary | ICD-10-CM

## 2024-04-16 DIAGNOSIS — M5459 Other low back pain: Secondary | ICD-10-CM

## 2024-04-16 DIAGNOSIS — R293 Abnormal posture: Secondary | ICD-10-CM | POA: Diagnosis present

## 2024-04-16 DIAGNOSIS — M25512 Pain in left shoulder: Secondary | ICD-10-CM | POA: Diagnosis present

## 2024-04-16 DIAGNOSIS — M542 Cervicalgia: Secondary | ICD-10-CM | POA: Diagnosis present

## 2024-04-16 DIAGNOSIS — M25511 Pain in right shoulder: Secondary | ICD-10-CM | POA: Diagnosis present
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
## 2024-04-23 ENCOUNTER — Ambulatory Visit: Admitting: Physical Therapy

## 2024-04-23 ENCOUNTER — Telehealth: Payer: Self-pay | Admitting: Neurology

## 2024-04-23 DIAGNOSIS — M5459 Other low back pain: Secondary | ICD-10-CM

## 2024-04-23 DIAGNOSIS — R293 Abnormal posture: Secondary | ICD-10-CM

## 2024-04-23 DIAGNOSIS — M6281 Muscle weakness (generalized): Secondary | ICD-10-CM

## 2024-04-23 DIAGNOSIS — M542 Cervicalgia: Secondary | ICD-10-CM | POA: Diagnosis not present

## 2024-04-23 DIAGNOSIS — G8929 Other chronic pain: Secondary | ICD-10-CM

## 2024-04-23 NOTE — Telephone Encounter (Signed)
 Patient came over from rehab asking to be seen by Dr. Gregg. He prescribed propranolol  ER (INDERAL  LA) 60 MG 24 hr capsul and it helps her not have a headache throughout the night. Once she is up in the morning she gets a migrane(daily) and treats with muscle relaxer and ferocet and imitrex  as he advised. She is get dry needling and stretch therapy nextdoor which is helping a little.She is also getting dizzy spells and both ears are ringing just happens. These started last week. Patient asking for a return call,
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
## 2024-04-23 NOTE — Telephone Encounter (Signed)
 The dizziness might be from the propranolol . Please advise her to increase her water intake but if the dizziness does not improved, we will have to switch propranolol  to a different medication.

## 2024-04-30 ENCOUNTER — Ambulatory Visit: Admitting: Physical Therapy
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
## 2024-05-07 ENCOUNTER — Ambulatory Visit: Admitting: Physical Therapy
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
## 2024-05-12 ENCOUNTER — Ambulatory Visit: Admitting: Neurology

## 2024-05-14 ENCOUNTER — Ambulatory Visit: Payer: Self-pay | Admitting: Physical Therapy

## 2024-05-21 ENCOUNTER — Ambulatory Visit: Payer: Self-pay | Attending: Neurology | Admitting: Physical Therapy

## 2024-05-21 ENCOUNTER — Telehealth: Payer: Self-pay | Admitting: Physical Therapy

## 2024-05-21 DIAGNOSIS — R293 Abnormal posture: Secondary | ICD-10-CM | POA: Insufficient documentation

## 2024-05-21 DIAGNOSIS — M6281 Muscle weakness (generalized): Secondary | ICD-10-CM | POA: Insufficient documentation

## 2024-05-21 DIAGNOSIS — G8929 Other chronic pain: Secondary | ICD-10-CM | POA: Insufficient documentation

## 2024-05-21 DIAGNOSIS — M5459 Other low back pain: Secondary | ICD-10-CM | POA: Insufficient documentation

## 2024-05-21 DIAGNOSIS — M25512 Pain in left shoulder: Secondary | ICD-10-CM | POA: Insufficient documentation

## 2024-05-21 DIAGNOSIS — M25511 Pain in right shoulder: Secondary | ICD-10-CM | POA: Insufficient documentation

## 2024-05-21 DIAGNOSIS — M542 Cervicalgia: Secondary | ICD-10-CM | POA: Insufficient documentation
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
## 2024-05-22 ENCOUNTER — Ambulatory Visit: Payer: Self-pay | Admitting: Physical Therapy
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
## 2024-05-28 ENCOUNTER — Ambulatory Visit: Payer: Self-pay | Admitting: Physical Therapy

## 2024-05-28 DIAGNOSIS — R293 Abnormal posture: Secondary | ICD-10-CM | POA: Diagnosis present

## 2024-05-28 DIAGNOSIS — M25511 Pain in right shoulder: Secondary | ICD-10-CM | POA: Diagnosis present

## 2024-05-28 DIAGNOSIS — M5459 Other low back pain: Secondary | ICD-10-CM

## 2024-05-28 DIAGNOSIS — G8929 Other chronic pain: Secondary | ICD-10-CM

## 2024-05-28 DIAGNOSIS — M542 Cervicalgia: Secondary | ICD-10-CM | POA: Diagnosis present

## 2024-05-28 DIAGNOSIS — M25512 Pain in left shoulder: Secondary | ICD-10-CM | POA: Diagnosis present

## 2024-05-28 DIAGNOSIS — M6281 Muscle weakness (generalized): Secondary | ICD-10-CM | POA: Diagnosis present
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
## 2024-06-04 ENCOUNTER — Ambulatory Visit: Payer: Self-pay | Admitting: Physical Therapy

## 2024-06-06 ENCOUNTER — Ambulatory Visit: Payer: Self-pay | Admitting: Physical Therapy
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
## 2024-06-11 ENCOUNTER — Ambulatory Visit: Payer: Self-pay | Attending: Neurology | Admitting: Physical Therapy

## 2024-06-11 ENCOUNTER — Telehealth: Payer: Self-pay | Admitting: Physical Therapy
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
## 2024-06-25 ENCOUNTER — Ambulatory Visit: Payer: Self-pay | Admitting: Physical Therapy

## 2024-06-25 ENCOUNTER — Encounter: Payer: Self-pay | Admitting: Physical Therapy
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
## 2024-06-25 NOTE — Therapy (Signed)
 [facility] [address] [place], [place], [address] Phone: [phone]   Fax:  [phone]  Patient Details  Name: Carrie [name] MRN: [id number] Date of Birth: Feb 28, 1971 Referring Provider:  No ref. provider found  Encounter Date: 06/25/2024  Called patient regarding No Show for PT appointment this AM. Left a VM informing patient of missed appointment and that per attendance policy of clinic (3+ No Shows) she will be d/c from OPPT services at this time and will need to obtain a new referral to return to OPPT in the future.   Waddell Southgate, PT Waddell Southgate, PT, DPT, CSRS  06/25/2024, 10:45 AM  [facility] [address] [place], [place], [address] Phone: [phone]   Fax:  [phone]

## 2024-09-10 ENCOUNTER — Other Ambulatory Visit (HOSPITAL_COMMUNITY): Payer: Self-pay

## 2024-09-27 ENCOUNTER — Other Ambulatory Visit: Payer: Self-pay | Admitting: Neurology

## 2024-10-20 ENCOUNTER — Encounter: Payer: Self-pay | Admitting: Neurology

## 2024-10-20 ENCOUNTER — Ambulatory Visit: Admitting: Neurology
